# Patient Record
Sex: Female | Born: 1935 | Race: White | Hispanic: No | Smoking: Never smoker
Health system: Southern US, Community
[De-identification: ages and names within clinical notes are randomized; demographics above are authoritative.]

## PROBLEM LIST (undated history)

## (undated) DIAGNOSIS — E119 Type 2 diabetes mellitus without complications: Secondary | ICD-10-CM

## (undated) DIAGNOSIS — J439 Emphysema, unspecified: Secondary | ICD-10-CM

## (undated) DIAGNOSIS — E079 Disorder of thyroid, unspecified: Secondary | ICD-10-CM

## (undated) DIAGNOSIS — N390 Urinary tract infection, site not specified: Secondary | ICD-10-CM

## (undated) DIAGNOSIS — I10 Essential (primary) hypertension: Secondary | ICD-10-CM

## (undated) HISTORY — PX: REPLACEMENT TOTAL KNEE: SUR1224

## (undated) HISTORY — DX: Emphysema, unspecified: J43.9

## (undated) HISTORY — DX: Disorder of thyroid, unspecified: E07.9

---

## 1997-09-08 ENCOUNTER — Emergency Department (HOSPITAL_COMMUNITY): Admission: EM | Admit: 1997-09-08 | Discharge: 1997-09-08 | Payer: Self-pay | Admitting: Emergency Medicine

## 1998-04-15 ENCOUNTER — Encounter: Payer: Self-pay | Admitting: Family Medicine

## 1998-04-15 ENCOUNTER — Ambulatory Visit (HOSPITAL_COMMUNITY): Admission: RE | Admit: 1998-04-15 | Discharge: 1998-04-15 | Payer: Self-pay | Admitting: Family Medicine

## 1998-12-03 ENCOUNTER — Emergency Department (HOSPITAL_COMMUNITY): Admission: EM | Admit: 1998-12-03 | Discharge: 1998-12-03 | Payer: Self-pay | Admitting: *Deleted

## 1999-05-12 ENCOUNTER — Emergency Department (HOSPITAL_COMMUNITY): Admission: EM | Admit: 1999-05-12 | Discharge: 1999-05-12 | Payer: Self-pay

## 1999-05-14 ENCOUNTER — Emergency Department (HOSPITAL_COMMUNITY): Admission: EM | Admit: 1999-05-14 | Discharge: 1999-05-14 | Payer: Self-pay | Admitting: Emergency Medicine

## 1999-07-09 ENCOUNTER — Emergency Department (HOSPITAL_COMMUNITY): Admission: EM | Admit: 1999-07-09 | Discharge: 1999-07-09 | Payer: Self-pay | Admitting: Emergency Medicine

## 1999-07-09 ENCOUNTER — Encounter: Payer: Self-pay | Admitting: Emergency Medicine

## 2000-01-09 ENCOUNTER — Encounter: Payer: Self-pay | Admitting: Internal Medicine

## 2000-01-09 ENCOUNTER — Emergency Department (HOSPITAL_COMMUNITY): Admission: EM | Admit: 2000-01-09 | Discharge: 2000-01-09 | Payer: Self-pay | Admitting: Internal Medicine

## 2000-01-20 ENCOUNTER — Emergency Department (HOSPITAL_COMMUNITY): Admission: EM | Admit: 2000-01-20 | Discharge: 2000-01-20 | Payer: Self-pay | Admitting: *Deleted

## 2000-08-12 ENCOUNTER — Emergency Department (HOSPITAL_COMMUNITY): Admission: EM | Admit: 2000-08-12 | Discharge: 2000-08-12 | Payer: Self-pay | Admitting: Emergency Medicine

## 2000-12-03 ENCOUNTER — Emergency Department (HOSPITAL_COMMUNITY): Admission: EM | Admit: 2000-12-03 | Discharge: 2000-12-03 | Payer: Self-pay | Admitting: Emergency Medicine

## 2001-01-26 ENCOUNTER — Encounter: Payer: Self-pay | Admitting: Emergency Medicine

## 2001-01-26 ENCOUNTER — Emergency Department (HOSPITAL_COMMUNITY): Admission: EM | Admit: 2001-01-26 | Discharge: 2001-01-26 | Payer: Self-pay | Admitting: *Deleted

## 2001-09-27 ENCOUNTER — Emergency Department (HOSPITAL_COMMUNITY): Admission: EM | Admit: 2001-09-27 | Discharge: 2001-09-27 | Payer: Self-pay | Admitting: Emergency Medicine

## 2001-09-27 ENCOUNTER — Encounter: Payer: Self-pay | Admitting: Emergency Medicine

## 2001-10-26 ENCOUNTER — Ambulatory Visit (HOSPITAL_COMMUNITY): Admission: RE | Admit: 2001-10-26 | Discharge: 2001-10-26 | Payer: Self-pay | Admitting: Family Medicine

## 2002-04-07 ENCOUNTER — Encounter: Payer: Self-pay | Admitting: Emergency Medicine

## 2002-04-07 ENCOUNTER — Emergency Department (HOSPITAL_COMMUNITY): Admission: EM | Admit: 2002-04-07 | Discharge: 2002-04-07 | Payer: Self-pay | Admitting: Emergency Medicine

## 2003-03-30 ENCOUNTER — Emergency Department (HOSPITAL_COMMUNITY): Admission: EM | Admit: 2003-03-30 | Discharge: 2003-03-30 | Payer: Self-pay | Admitting: Emergency Medicine

## 2005-12-14 ENCOUNTER — Emergency Department (HOSPITAL_COMMUNITY): Admission: EM | Admit: 2005-12-14 | Discharge: 2005-12-14 | Payer: Self-pay | Admitting: Emergency Medicine

## 2006-02-01 ENCOUNTER — Emergency Department (HOSPITAL_COMMUNITY): Admission: EM | Admit: 2006-02-01 | Discharge: 2006-02-01 | Payer: Self-pay | Admitting: Emergency Medicine

## 2007-03-14 ENCOUNTER — Ambulatory Visit: Payer: Self-pay | Admitting: Cardiology

## 2007-03-14 ENCOUNTER — Inpatient Hospital Stay (HOSPITAL_COMMUNITY): Admission: EM | Admit: 2007-03-14 | Discharge: 2007-03-15 | Payer: Self-pay | Admitting: Emergency Medicine

## 2007-03-15 ENCOUNTER — Encounter (INDEPENDENT_AMBULATORY_CARE_PROVIDER_SITE_OTHER): Payer: Self-pay | Admitting: Internal Medicine

## 2008-03-20 ENCOUNTER — Emergency Department (HOSPITAL_COMMUNITY): Admission: EM | Admit: 2008-03-20 | Discharge: 2008-03-20 | Payer: Self-pay | Admitting: Emergency Medicine

## 2008-06-08 ENCOUNTER — Encounter: Admission: RE | Admit: 2008-06-08 | Discharge: 2008-06-08 | Payer: Self-pay | Admitting: Family Medicine

## 2008-10-12 ENCOUNTER — Encounter: Admission: RE | Admit: 2008-10-12 | Discharge: 2008-10-12 | Payer: Self-pay | Admitting: Orthopedic Surgery

## 2008-11-21 ENCOUNTER — Ambulatory Visit (HOSPITAL_COMMUNITY): Admission: RE | Admit: 2008-11-21 | Discharge: 2008-11-21 | Payer: Self-pay | Admitting: Orthopedic Surgery

## 2008-12-31 ENCOUNTER — Encounter: Admission: RE | Admit: 2008-12-31 | Discharge: 2009-03-11 | Payer: Self-pay | Admitting: Orthopedic Surgery

## 2009-10-24 ENCOUNTER — Inpatient Hospital Stay (HOSPITAL_COMMUNITY): Admission: RE | Admit: 2009-10-24 | Discharge: 2009-10-31 | Payer: Self-pay | Admitting: Orthopedic Surgery

## 2009-12-10 ENCOUNTER — Encounter: Admission: RE | Admit: 2009-12-10 | Discharge: 2009-12-25 | Payer: Self-pay | Admitting: Orthopedic Surgery

## 2010-04-21 ENCOUNTER — Encounter: Payer: Self-pay | Admitting: Orthopedic Surgery

## 2010-06-13 LAB — GLUCOSE, CAPILLARY
Glucose-Capillary: 117 mg/dL — ABNORMAL HIGH (ref 70–99)
Glucose-Capillary: 136 mg/dL — ABNORMAL HIGH (ref 70–99)
Glucose-Capillary: 141 mg/dL — ABNORMAL HIGH (ref 70–99)
Glucose-Capillary: 145 mg/dL — ABNORMAL HIGH (ref 70–99)
Glucose-Capillary: 147 mg/dL — ABNORMAL HIGH (ref 70–99)
Glucose-Capillary: 148 mg/dL — ABNORMAL HIGH (ref 70–99)
Glucose-Capillary: 150 mg/dL — ABNORMAL HIGH (ref 70–99)
Glucose-Capillary: 169 mg/dL — ABNORMAL HIGH (ref 70–99)
Glucose-Capillary: 186 mg/dL — ABNORMAL HIGH (ref 70–99)

## 2010-06-13 LAB — CBC
HCT: 28.2 % — ABNORMAL LOW (ref 36.0–46.0)
Hemoglobin: 10 g/dL — ABNORMAL LOW (ref 12.0–15.0)
MCH: 32.9 pg (ref 26.0–34.0)
MCHC: 35.5 g/dL (ref 30.0–36.0)
MCV: 92.8 fL (ref 78.0–100.0)
Platelets: 221 10*3/uL (ref 150–400)
RBC: 3.04 MIL/uL — ABNORMAL LOW (ref 3.87–5.11)
RDW: 12.8 % (ref 11.5–15.5)
WBC: 6.7 10*3/uL (ref 4.0–10.5)

## 2010-06-13 LAB — COMPREHENSIVE METABOLIC PANEL
ALT: 20 U/L (ref 0–35)
AST: 18 U/L (ref 0–37)
Albumin: 2.8 g/dL — ABNORMAL LOW (ref 3.5–5.2)
Alkaline Phosphatase: 48 U/L (ref 39–117)
BUN: 12 mg/dL (ref 6–23)
CO2: 32 mEq/L (ref 19–32)
Calcium: 8.7 mg/dL (ref 8.4–10.5)
Chloride: 100 mEq/L (ref 96–112)
Creatinine, Ser: 0.77 mg/dL (ref 0.4–1.2)
GFR calc Af Amer: >60 mL/min (ref >60)
GFR calc non Af Amer: >60 mL/min (ref >60)
Glucose, Bld: 154 mg/dL — ABNORMAL HIGH (ref 70–99)
Potassium: 4.1 mEq/L (ref 3.5–5.1)
Sodium: 138 mEq/L (ref 135–145)
Total Bilirubin: 0.7 mg/dL (ref 0.3–1.2)
Total Protein: 6.3 g/dL (ref 6.0–8.3)

## 2010-06-13 LAB — MAGNESIUM: Magnesium: 2.3 mg/dL (ref 1.5–2.5)

## 2010-06-13 LAB — PROTIME-INR
INR: 1.51 — ABNORMAL HIGH (ref 0.00–1.49)
INR: 1.75 — ABNORMAL HIGH (ref 0.00–1.49)
INR: 1.97 — ABNORMAL HIGH (ref 0.00–1.49)
Prothrombin Time: 18.1 seconds — ABNORMAL HIGH (ref 11.6–15.2)
Prothrombin Time: 20.3 seconds — ABNORMAL HIGH (ref 11.6–15.2)

## 2010-06-14 LAB — CBC
HCT: 31.1 % — ABNORMAL LOW (ref 36.0–46.0)
Hemoglobin: 10.4 g/dL — ABNORMAL LOW (ref 12.0–15.0)
Hemoglobin: 14.1 g/dL (ref 12.0–15.0)
MCH: 32 pg (ref 26.0–34.0)
MCH: 32.1 pg (ref 26.0–34.0)
MCH: 32.3 pg (ref 26.0–34.0)
MCHC: 34.2 g/dL (ref 30.0–36.0)
MCHC: 35 g/dL (ref 30.0–36.0)
MCV: 92.9 fL (ref 78.0–100.0)
Platelets: 160 10*3/uL (ref 150–400)
Platelets: 180 10*3/uL (ref 150–400)
RBC: 3.25 MIL/uL — ABNORMAL LOW (ref 3.87–5.11)
RBC: 4.37 MIL/uL (ref 3.87–5.11)
RDW: 12.6 % (ref 11.5–15.5)
RDW: 12.9 % (ref 11.5–15.5)
WBC: 7.6 10*3/uL (ref 4.0–10.5)

## 2010-06-14 LAB — URINE MICROSCOPIC-ADD ON

## 2010-06-14 LAB — URINALYSIS, ROUTINE W REFLEX MICROSCOPIC
Bilirubin Urine: NEGATIVE
Ketones, ur: NEGATIVE mg/dL
Nitrite: NEGATIVE
Protein, ur: NEGATIVE mg/dL
pH: 7 (ref 5.0–8.0)

## 2010-06-14 LAB — COMPREHENSIVE METABOLIC PANEL
ALT: 17 U/L (ref 0–35)
AST: 21 U/L (ref 0–37)
AST: 22 U/L (ref 0–37)
AST: 23 U/L (ref 0–37)
Albumin: 3.1 g/dL — ABNORMAL LOW (ref 3.5–5.2)
Albumin: 3.1 g/dL — ABNORMAL LOW (ref 3.5–5.2)
Albumin: 3.2 g/dL — ABNORMAL LOW (ref 3.5–5.2)
Alkaline Phosphatase: 45 U/L (ref 39–117)
Alkaline Phosphatase: 56 U/L (ref 39–117)
BUN: 6 mg/dL (ref 6–23)
BUN: 9 mg/dL (ref 6–23)
CO2: 30 mEq/L (ref 19–32)
CO2: 30 mEq/L (ref 19–32)
CO2: 34 mEq/L — ABNORMAL HIGH (ref 19–32)
Calcium: 8.7 mg/dL (ref 8.4–10.5)
Chloride: 100 mEq/L (ref 96–112)
Chloride: 104 mEq/L (ref 96–112)
Chloride: 97 mEq/L (ref 96–112)
Creatinine, Ser: 0.79 mg/dL (ref 0.4–1.2)
Creatinine, Ser: 0.8 mg/dL (ref 0.4–1.2)
GFR calc Af Amer: >60 mL/min (ref >60)
GFR calc Af Amer: >60 mL/min (ref >60)
GFR calc Af Amer: >60 mL/min (ref >60)
GFR calc non Af Amer: 58 mL/min — ABNORMAL LOW (ref >60)
GFR calc non Af Amer: >60 mL/min (ref >60)
GFR calc non Af Amer: >60 mL/min (ref >60)
Potassium: 3.3 mEq/L — ABNORMAL LOW (ref 3.5–5.1)
Sodium: 140 mEq/L (ref 135–145)
Total Bilirubin: 0.7 mg/dL (ref 0.3–1.2)
Total Bilirubin: 0.8 mg/dL (ref 0.3–1.2)
Total Bilirubin: 0.8 mg/dL (ref 0.3–1.2)
Total Protein: 6.7 g/dL (ref 6.0–8.3)

## 2010-06-14 LAB — GLUCOSE, CAPILLARY
Glucose-Capillary: 149 mg/dL — ABNORMAL HIGH (ref 70–99)
Glucose-Capillary: 165 mg/dL — ABNORMAL HIGH (ref 70–99)
Glucose-Capillary: 170 mg/dL — ABNORMAL HIGH (ref 70–99)
Glucose-Capillary: 170 mg/dL — ABNORMAL HIGH (ref 70–99)
Glucose-Capillary: 172 mg/dL — ABNORMAL HIGH (ref 70–99)
Glucose-Capillary: 173 mg/dL — ABNORMAL HIGH (ref 70–99)
Glucose-Capillary: 175 mg/dL — ABNORMAL HIGH (ref 70–99)
Glucose-Capillary: 223 mg/dL — ABNORMAL HIGH (ref 70–99)

## 2010-06-14 LAB — DIFFERENTIAL
Basophils Absolute: 0 10*3/uL (ref 0.0–0.1)
Basophils Relative: 0 % (ref 0–1)
Eosinophils Relative: 1 % (ref 0–5)
Eosinophils Relative: 1 % (ref 0–5)
Lymphocytes Relative: 25 % (ref 12–46)
Lymphs Abs: 1.6 10*3/uL (ref 0.7–4.0)
Monocytes Absolute: 0.9 10*3/uL (ref 0.1–1.0)
Monocytes Relative: 10 % (ref 3–12)

## 2010-06-14 LAB — PROTIME-INR
INR: 1.19 (ref 0.00–1.49)
Prothrombin Time: 15 seconds (ref 11.6–15.2)

## 2010-06-14 LAB — MAGNESIUM
Magnesium: 1.9 mg/dL (ref 1.5–2.5)
Magnesium: 2.1 mg/dL (ref 1.5–2.5)

## 2010-06-14 LAB — SURGICAL PCR SCREEN: MRSA, PCR: NEGATIVE

## 2010-07-05 LAB — BASIC METABOLIC PANEL
CO2: 31 mEq/L (ref 19–32)
Glucose, Bld: 105 mg/dL — ABNORMAL HIGH (ref 70–99)
Potassium: 3.9 mEq/L (ref 3.5–5.1)
Sodium: 142 mEq/L (ref 135–145)

## 2010-07-05 LAB — COMPREHENSIVE METABOLIC PANEL
AST: 22 U/L (ref 0–37)
BUN: 7 mg/dL (ref 6–23)
CO2: 31 mEq/L (ref 19–32)
Chloride: 103 mEq/L (ref 96–112)
Creatinine, Ser: 0.81 mg/dL (ref 0.4–1.2)
GFR calc Af Amer: >60 mL/min (ref >60)
GFR calc non Af Amer: >60 mL/min (ref >60)
Total Bilirubin: 1 mg/dL (ref 0.3–1.2)

## 2010-07-05 LAB — CBC
HCT: 38.5 % (ref 36.0–46.0)
Hemoglobin: 13.2 g/dL (ref 12.0–15.0)
MCHC: 34.3 g/dL (ref 30.0–36.0)
RDW: 12.9 % (ref 11.5–15.5)

## 2010-08-12 NOTE — Op Note (Signed)
NAMEDEVONA, HOLMES NO.:  [id number]   MEDICAL RECORD NO.:  [id number]          PATIENT TYPE:  AMB   LOCATION:  SDS                          FACILITY:  [facility]   PHYSICIAN:  Myrtie Neither, MD      DATE OF BIRTH:  Aug 16, 1934   DATE OF PROCEDURE:  11/21/2008  DATE OF DISCHARGE:  11/21/2008                               OPERATIVE REPORT   PREOPERATIVE DIAGNOSIS:  Lateral meniscal tear left knee.   POSTOPERATIVE DIAGNOSIS:  Lateral meniscal tear left knee, patellar  plica left knee, chondral defect lateral femoral condyle.   ANESTHESIA:  General.   PROCEDURE:  1. Arthroscopic partial lateral meniscectomy.  2. Excision of plica and synovectomy medial and lateral compartment.  3. Chondroplasty of lateral femoral condyle.   DESCRIPTION:  The patient was taken to the operating room after given  adequate preop medications, given general anesthesia and intubated.  The  left knee was prepped with DuraPrep and draped in a sterile manner.  Tourniquet used for hemostasis.  One half inch puncture wound was made  along the anterior medial and lateral joint line.  Inflow was through  the medial suprapatellar pouch area.  Inspection of the joint revealed  degenerative tear of the lateral meniscus along the posterior horn all  the way round to the lateral aspect of the lateral meniscus.  There was  a chondral defect approximately the size of a five-cent piece involving  the lateral femoral condyle.  There was a large patellar plica with  thickening of the synovial lining both in the medial and lateral  compartments.  With the use of synovial shaver, an excision of plica and  complete synovectomy was done followed by use of basket forceps for the  lateral meniscectomy.  Chondroplasty with a shaver was done of the  lateral femoral condyle.  ACL was intact and medial compartment was well  preserved.  Further copious irrigation was done.  Wound closure was done  with 4-0 nylon.   14 mL of plain Marcaine 0.25% was injected into the  knee.  Compressive dressing was applied.  The patient tolerated the  procedure quite well and went to recovery room in stable and  satisfactory condition.  The patient being discharged home on Percocet 1-  2 q.6 p.r.n. for pain, ice packs, crutches with partial weightbearing on  the left side and to return to the office in 1 week.  The patient being  discharged in stable and satisfactory condition.      Myrtie Neither, MD  Electronically Signed     AC/MEDQ  D:  11/21/2008  T:  11/22/2008  Job:  [id number]

## 2010-08-12 NOTE — Discharge Summary (Signed)
[name], [name] NO.:  [id number]   MEDICAL RECORD NO.:  [id number]          PATIENT TYPE:  INP   LOCATION:  4735                         FACILITY:  [facility]   PHYSICIAN:  Lonia Blood, M.D.       DATE OF BIRTH:  06/28/2004   DATE OF ADMISSION:  03/14/2007  DATE OF DISCHARGE:                               DISCHARGE SUMMARY   PRIMARY CARE PHYSICIAN:  Renaye Rakers, M.D.   DISCHARGE DIAGNOSIS:  1. Syncope most likely secondary to orthostasis.  2. Acute bronchitis with hyperreactive airways.  3. Status post hysterectomy.  4. Newly diagnosed diabetes mellitus type 2.  5. Hyperlipidemia.  6. Obesity.   DISCHARGE MEDICATIONS:  1. Albuterol 1 puff 3 times a day.  2. Doxycycline 100 mg daily.  3. Prilosec OTC 20 mg daily.   CONDITION ON DISCHARGE:  [name] was discharge in good condition.  She  was instructed to follow-up a diabetic diet.  She instructed to follow  up with her primary care physician as previously scheduled.  The patient  was provided with a prescription for Glucometer and she was set up with  diabetes education.   PROCEDURE THIS ADMISSION:  A transthoracic echocardiogram was done,  results pending.  CT of the head was done with negative results.   CONSULTATION:  No consultations obtained.   HISTORY AND PHYSICAL:  Refer to dictated H&P done by Dr. Sharon Seller.   HOSPITAL COURSE:  1. Syncope.  [name] was observed for 24 hours on telemetry and she      had no arrhythmias.  Her orthostasis has resolved completely.  The      dehydration has resolved completely.  The patient did not have any      further syncopal events and her cardiac enzymes were within normal      limits.  She did not display signs of a stroke.  2. Newly diagnosed diabetes mellitus.  This seems to be quite mild.      The patient's measured hemoglobin A1c 6.7.  She will follow up with      her primary care physician in 2 weeks to decide if she requires      treatment with  medications or if she should just follow a diet      alone.  The patient was set up with outpatient education as well as      with home Glucometer.  3. Hyperreactive airways. [name] is suffering from acute bronchitis      with hyperreactive airways.  She does not have pneumonia.  She      needs to be treated with albuterol MDIs as well as antibiotics.      She is to follow up with her primary care physician for pulmonary      function testing and treatment as needed.      Lonia Blood, M.D.  Electronically Signed     SL/MEDQ  D:  03/15/2007  T:  03/16/2007  Job:  [id number]   cc:   Renaye Rakers, M.D.

## 2010-08-12 NOTE — H&P (Signed)
[name], GAME NO.:  [id number]   MEDICAL RECORD NO.:  [id number]          PATIENT TYPE:  EMS   LOCATION:  MAJO                         FACILITY:  [facility]   PHYSICIAN:  Lonia Blood, M.D.DATE OF BIRTH:  October 28, 1934   DATE OF ADMISSION:  03/14/2007  DATE OF DISCHARGE:                              HISTORY & PHYSICAL   PRIMARY CARE PHYSICIAN:  Renaye Rakers, M.D.   CHIEF COMPLAINT:  Syncope.   HISTORY OF PRESENT ILLNESS:  [name] is a very pleasant 76 year old  female with a remarkably clean past medical history.  Approximately one  week ago, she was diagnosed with community-acquired pneumonia in her  primary care physician's office.  The patient was treated for this with  a p.o. antibiotic, whose full course she has completed.  Over the  weekend, she recuperated and felt that she was much improved.  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  Upon arriving at work, she began to feel somewhat  weak in general.  While working this week, this worsened.  The patient  began to feel hot and diaphoretic.  She went to the restroom to  recuperate and shortly thereafter, she was found passed out in the floor  by her coworkers.  The patient had no prodromal symptoms.  We are not  sure how long the patient was out, but there is no indication that it  was an extended period of time.  There are no findings nor is there  historical evidence to suggest the patient suffered a seizure-type  activity.  Upon awakening, the patient immediately knew where she was.  She has had no chest pain, nausea, vomiting, or abdominal pain since the  onset of her symptoms.  She does admit to feeling warm on weekends but  does say that she was pushing liquids and solids despite this.  She was  transported to the emergency room at [facility], where she was found to  be mildly febrile but otherwise has been medically stable.  She has no  complaints at the present time other than feeling tired.   REVIEW OF SYSTEMS:  Comprehensive review of systems is unremarkable.   PAST MEDICAL HISTORY:  1. Status post hysterectomy.  2. History of left arm fracture, status post fall, requiring inpatient      treatment multiple years ago.  3. No chronic medical diagnoses.   OUTPATIENT MEDICATIONS:  None.   ALLERGIES:  ASPIRIN causes GI upset.   FAMILY HISTORY:  Reviewed but is unremarkable.   SOCIAL HISTORY:  Patient does not smoke.  She does not drink.  She lives  in [place].  She lives with her children.  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   REVIEW OF SYSTEMS:  Patient did not get a flu shot this year.   DATA REVIEWED:  CBC is unremarkable.  BMET is unremarkable except for  elevated BUN at 22 and an elevated serum glucose at 146.   CT scan of the head reveals  no acute disease.  CT scan of the left  shoulder reveals no acute disease.  Chest x-ray reveals no focal  infiltrate.   PHYSICAL EXAMINATION:  Temperature 98.5, blood pressure 118/58, heart  rate 72, respiratory rate 20, O2 sat is 94% on room air.  GENERAL:  A well-developed and well-nourished female in no acute  respiratory distress.  HEENT:  Normocephalic and atraumatic.  Pupils are equal, round and  reactive to light and accommodation.  Extraocular muscles are intact  bilaterally.  OC/OP clear.  NECK:  No JVD.  No lymphadenopathy.  No thyromegaly.  LUNGS:  Clear to auscultation throughout all fields without focal  wheezes or crackles.  CARDIOVASCULAR:  Regular rate and rhythm without murmur, rub or gallop  with normal S1 and S2.  ABDOMEN:  Nontender, nondistended.  Soft.  Bowel sounds present.  No  hepatosplenomegaly.  No rebound.  No ascites.  EXTREMITIES:  No significant clubbing, cyanosis or edema in bilateral  lower extremities.  NEUROLOGIC:  Alert and oriented x4.  Cranial nerves II-XII are intact  bilaterally.  Strength is  5/5 in bilateral upper and lower extremities.  Intact sensation to touch throughout.  No Babinski's.   IMPRESSION/PLAN:  1. Syncope:  This likely represented an orthostatic or even vasovagal-      type syncope.  The patient appears to be suffering with a viral      syndrome at the present time.  The low grade fever that has been      associated with this has likely led to significant and sensitive      fluid loss and has resulted in her clinically apparent dehydration.      I feel this is to blame for the patient's syncope.  We will rule      the patient out as a precaution.  I will check an echocardiogram to      evaluate her LV systolic function.  Further evaluation will be      carried out as appropriate.  We will hydrate the patient      aggressively and follow her clinically.  Orthostatics will be      obtained in the morning.  2. Upper respiratory symptoms:  If the patient had a pneumonia last      week, it has been completely treated with her antibiotic.  Chest x-      ray is now completely clean.  My concern is that this could      represent a viral upper respiratory picture versus even an      influenza.  We will do a nasal swab for influenza.  We will hydrate      the patient aggressively.  We will use nebulizer to attempt to      assist with her cough.  3. Hyperglycemia:  Patient has no history of diabetes.  She is      somewhat hyperglycemic with a serum glucose of 146.  We will check      a hemoglobin A1C and will pursue this further as indicated.      Lonia Blood, M.D.  Electronically Signed     JTM/MEDQ  D:  03/14/2007  T:  03/14/2007  Job:  [id number]   cc:   Renaye Rakers, M.D.

## 2011-01-02 LAB — POCT I-STAT CREATININE
Creatinine, Ser: 0.9
Operator id: 151321

## 2011-01-02 LAB — LIPID PANEL
Cholesterol: 168
HDL: 28 — ABNORMAL LOW
Total CHOL/HDL Ratio: 6

## 2011-01-02 LAB — BASIC METABOLIC PANEL
CO2: 27
Calcium: 8.5
Chloride: 108
Creatinine, Ser: 0.81
Glucose, Bld: 125 — ABNORMAL HIGH
Sodium: 141

## 2011-01-02 LAB — URINE MICROSCOPIC-ADD ON

## 2011-01-02 LAB — HEMOGLOBIN A1C
Hgb A1c MFr Bld: 6.7 — ABNORMAL HIGH
Mean Plasma Glucose: 161

## 2011-01-02 LAB — CBC
MCHC: 34.4
Platelets: 254
RBC: 4.21
RDW: 12.9

## 2011-01-02 LAB — URINALYSIS, ROUTINE W REFLEX MICROSCOPIC
Glucose, UA: NEGATIVE
Hgb urine dipstick: NEGATIVE
Ketones, ur: NEGATIVE
Protein, ur: NEGATIVE
pH: 7

## 2011-01-02 LAB — DIFFERENTIAL
Basophils Absolute: 0.1
Basophils Relative: 1
Monocytes Relative: 6
Neutro Abs: 6.5
Neutrophils Relative %: 75

## 2011-01-02 LAB — MAGNESIUM: Magnesium: 2.3

## 2011-01-02 LAB — CARDIAC PANEL(CRET KIN+CKTOT+MB+TROPI)
Relative Index: INVALID
Total CK: 82
Total CK: 88
Troponin I: 0.01

## 2011-01-02 LAB — I-STAT 8, (EC8 V) (CONVERTED LAB)
Acid-Base Excess: 6 — ABNORMAL HIGH
Bicarbonate: 28.4 — ABNORMAL HIGH
HCT: 40
Operator id: 151321
TCO2: 29
pCO2, Ven: 31.7 — ABNORMAL LOW

## 2011-01-02 LAB — URINE CULTURE: Colony Count: 25000

## 2012-06-09 ENCOUNTER — Encounter (HOSPITAL_COMMUNITY): Payer: Self-pay | Admitting: Emergency Medicine

## 2012-06-09 ENCOUNTER — Inpatient Hospital Stay (HOSPITAL_COMMUNITY)
Admission: EM | Admit: 2012-06-09 | Discharge: 2012-06-12 | DRG: 690 | Disposition: A | Discharge status: Home Health | Payer: Medicare HMO | Source: Ambulatory Visit | Attending: Internal Medicine | Admitting: Internal Medicine

## 2012-06-09 DIAGNOSIS — Z886 Allergy status to analgesic agent status: Secondary | ICD-10-CM

## 2012-06-09 DIAGNOSIS — E860 Dehydration: Secondary | ICD-10-CM | POA: Diagnosis present

## 2012-06-09 DIAGNOSIS — N390 Urinary tract infection, site not specified: Secondary | ICD-10-CM | POA: Diagnosis present

## 2012-06-09 DIAGNOSIS — Z96659 Presence of unspecified artificial knee joint: Secondary | ICD-10-CM

## 2012-06-09 DIAGNOSIS — I10 Essential (primary) hypertension: Secondary | ICD-10-CM | POA: Diagnosis present

## 2012-06-09 DIAGNOSIS — Z792 Long term (current) use of antibiotics: Secondary | ICD-10-CM

## 2012-06-09 DIAGNOSIS — Z79899 Other long term (current) drug therapy: Secondary | ICD-10-CM

## 2012-06-09 DIAGNOSIS — R5381 Other malaise: Secondary | ICD-10-CM | POA: Diagnosis present

## 2012-06-09 DIAGNOSIS — N10 Acute tubulo-interstitial nephritis: Principal | ICD-10-CM | POA: Diagnosis present

## 2012-06-09 DIAGNOSIS — E119 Type 2 diabetes mellitus without complications: Secondary | ICD-10-CM | POA: Diagnosis present

## 2012-06-09 HISTORY — DX: Essential (primary) hypertension: I10

## 2012-06-09 HISTORY — DX: Urinary tract infection, site not specified: N39.0

## 2012-06-09 HISTORY — DX: Type 2 diabetes mellitus without complications: E11.9

## 2012-06-09 LAB — CBC WITH DIFFERENTIAL/PLATELET
Eosinophils Absolute: 0 10*3/uL (ref 0.0–0.7)
Hemoglobin: 12.5 g/dL (ref 12.0–15.0)
Lymphs Abs: 0.7 10*3/uL (ref 0.7–4.0)
MCH: 31.2 pg (ref 26.0–34.0)
Monocytes Relative: 12 % (ref 3–12)
Neutro Abs: 8.3 10*3/uL — ABNORMAL HIGH (ref 1.7–7.7)
Neutrophils Relative %: 81 % — ABNORMAL HIGH (ref 43–77)
Platelets: 152 10*3/uL (ref 150–400)
RBC: 4.01 MIL/uL (ref 3.87–5.11)
WBC: 10.3 10*3/uL (ref 4.0–10.5)

## 2012-06-09 LAB — GLUCOSE, CAPILLARY
Glucose-Capillary: 125 mg/dL — ABNORMAL HIGH (ref 70–99)
Glucose-Capillary: 141 mg/dL — ABNORMAL HIGH (ref 70–99)

## 2012-06-09 LAB — CBC
HCT: 33.7 % — ABNORMAL LOW (ref 36.0–46.0)
Hemoglobin: 11.7 g/dL — ABNORMAL LOW (ref 12.0–15.0)
MCH: 31.7 pg (ref 26.0–34.0)
MCHC: 34.7 g/dL (ref 30.0–36.0)
MCV: 91.3 fL (ref 78.0–100.0)
RBC: 3.69 MIL/uL — ABNORMAL LOW (ref 3.87–5.11)

## 2012-06-09 LAB — URINE MICROSCOPIC-ADD ON

## 2012-06-09 LAB — COMPREHENSIVE METABOLIC PANEL
ALT: 29 U/L (ref 0–35)
Albumin: 3.5 g/dL (ref 3.5–5.2)
Alkaline Phosphatase: 79 U/L (ref 39–117)
BUN: 19 mg/dL (ref 6–23)
Chloride: 96 mEq/L (ref 96–112)
GFR calc Af Amer: 75 mL/min — ABNORMAL LOW (ref >90)
Glucose, Bld: 158 mg/dL — ABNORMAL HIGH (ref 70–99)
Potassium: 3.7 mEq/L (ref 3.5–5.1)
Sodium: 134 mEq/L — ABNORMAL LOW (ref 135–145)
Total Bilirubin: 1.4 mg/dL — ABNORMAL HIGH (ref 0.3–1.2)
Total Protein: 7.6 g/dL (ref 6.0–8.3)

## 2012-06-09 LAB — URINALYSIS, ROUTINE W REFLEX MICROSCOPIC
Glucose, UA: 100 mg/dL — AB
Ketones, ur: >80 mg/dL — AB
Protein, ur: 100 mg/dL — AB
Urobilinogen, UA: 1 mg/dL (ref 0.0–1.0)

## 2012-06-09 LAB — HEMOGLOBIN A1C
Hgb A1c MFr Bld: 7 % — ABNORMAL HIGH (ref <5.7)
Mean Plasma Glucose: 154 mg/dL — ABNORMAL HIGH (ref <117)

## 2012-06-09 MED ORDER — ONDANSETRON HCL 4 MG/2ML IJ SOLN: 4.0000 mg | Freq: Four times a day (QID) | INTRAMUSCULAR | Status: DC | PRN

## 2012-06-09 MED ORDER — HYDROCODONE-ACETAMINOPHEN 5-325 MG PO TABS
1.0000 | ORAL_TABLET | ORAL | Status: DC | PRN
  Administered 2012-06-10: 1 via ORAL
  Filled 2012-06-09: qty 1

## 2012-06-09 MED ORDER — HYDROCHLOROTHIAZIDE 12.5 MG PO CAPS
12.5000 mg | ORAL_CAPSULE | Freq: Every day | ORAL | Status: DC
  Administered 2012-06-10: 12.5 mg via ORAL
  Filled 2012-06-09: qty 1

## 2012-06-09 MED ORDER — DEXTROSE 5 % IV SOLN
1.0000 g | INTRAVENOUS | Status: DC
  Administered 2012-06-09 – 2012-06-10 (×2): 1 g via INTRAVENOUS
  Filled 2012-06-09 (×4): qty 10

## 2012-06-09 MED ORDER — LISINOPRIL 20 MG PO TABS
20.0000 mg | ORAL_TABLET | Freq: Every day | ORAL | Status: DC
  Administered 2012-06-10 – 2012-06-12 (×3): 20 mg via ORAL
  Filled 2012-06-09 (×5): qty 1

## 2012-06-09 MED ORDER — SODIUM CHLORIDE 0.9 % IV SOLN
INTRAVENOUS | Status: DC
  Administered 2012-06-09: 14:00:00 via INTRAVENOUS

## 2012-06-09 MED ORDER — SODIUM CHLORIDE 0.9 % IV SOLN
INTRAVENOUS | Status: DC
  Administered 2012-06-09: 75 mL/h via INTRAVENOUS
  Administered 2012-06-09 – 2012-06-11 (×4): via INTRAVENOUS

## 2012-06-09 MED ORDER — ACETAMINOPHEN 325 MG PO TABS
650.0000 mg | ORAL_TABLET | Freq: Four times a day (QID) | ORAL | Status: DC | PRN
  Administered 2012-06-10 (×2): 650 mg via ORAL
  Filled 2012-06-09 (×2): qty 2

## 2012-06-09 MED ORDER — SODIUM CHLORIDE 0.9 % IV SOLN: INTRAVENOUS | Status: DC

## 2012-06-09 MED ORDER — INSULIN ASPART 100 UNIT/ML ~~LOC~~ SOLN
0.0000 [IU] | Freq: Three times a day (TID) | SUBCUTANEOUS | Status: DC
  Administered 2012-06-09: 1 [IU] via SUBCUTANEOUS
  Administered 2012-06-10 – 2012-06-11 (×2): 2 [IU] via SUBCUTANEOUS
  Administered 2012-06-11 – 2012-06-12 (×3): 1 [IU] via SUBCUTANEOUS

## 2012-06-09 MED ORDER — GUAIFENESIN-DM 100-10 MG/5ML PO SYRP
5.0000 mL | ORAL_SOLUTION | ORAL | Status: DC | PRN
  Filled 2012-06-09: qty 5

## 2012-06-09 MED ORDER — ONDANSETRON HCL 4 MG PO TABS: 4.0000 mg | ORAL_TABLET | Freq: Four times a day (QID) | ORAL | Status: DC | PRN

## 2012-06-09 MED ORDER — ENOXAPARIN SODIUM 40 MG/0.4ML ~~LOC~~ SOLN
40.0000 mg | SUBCUTANEOUS | Status: DC
  Administered 2012-06-09 – 2012-06-11 (×3): 40 mg via SUBCUTANEOUS
  Filled 2012-06-09 (×6): qty 0.4

## 2012-06-09 MED ORDER — INSULIN ASPART 100 UNIT/ML ~~LOC~~ SOLN: 0.0000 [IU] | Freq: Every day | SUBCUTANEOUS | Status: DC

## 2012-06-09 MED ORDER — HYDROMORPHONE HCL PF 1 MG/ML IJ SOLN: 1.0000 mg | INTRAMUSCULAR | Status: DC | PRN

## 2012-06-09 MED ORDER — LISINOPRIL-HYDROCHLOROTHIAZIDE 20-12.5 MG PO TABS: 1.0000 | ORAL_TABLET | Freq: Every day | ORAL | Status: DC

## 2012-06-09 MED ORDER — DEXTROSE 5 % IV SOLN
1.0000 g | Freq: Once | INTRAVENOUS | Status: AC
  Administered 2012-06-09: 1 g via INTRAVENOUS
  Filled 2012-06-09: qty 10

## 2012-06-09 MED ORDER — ACETAMINOPHEN 325 MG PO TABS
650.0000 mg | ORAL_TABLET | Freq: Once | ORAL | Status: AC
  Administered 2012-06-09: 650 mg via ORAL
  Filled 2012-06-09: qty 2

## 2012-06-09 MED ORDER — ALUM & MAG HYDROXIDE-SIMETH 200-200-20 MG/5ML PO SUSP: 30.0000 mL | Freq: Four times a day (QID) | ORAL | Status: DC | PRN

## 2012-06-09 MED ORDER — ACETAMINOPHEN 650 MG RE SUPP: 650.0000 mg | Freq: Four times a day (QID) | RECTAL | Status: DC | PRN

## 2012-06-09 MED ORDER — IOHEXOL 300 MG/ML  SOLN: 25.0000 mL | INTRAMUSCULAR | Status: AC

## 2012-06-09 NOTE — ED Notes (Signed)
Pt sleeping on stretcher. Comfortable. No complaints at this time. Awaiting MD orders.

## 2012-06-09 NOTE — ED Notes (Signed)
Pt arrives via EMS in wheelchair. States diagnosed with UTI by PCP 2 weeks ago. States has been taking nitrofurantoin with no improvement in sx. States pain is increasing and is moving up her back. Also continues with low grade fever at home to 101.

## 2012-06-09 NOTE — ED Provider Notes (Signed)
Medical screening examination/treatment/procedure(s) were conducted as a shared visit with non-physician practitioner(s) and myself.  I personally evaluated the patient during the encounter   Nelia Shi, MD 06/09/12 1442

## 2012-06-09 NOTE — ED Provider Notes (Signed)
History     CSN: [id number]  Arrival date & time 06/09/12  1020   First MD Initiated Contact with Patient 06/09/12 1146      Chief Complaint  Patient presents with  . Dysuria    (Consider location/radiation/quality/duration/timing/severity/associated sxs/prior treatment) HPI Comments: [name] is a 78 y.o. Female  with a history of diabetes and hypertension presents that emergency department with chief complaint of dysuria.  Onset of symptoms began approximately one month ago when patient was diagnosed with a urinary tract infection.  At that time she was treated with an antibiotic that she is unaware of abx name, however symptoms did not resolve.  Patient followed up last week with her primary care physician and was given a different abx prescription for Macrobid of which patient has started 2 days ago. She presents today to the emergency department for continued low-grade fever (highest at 101), weakness,new onset  back/flank pain and suprapubic abdominal pain.  Patient denies any nausea or emesis, hematuria, change  mental state, weakness, syncope, chest pain, SOB, change in vision, or difficulty ambulating. Pt currently lives with her daughter who is in room and confirms story. She adds that when febrile last evening patient the patient was speaking "bizarre" for example "I fell off a horse & my hands fell off."   Patient is a 78 y.o. female presenting with dysuria. The history is provided by the patient and medical records.  Dysuria  Associated symptoms include frequency and flank pain. Pertinent negatives include no chills, no nausea, no vomiting, no hematuria and no urgency.    Past Medical History  Diagnosis Date  . Diabetes mellitus without complication   . Hypertension   . UTI (lower urinary tract infection)     No past surgical history on file.  No family history on file.  History  Substance Use Topics  . Smoking status: Not on file  . Smokeless tobacco: Not on file   . Alcohol Use: Not on file    OB History   Grav Para Term Preterm Abortions TAB SAB Ect Mult Living                  Review of Systems  Constitutional: Negative for fever, chills and appetite change.  HENT: Negative for congestion.   Eyes: Negative for visual disturbance.  Respiratory: Negative for shortness of breath.   Cardiovascular: Negative for chest pain and leg swelling.  Gastrointestinal: Positive for abdominal pain. Negative for nausea and vomiting.  Genitourinary: Positive for dysuria, frequency, flank pain and difficulty urinating. Negative for urgency, hematuria, vaginal bleeding and enuresis.  Neurological: Negative for dizziness, syncope, weakness, light-headedness, numbness and headaches.  Psychiatric/Behavioral: Negative for confusion.  All other systems reviewed and are negative.    Allergies  Aspirin  Home Medications   Current Outpatient Rx  Name  Route  Sig  Dispense  Refill  . lisinopril-hydrochlorothiazide (PRINZIDE,ZESTORETIC) 20-12.5 MG per tablet   Oral   Take 1 tablet by mouth daily.         . nitrofurantoin, macrocrystal-monohydrate, (MACROBID) 100 MG capsule   Oral   Take 100 mg by mouth 2 (two) times daily. For bladder infection. Unknown d/c date.           BP 128/48  Pulse 92  Temp(Src) 100.7 F (38.2 C)  Resp 34  SpO2 92%  Physical Exam  Nursing note and vitals reviewed. Constitutional: She is oriented to person, place, and time. She appears well-developed and well-nourished. No distress.  HENT:  Head: Normocephalic and atraumatic.  Eyes: Conjunctivae and EOM are normal.  Neck: Normal range of motion.  Cardiovascular:  Regular rate rhythm, intact distal pulses  Pulmonary/Chest: Effort normal.  Lungs clear to auscultation bilaterally, patient coughing throughout exam  Abdominal:  Soft abdomen with normal bowel sounds and suprapubic tenderness to palpation  Genitourinary:  Bilateral flank tenderness  Musculoskeletal:  Normal range of motion.  Neurological: She is alert and oriented to person, place, and time.  Skin: Skin is warm and dry. No rash noted. She is not diaphoretic.  Psychiatric: She has a normal mood and affect. Her behavior is normal.    ED Course  Procedures (including critical care time)  Labs Reviewed  URINALYSIS, ROUTINE W REFLEX MICROSCOPIC - Abnormal; Notable for the following:    Color, Urine ORANGE (*)    APPearance TURBID (*)    Glucose, UA 100 (*)    Hgb urine dipstick MODERATE (*)    Bilirubin Urine SMALL (*)    Ketones, ur >80 (*)    Protein, ur 100 (*)    Nitrite POSITIVE (*)    Leukocytes, UA MODERATE (*)    All other components within normal limits  CBC WITH DIFFERENTIAL - Abnormal; Notable for the following:    Neutrophils Relative 81 (*)    Neutro Abs 8.3 (*)    Lymphocytes Relative 7 (*)    Monocytes Absolute 1.3 (*)    All other components within normal limits  COMPREHENSIVE METABOLIC PANEL - Abnormal; Notable for the following:    Sodium 134 (*)    Glucose, Bld 158 (*)    Total Bilirubin 1.4 (*)    GFR calc non Af Amer 65 (*)    GFR calc Af Amer 75 (*)    All other components within normal limits  URINE MICROSCOPIC-ADD ON - Abnormal; Notable for the following:    Squamous Epithelial / LPF MANY (*)    Bacteria, UA MANY (*)    All other components within normal limits  URINE CULTURE   No results found.   No diagnosis found.    MDM  Pyonephritis   This is a 63 old female who presents emergency department status post failed outpatient treatment for urinary tract infection on 2 different antibiotics.  Symptom onset began approximately one month ago and has been gradually worsening ever since.  Recent onset of flank pain began 2 days ago.  Patient denies any nausea or vomiting but did report generalized weakness.  Labs reviewed showing significant urinary tract infection, dehydration and normal kidney function.  Patient treated in the emergency department  with IV fluids and IV Rocephin.  She is to be admitted for continued IV antibiotics and observation overnight. The patient appears reasonably stabilized for admission considering the current resources, flow, and capabilities available in the ED at this time, and I doubt any other [facility] requiring further screening and/or treatment in the ED prior to admission.     PCP- [facility] 6, Rai, med surg         [place], [place] 06/09/12 [phone]

## 2012-06-09 NOTE — H&P (Signed)
History and Physical       Hospital Admission Note Date: 06/09/2012  Patient name: [name] Medical record number: [id number] Date of birth: 1934-07-26 Age: 78 y.o. Gender: female PCP: Renaye Rakers, M.D.    Chief Complaint:  Fevers with back pain  HPI: Patient is a 78 year old female with history of diabetes, hypertension, presented to the ED with fevers and back pain, suprapubic pain and dysuria. Patient states that her symptoms started a month ago and she was diagnosed with UTI. Patient was treated with an antibiotic prescribed by her PCP however she does not remember the name of an antibiotic and her symptoms did not resolve. Patient followed up again last week with her PCP and was started on Macrobid, patient started taking Macrobid 2 days ago. Last night she started having fevers 101F, bilateral back and flank pain, suprapubic abdominal pain. Patient lives with her daughter were also reported that when she was having a fever, she noticed her mother to be confused.     Review of Systems:  Constitutional: + fever, chills, appetite change and fatigue.  HEENT: Denies photophobia, eye pain, redness, hearing loss, ear pain, congestion, sore throat, rhinorrhea, sneezing, mouth sores, trouble swallowing, neck pain, neck stiffness and tinnitus.   Respiratory: Denies SOB, DOE, cough, chest tightness,  and wheezing.   Cardiovascular: Denies chest pain, palpitations and leg swelling.  Gastrointestinal: Denies nausea, vomiting,diarrhea, constipation, blood in stool and abdominal distention.  Genitourinary: Please see history of present illness  Musculoskeletal: Denies myalgias, back pain, joint swelling, arthralgias and gait problem.  Skin: Denies pallor, rash and wound.  Neurological: Denies dizziness, seizures, syncope, light-headedness, numbness and headaches, +generalized weakness.  Hematological: Denies adenopathy. Easy bruising,  personal or family bleeding history  Psychiatric/Behavioral: Denies suicidal ideation, mood changes, confusion, nervousness, sleep disturbance and agitation  Past Medical History: Past Medical History  Diagnosis Date  . Diabetes mellitus without complication   . Hypertension   . UTI (lower urinary tract infection)    Past surgical history Knee replacement 3 years ago  Medications: Prior to Admission medications   Medication Sig Start Date End Date Taking? Authorizing Provider  lisinopril-hydrochlorothiazide (PRINZIDE,ZESTORETIC) 20-12.5 MG per tablet Take 1 tablet by mouth daily.   Yes Historical Provider, MD  nitrofurantoin, macrocrystal-monohydrate, (MACROBID) 100 MG capsule Take 100 mg by mouth 2 (two) times daily. For bladder infection. Unknown d/c date.   Yes Historical Provider, MD    Allergies:   Allergies  Allergen Reactions  . Aspirin     Social History:  Patient reports not using any tobacco, alcohol, or drugs. She currently lives at home with her daughter and is functional with her ADLs    Family History: No family history on file.  Physical Exam: Blood pressure 133/73, pulse 90, temperature 100.7 F (38.2 C), resp. rate 20, SpO2 90.00%. General: Alert, awake, oriented x3, in no acute distress. HEENT: normocephalic, atraumatic, anicteric sclera, pink conjunctiva, pupils equal and reactive to light and accomodation, oropharynx clear Neck: supple, no masses or lymphadenopathy, no goiter, no bruits  Heart: Regular rate and rhythm, without murmurs, rubs or gallops. Lungs: Clear to auscultation bilaterally, no wheezing, rales or rhonchi. Abdomen: Soft, nondistended, positive bowel sounds, suprapubic tenderness, bilateral flank tenderness Extremities: No clubbing, cyanosis or edema with positive pedal pulses. Neuro: Grossly intact, no focal neurological deficits, strength 5/5 upper and lower extremities bilaterally Psych: alert and oriented x 3, normal mood and  affect Skin: no rashes or lesions, warm and dry   LABS on  Admission:  Basic Metabolic Panel:  Recent Labs Lab 06/09/12 1229  NA 134*  K 3.7  CL 96  CO2 29  GLUCOSE 158*  BUN 19  CREATININE 0.85  CALCIUM 9.0   Liver Function Tests:  Recent Labs Lab 06/09/12 1229  AST 26  ALT 29  ALKPHOS 79  BILITOT 1.4*  PROT 7.6  ALBUMIN 3.5   CBC:  Recent Labs Lab 06/09/12 1229  WBC 10.3  NEUTROABS 8.3*  HGB 12.5  HCT 36.1  MCV 90.0  PLT 152    Radiological Exams on Admission: No results found.  Assessment/Plan Principal Problem:   Pyelonephritis, acute likely secondary to recurrent UTI: Failure to outpatient oral antibiotic therapy - Admit to MedSurg, given fevers, I will obtain blood cultures with urine culture - Start on IV Rocephin, adjust antibiotic according to culture results -Obtain CT abdomen and pelvis to rule out any necrotizing pyelonephritis or renal abscess  Active Problems:    Diabetes mellitus - Obtain HbA1c, place on sliding scale insulin    HTN (hypertension): - Continue lisinopril and HCTZ    Dehydration with ketones on UA - Continue IV hydration  DVT prophylaxis: Lovenox   CODE STATUS: Full code   Further plan will depend as patient's clinical course evolves and further radiologic and laboratory data become available.   Time Spent on Admission: 45 mins  Amari Burnsworth M.D. [facility] 06/09/2012, 3:02 PM Pager: [phone]  If 7PM-7AM, please contact night-coverage [email or web address] Password [email or web address]

## 2012-06-09 NOTE — ED Notes (Signed)
Dx w/uti  a month ago  and went back to dr and they changed her meds but its not helping she is having back pain and lower abd pain  X 2 weeks

## 2012-06-09 NOTE — ED Notes (Signed)
Care transferred, report received Kristin Bruins, RN.

## 2012-06-10 ENCOUNTER — Encounter (HOSPITAL_COMMUNITY): Payer: Self-pay | Admitting: Surgery

## 2012-06-10 ENCOUNTER — Observation Stay (HOSPITAL_COMMUNITY): Payer: Medicare HMO

## 2012-06-10 LAB — GLUCOSE, CAPILLARY
Glucose-Capillary: 163 mg/dL — ABNORMAL HIGH (ref 70–99)
Glucose-Capillary: 148 mg/dL — ABNORMAL HIGH (ref 70–99)

## 2012-06-10 LAB — BASIC METABOLIC PANEL
CO2: 26 mEq/L (ref 19–32)
Chloride: 100 mEq/L (ref 96–112)
GFR calc Af Amer: 70 mL/min — ABNORMAL LOW (ref >90)
Potassium: 3.8 mEq/L (ref 3.5–5.1)
Sodium: 137 mEq/L (ref 135–145)

## 2012-06-10 LAB — CBC
HCT: 32.1 % — ABNORMAL LOW (ref 36.0–46.0)
MCV: 89.4 fL (ref 78.0–100.0)
Platelets: 144 10*3/uL — ABNORMAL LOW (ref 150–400)
RBC: 3.59 MIL/uL — ABNORMAL LOW (ref 3.87–5.11)
RDW: 12.3 % (ref 11.5–15.5)
WBC: 8.1 10*3/uL (ref 4.0–10.5)

## 2012-06-10 MED ORDER — VANCOMYCIN HCL 10 G IV SOLR
1500.0000 mg | INTRAVENOUS | Status: DC
  Administered 2012-06-10: 1500 mg via INTRAVENOUS
  Filled 2012-06-10 (×2): qty 1500

## 2012-06-10 MED ORDER — IOHEXOL 300 MG/ML  SOLN
100.0000 mL | Freq: Once | INTRAMUSCULAR | Status: AC | PRN
  Administered 2012-06-10: 100 mL via INTRAVENOUS

## 2012-06-10 NOTE — Progress Notes (Signed)
ANTIBIOTIC CONSULT NOTE - INITIAL  Pharmacy Consult for Vancomycin Indication: Pyelonephritis  Allergies  Allergen Reactions  . Aspirin     Patient Measurements: Height: 5\' 3"  (160 cm) Weight: 177 lb 3.2 oz (80.377 kg) IBW/kg (Calculated) : 52.4 Adjusted Body Weight:    Vital Signs: Temp: 101.3 F (38.5 C) (03/14 0954) Temp src: Oral (03/14 0954) BP: 120/48 mmHg (03/14 0958) Pulse Rate: 74 (03/14 0954) Intake/Output from previous day: 03/13 0701 - 03/14 0700 In: 1105 [I.V.:1055; IV Piggyback:50] Out: -  Intake/Output from this shift:    Labs:  Recent Labs  06/09/12 1229 06/09/12 1652 06/10/12 0550  WBC 10.3 10.4 8.1  HGB 12.5 11.7* 11.3*  PLT 152 155 144*  CREATININE 0.85 0.82 0.90   Estimated Creatinine Clearance: 53.4 ml/min (by C-G formula based on Cr of 0.9). No results found for this basename: Rolm Gala, VANCORANDOM, GENTTROUGH, GENTPEAK, GENTRANDOM, TOBRATROUGH, TOBRAPEAK, TOBRARND, AMIKACINPEAK, AMIKACINTROU, AMIKACIN,  in the last 72 hours   Microbiology: Recent Results (from the past 720 hour(s))  URINE CULTURE     Status: None   Collection Time    06/09/12 12:21 PM      Result Value Range Status   Specimen Description URINE, CLEAN CATCH   Final   Special Requests NONE   Final   Culture  Setup Time 06/09/2012 13:20   Final   Colony Count PENDING   Incomplete   Culture Culture reincubated for better growth   Final   Report Status PENDING   Incomplete    Medical History: Past Medical History  Diagnosis Date  . Diabetes mellitus without complication   . Hypertension   . UTI (lower urinary tract infection)     Medications:  Prescriptions prior to admission  Medication Sig Dispense Refill  . lisinopril-hydrochlorothiazide (PRINZIDE,ZESTORETIC) 20-12.5 MG per tablet Take 1 tablet by mouth daily.      . nitrofurantoin, macrocrystal-monohydrate, (MACROBID) 100 MG capsule Take 100 mg by mouth 2 (two) times daily. For bladder  infection. Unknown d/c date.       Assessment: Fevers, back pain, suprapubic pain, dysuria. Last UTI dx 1 month ago without resolution of symptoms (failed 2 courses of outpatient abx).  Tmax 101.3. WBC 8.1. Scr 0.9 with estimated CrCl 53  Goal of Therapy:  Vancomycin trough level 10-15 mcg/ml  Plan:  Vancomycin 1500mg  IV q24h. Trough after 3-5 doses Rocephin 1g IV q24h.   Crystal S. Merilynn Finland, PharmD, BCPS Clinical Staff Pharmacist Pager [phone]  Misty Stanley Stillinger 06/10/2012,12:24 PM

## 2012-06-10 NOTE — Evaluation (Signed)
Physical Therapy Evaluation Patient Details Name: [name] MRN: [id number] DOB: 06/15/1934 Today's Date: 06/10/2012 Time: 2440-1027 PT Time Calculation (min): 22 min  PT Assessment / Plan / Recommendation Clinical Impression  Pt is a 78 y.o. female who presents with UTi and kidney infection.  Patient reports pain in flank and back.  Patient limited in functional mobility secondary to incoordination, poor muscular control.     PT Assessment  Patient needs continued PT services    Follow Up Recommendations  Other (comment) (may need SNF placement pending progress)          Equipment Recommendations  None recommended by PT    Recommendations for Other Services OT consult   Frequency Min 3X/week    Precautions / Restrictions Precautions Precautions: Fall Restrictions Weight Bearing Restrictions: No   Pertinent Vitals/Pain 10/10      Mobility  Bed Mobility Bed Mobility: Rolling Right;Rolling Left;Right Sidelying to Sit;Sitting - Scoot to [place] of Bed;Sit to Supine Rolling Right: 4: Min assist Rolling Left: 4: Min assist Right Sidelying to Sit: 3: Mod assist Sitting - Scoot to Edge of Bed: 2: Max assist Sit to Supine: 3: Mod assist Details for Bed Mobility Assistance: Patient with severe difficulty with bed mobility, assist to pull to sit; assist for body positioninig despite multimodal cues patient unable to carry out task on her own without assist Transfers Transfers: Sit to Stand;Stand to Sit Sit to Stand: 3: Mod assist Stand to Sit: 3: Mod assist Details for Transfer Assistance: Patient could not maintain standing position as she kept leaning backwards Ambulation/Gait Ambulation/Gait Assistance: Not tested (comment)    Exercises     PT Diagnosis: Acute pain  PT Problem List: Decreased strength;Decreased range of motion;Decreased activity tolerance;Decreased balance;Decreased mobility;Decreased coordination;Decreased cognition;Pain PT Treatment Interventions:  DME instruction;Gait training;Functional mobility training;Therapeutic activities;Therapeutic exercise;Patient/family education   PT Goals Acute Rehab PT Goals PT Goal Formulation: With patient/family Time For Goal Achievement: 06/17/12 Potential to Achieve Goals: Good Pt will go Sit to Stand: Independently PT Goal: Sit to Stand - Progress: Goal set today Pt will go Stand to Sit: Independently PT Goal: Stand to Sit - Progress: Goal set today Pt will Stand: Independently PT Goal: Stand - Progress: Goal set today Pt will Ambulate: >150 feet;Independently PT Goal: Ambulate - Progress: Goal set today  Visit Information  Last PT Received On: 06/10/12 Assistance Needed: +2    Subjective Data  Subjective: "I am in a lot of pain 10 10 10" Patient Stated Goal: feel better   Prior Functioning  Home Living Lives With: Family;Son;Daughter Available Help at Discharge: Family;Available 24 hours/day Type of Home: House Home Access: Ramped entrance Home Layout: One level Bathroom Shower/Tub: Engineer, manufacturing systems: Standard Home Adaptive Equipment: Straight cane;Walker - rolling Prior Function Level of Independence: Independent Driving: No Communication Communication: No difficulties Dominant Hand: Right    Cognition  Cognition Overall Cognitive Status: Impaired Area of Impairment: Awareness of deficits;Problem solving Arousal/Alertness: Awake/alert Orientation Level: Oriented X4 / Intact Behavior During Session: Anxious Awareness of Deficits: Patient unable to correct positioning or realize that she was not placing feet on floor  Problem Solving: Pt aware she had been incontinent but did not ring for help Cognition - Other Comments: Daughter states this is severely off from baseline    Extremity/Trunk Assessment Right Upper Extremity Assessment RUE Coordination: Deficits RUE Coordination Deficits: tremors noted worse with intention (not baseline Left Upper Extremity  Assessment LUE ROM/Strength/Tone: Deficits LUE ROM/Strength/Tone Deficits: patient unable to extend  3-5th digits secondary to prior injury Right Lower Extremity Assessment RLE ROM/Strength/Tone: [facility] for tasks assessed Left Lower Extremity Assessment LLE ROM/Strength/Tone: [facility] for tasks assessed   Balance Balance Balance Assessed: Yes Static Sitting Balance Static Sitting - Balance Support: Feet supported Static Sitting - Level of Assistance: 3: Mod assist Static Sitting - Comment/# of Minutes: 5 minutes; patient unable to maintain balance without assist; continuously leaning backwards and to the side opposite of support.  Static Standing Balance Static Standing - Balance Support: Bilateral upper extremity supported Static Standing - Level of Assistance: 3: Mod assist Static Standing - Comment/# of Minutes: Patient unable to maintain standing; constantly falling posteriorly; unsafe for ambulation at this time.  End of Session PT - End of Session Equipment Utilized During Treatment: Gait belt Activity Tolerance: Patient limited by fatigue Patient left: in bed;with call bell/phone within reach;with bed alarm set;with family/visitor present Nurse Communication: Mobility status  GP Functional Assessment Tool Used: Clinical Judgement Functional Limitation: Mobility: Walking and moving around Mobility: Walking and Moving Around Current Status (W0981): At least 40 percent but less than 60 percent impaired, limited or restricted Mobility: Walking and Moving Around Goal Status [id number]): At least 1 percent but less than 20 percent impaired, limited or restricted   Fabio Asa 06/10/2012, 1:26 PM Charlotte Crumb, PT DPT  [phone]

## 2012-06-10 NOTE — Progress Notes (Signed)
UR completed 

## 2012-06-10 NOTE — Progress Notes (Signed)
Occupational Therapy Evaluation Patient Details Name: [name] MRN: [id number] DOB: [birth year] Today's Date: 06/10/2012 Time: 4098-1191 OT Time Calculation (min): 27 min  OT Assessment / Plan / Recommendation Clinical Impression  78 yo admitted with UTI. PTA, pt lived independently with daughter. Pt has had functional decline but refuses to go to SNF, which would be safest option. Given pt's refusal of SNF, recommend [facility] services to follow up with pt. Daughter states she can provide level of care needed at d/C.    OT Assessment  Patient needs continued OT Services    Follow Up Recommendations  Home health OT    Barriers to Discharge None    Equipment Recommendations  None recommended by OT    Recommendations for Other Services    Frequency  Min 2X/week    Precautions / Restrictions Precautions Precautions: Fall   Pertinent Vitals/Pain no apparent distress     ADL  Upper Body Bathing: Set up;Supervision/safety Lower Body Bathing: Moderate assistance Where Assessed - Lower Body Bathing: Supported sit to stand Upper Body Dressing: Set up;Supervision/safety Where Assessed - Upper Body Dressing: Supported sitting Lower Body Dressing: Moderate assistance Where Assessed - Lower Body Dressing: Supported sit to Pharmacist, hospital: Moderate assistance Toilet Transfer Method: Stand pivot Toileting - Clothing Manipulation and Hygiene: Moderate assistance Where Assessed - Toileting Clothing Manipulation and Hygiene: Sit to stand from 3-in-1 or toilet Transfers/Ambulation Related to ADLs: Mod A. limited mobility ADL Comments: limited by pain and fatigue    OT Diagnosis: Generalized weakness;Acute pain  OT Problem List: Decreased strength;Decreased activity tolerance;Impaired balance (sitting and/or standing);Decreased safety awareness;Decreased knowledge of use of DME or AE;Decreased knowledge of precautions;Obesity;Impaired UE functional use;Pain OT Treatment Interventions:  Self-care/ADL training;Energy conservation;DME and/or AE instruction;Therapeutic activities;Patient/family education;Balance training   OT Goals Acute Rehab OT Goals OT Goal Formulation: With patient Time For Goal Achievement: 06/24/12 Potential to Achieve Goals: Good ADL Goals Pt Will Transfer to Toilet: with min assist;with caregiver independent in assisting;with DME;Ambulation;3-in-1 ADL Goal: Toilet Transfer - Progress: Goal set today Pt Will Perform Toileting - Clothing Manipulation: with modified independence;Standing ADL Goal: Toileting - Clothing Manipulation - Progress: Goal set today Pt Will Perform Toileting - Hygiene: Sit to stand from 3-in-1/toilet;with supervision;with caregiver independent in assisting ADL Goal: Toileting - Hygiene - Progress: Goal set today Additional ADL Goal #1: Caregiver will demonstrate ability to complete ADL with pt saety at sit - stand level.` ADL Goal: Additional Goal #1 - Progress: Goal set today  Visit Information  Last OT Received On: 06/10/12 Assistance Needed: +1    Subjective Data      Prior Functioning     Home Living Lives With: Family;Son;Daughter Available Help at Discharge: Family;Available 24 hours/day Type of Home: House Home Access: Ramped entrance Home Layout: One level Bathroom Shower/Tub: Engineer, manufacturing systems: Standard Bathroom Accessibility: Yes How Accessible: Accessible via walker Home Adaptive Equipment: Straight cane;Walker - rolling;Bedside commode/3-in-1 Prior Function Level of Independence: Independent Driving: No Communication Communication: No difficulties Dominant Hand: Right         Vision/Perception     Cognition  Cognition Overall Cognitive Status: Impaired Area of Impairment: Awareness of deficits;Problem solving Arousal/Alertness: Awake/alert Orientation Level: Oriented X4 / Intact Behavior During Session: WFL for tasks performed Awareness of Deficits: realizes  weakness Problem Solving: min A functional basic Cognition - Other Comments: daughter states that mother is beginning to clear    Extremity/Trunk Assessment Right Upper Extremity Assessment RUE ROM/Strength/Tone: [facility] for tasks assessed RUE Coordination Deficits: intntetion  tremors Left Upper Extremity Assessment LUE ROM/Strength/Tone: Deficits LUE ROM/Strength/Tone Deficits: patient unable to extend 3-5th digits secondary to prior injury Trunk Assessment Trunk Assessment: Kyphotic     Mobility Bed Mobility Bed Mobility: Left Sidelying to Sit;Sitting - Scoot to Edge of Bed Left Sidelying to Sit: 3: Mod assist Sitting - Scoot to Edge of Bed: 4: Min assist Details for Bed Mobility Assistance: using rails. does not sleep i bed at home Transfers Transfers: Sit to Stand;Stand to Sit Sit to Stand: 3: Mod assist;With upper extremity assist;From bed Stand to Sit: 3: Mod assist;With upper extremity assist;To chair/3-in-1     Exercise     Balance     End of Session OT - End of Session Equipment Utilized During Treatment: Gait belt Activity Tolerance: Patient limited by fatigue Patient left: in chair;with call bell/phone within reach;with family/visitor present Nurse Communication: Mobility status  GO Functional Assessment Tool Used: clinical judgement Functional Limitation: Self care Self Care Current Status (W0981): At least 20 percent but less than 40 percent impaired, limited or restricted Self Care Goal Status (X9147): At least 20 percent but less than 40 percent impaired, limited or restricted   WARD,HILLARY 06/10/2012, 4:14 PM [facility], OTR/L  [phone] 06/10/2012

## 2012-06-10 NOTE — Progress Notes (Signed)
Patient ID: [name]  female  ZOX:[id number]    DOB: 1935/03/23    DOA: 06/09/2012  PCP: Geraldo Pitter, MD  Assessment/Plan:  Principal Problem:  Right-sided Pyelonephritis, acute likely secondary to recurrent UTI: Failure to outpatient oral antibiotic therapy: Still spiking fevers  - Blood cultures negative so far, urine culture still pending - on IV Rocephin, I have also added vancomycin to cover for any gram-positive bugs or staph  -CT abdomen and pelvis confirmed the right-sided pyelonephritis   Active Problems:  Generalized weakness: - PT eval was done prior to my encounter, per physical therapist, patient was uncoordinated, very weak, she was somewhat tremulous at the time of my examination, hence MRI of the brain was obtained to rule out any cerebellar stroke and is negative - Per patient's daughter, ever since patient started having fevers and recurrent UTIs, she developed this weakness and tremors in the last 1 month, prior to this she was functional with her ADLs - I discontinued HCTZ  Diabetes mellitus  - Continue sliding scale   HTN (hypertension):  - Continue lisinopril   Dehydration with ketones on UA  - Continue IV hydration today  DVT prophylaxis: Lovenox   CODE STATUS: Full code   Disposition: Not medically ready  Subjective: Still spiking fevers, feels very weak, still complaining of suprapubic abdominal pain with back pain  Objective: Weight change:   Intake/Output Summary (Last 24 hours) at 06/10/12 1417 Last data filed at 06/10/12 1256  Gross per 24 hour  Intake   1409 ml  Output      0 ml  Net   1409 ml   Blood pressure 120/48, pulse 74, temperature 101.3 F (38.5 C), temperature source Oral, resp. rate 16, height 5\' 3"  (1.6 m), weight 80.377 kg (177 lb 3.2 oz), SpO2 95.00%.  Physical Exam: General: Alert and awake, oriented x3, not in any acute distress. CVS: S1-S2 clear, no murmur rubs or gallops Chest: clear to auscultation bilaterally, no  wheezing, rales or rhonchi Abdomen: soft mild suprapubic pain, CVAT rt Extremities: no c/c/e, tremors Neuro: Cranial nerves II-XII intact, no focal neurological deficits  Lab Results: Basic Metabolic Panel:  Recent Labs Lab 06/09/12 1229 06/09/12 1652 06/10/12 0550  NA 134*  --  137  K 3.7  --  3.8  CL 96  --  100  CO2 29  --  26  GLUCOSE 158*  --  127*  BUN 19  --  16  CREATININE 0.85 0.82 0.90  CALCIUM 9.0  --  8.4   Liver Function Tests:  Recent Labs Lab 06/09/12 1229  AST 26  ALT 29  ALKPHOS 79  BILITOT 1.4*  PROT 7.6  ALBUMIN 3.5   CBC:  Recent Labs Lab 06/09/12 1229 06/09/12 1652 06/10/12 0550  WBC 10.3 10.4 8.1  NEUTROABS 8.3*  --   --   HGB 12.5 11.7* 11.3*  HCT 36.1 33.7* 32.1*  MCV 90.0 91.3 89.4  PLT 152 155 144*   Cardiac Enzymes: No results found for this basename: CKTOTAL, CKMB, CKMBINDEX, TROPONINI,  in the last 168 hours BNP: No components found with this basename: POCBNP,  CBG:  Recent Labs Lab 06/09/12 1756 06/09/12 2148 06/09/12 2221 06/10/12 0823 06/10/12 1223  GLUCAP 135* 141* 125* 111* 163*     Micro Results: Recent Results (from the past 240 hour(s))  URINE CULTURE     Status: None   Collection Time    06/09/12 12:21 PM      Result Value  Range Status   Specimen Description URINE, CLEAN CATCH   Final   Special Requests NONE   Final   Culture  Setup Time 06/09/2012 13:20   Final   Colony Count PENDING   Incomplete   Culture Culture reincubated for better growth   Final   Report Status PENDING   Incomplete    Studies/Results: Mr Brain Wo Contrast  06/10/2012  *RADIOLOGY REPORT*  Clinical Data: Decreased strength in the range of motion. Decreased balance.  Diabetes.  MRI HEAD WITHOUT CONTRAST  Technique:  Multiplanar, multiecho pulse sequences of the brain and surrounding structures were obtained according to standard protocol without intravenous contrast.  Comparison: CT head without contrast 06/08/2008.  Findings:  No acute infarct, hemorrhage, or mass lesion is present. Moderate periventricular and scattered subcortical T2 and FLAIR hyperintensities are greater than expected for age.  The ventricles are of normal size.  No significant extra-axial fluid collection is present.  Flow is present in the major intracranial arteries.  The patient is status post bilateral lens extractions.  Mild mucosal thickening is present in the maxillary sinuses bilaterally.  The paranasal sinuses and mastoid air cells are clear.  IMPRESSION:  1.  Moderate periventricular subcortical white matter disease is greater than expected for age. The finding is nonspecific but can be seen in the setting of chronic microvascular ischemia, a demyelinating process such as multiple sclerosis, vasculitis, complicated migraine headaches, or as the sequelae of a prior infectious or inflammatory process. 2.  No acute intracranial abnormality.   Original Report Authenticated By: Marin Roberts, M.D.    Ct Abdomen Pelvis W Contrast  06/10/2012  *RADIOLOGY REPORT*  Clinical Data: Lower abdominal pain  CT ABDOMEN AND PELVIS WITH CONTRAST  Technique:  Multidetector CT imaging of the abdomen and pelvis was performed following the standard protocol during bolus administration of intravenous contrast.  Contrast: OMNIPAQUE IOHEXOL 300 MG/ML  SOLN  Comparison:  None  Findings: Bibasilar opacities.  Normal heart size.  Aortic valve and coronary artery calcifications.  Hepatic steatosis.  Unremarkable spleen, pancreas, adrenal glands. Absent gallbladder.  No biliary ductal dilatation.  Unremarkable left kidney.  Right renal edema, heterogeneous enhancement, and perinephric fat stranding.  Right periureteral fat stranding.  No hydronephrosis or hydroureter.  Colonic diverticulosis.  No CT evidence for colitis or diverticulitis.  Appendix within normal limits.  Small bowel loops are normal course and caliber.  Para esophageal calcifications presumably reflect  sequelae of prior granulomatous infection.  Duodenal diverticulum.  No free intraperitoneal air or fluid.  No lymphadenopathy.  There is scattered atherosclerotic calcification of the aorta and its branches. No aneurysmal dilatation.  Mild perivesicular fat stranding and wall thickening.  Absent uterus.  No adnexal mass.  Multilevel degenerative changes of the imaged spine. No acute or aggressive appearing osseous lesion. T10 hemangioma.  IMPRESSION: CT findings are most in keeping with the clinical diagnosis of acute pyelonephritis.  No abscess.  Hepatic steatosis.  Bibasilar opacities; atelectasis versus infiltrate.  Colonic diverticulosis.   Original Report Authenticated By: Jearld Lesch, M.D.     Medications: Scheduled Meds: . cefTRIAXone (ROCEPHIN)  IV  1 g Intravenous Q24H  . enoxaparin (LOVENOX) injection  40 mg Subcutaneous Q24H  . insulin aspart  0-5 Units Subcutaneous QHS  . insulin aspart  0-9 Units Subcutaneous TID WC  . lisinopril  20 mg Oral Daily  . vancomycin  1,500 mg Intravenous Q24H      LOS: 1 day   RAI,RIPUDEEP M.D. [facility]  06/10/2012, 2:17 PM Pager: [phone]  If 7PM-7AM, please contact night-coverage [email or web address] Password [email or web address]

## 2012-06-11 LAB — CBC
HCT: 32 % — ABNORMAL LOW (ref 36.0–46.0)
Hemoglobin: 11 g/dL — ABNORMAL LOW (ref 12.0–15.0)
MCH: 30.9 pg (ref 26.0–34.0)
MCHC: 34.4 g/dL (ref 30.0–36.0)
RDW: 12.2 % (ref 11.5–15.5)

## 2012-06-11 LAB — URINE CULTURE: Colony Count: >=100000

## 2012-06-11 LAB — GLUCOSE, CAPILLARY: Glucose-Capillary: 134 mg/dL — ABNORMAL HIGH (ref 70–99)

## 2012-06-11 LAB — BASIC METABOLIC PANEL
BUN: 9 mg/dL (ref 6–23)
Chloride: 101 mEq/L (ref 96–112)
Creatinine, Ser: 0.74 mg/dL (ref 0.50–1.10)
GFR calc Af Amer: >90 mL/min (ref >90)
Glucose, Bld: 137 mg/dL — ABNORMAL HIGH (ref 70–99)
Potassium: 3.4 mEq/L — ABNORMAL LOW (ref 3.5–5.1)

## 2012-06-11 MED ORDER — LEVOFLOXACIN 750 MG PO TABS
750.0000 mg | ORAL_TABLET | Freq: Every day | ORAL | Status: DC
  Administered 2012-06-11 – 2012-06-12 (×2): 750 mg via ORAL
  Filled 2012-06-11 (×2): qty 1

## 2012-06-11 NOTE — Progress Notes (Signed)
UR completed 

## 2012-06-11 NOTE — Progress Notes (Signed)
Patient ID: [name]  female  ZOX:[id number]    DOB: [birth year]    DOA: 06/09/2012  PCP: Geraldo Pitter, MD  Assessment/Plan:  Principal Problem:  Right-sided Pyelonephritis, acute likely secondary to recurrent UTI: Failure to outpatient oral antibiotic therapy: Afebrile since starting vancomycin - Blood cultures negative so far, repeat urine culture did not show any growth however patient was already on antibiotics - on IV Rocephin and IV vancomycin. Placed on oral levofloxacin and monitor overnight. If medically stable and does not spike any fevers or no worsening leukocytosis, DC in a.m. with 2 weeks of oral levofloxacin to treat pyelonephritis. -CT abdomen and pelvis confirmed the right-sided pyelonephritis   Active Problems:  Generalized weakness: - MRI of the brain was negative for any stroke. HCTZ discontinued - Weakness has significantly improved today, will repeat physical therapy evaluation as patient does not want any skilled nursing facility   Diabetes mellitus  - Continue sliding scale   HTN (hypertension):  - Continue lisinopril   Dehydration with ketones on UA  - Continue IV hydration for now  DVT prophylaxis: Lovenox   CODE STATUS: Full code   Disposition: Not medically ready  Subjective: Feeling a whole lo better today, per daughter she was able to ambulate to the bathroom with assistance. No tremors today, no CV angle tenderness.  Objective: Weight change:   Intake/Output Summary (Last 24 hours) at 06/11/12 1317 Last data filed at 06/10/12 2145  Gross per 24 hour  Intake    120 ml  Output      0 ml  Net    120 ml   Blood pressure 130/54, pulse 77, temperature 98.4 F (36.9 C), temperature source Oral, resp. rate 16, height 5\' 3"  (1.6 m), weight 80.377 kg (177 lb 3.2 oz), SpO2 92.00%.  Physical Exam: General: Alert and awake, oriented x3, not in any acute distress. CVS: S1-S2 clear, no murmur rubs or gallops Chest: CTA B. Abdomen: soft  nontender  nondistended normal bowel sounds  Extremities: no c/c/e, tremors   Lab Results: Basic Metabolic Panel:  Recent Labs Lab 06/10/12 0550 06/11/12 0630  NA 137 135  K 3.8 3.4*  CL 100 101  CO2 26 26  GLUCOSE 127* 137*  BUN 16 9  CREATININE 0.90 0.74  CALCIUM 8.4 8.1*   Liver Function Tests:  Recent Labs Lab 06/09/12 1229  AST 26  ALT 29  ALKPHOS 79  BILITOT 1.4*  PROT 7.6  ALBUMIN 3.5   CBC:  Recent Labs Lab 06/09/12 1229  06/10/12 0550 06/11/12 0630  WBC 10.3  < > 8.1 5.0  NEUTROABS 8.3*  --   --   --   HGB 12.5  < > 11.3* 11.0*  HCT 36.1  < > 32.1* 32.0*  MCV 90.0  < > 89.4 89.9  PLT 152  < > 144* 155  < > = values in this interval not displayed. Cardiac Enzymes: No results found for this basename: CKTOTAL, CKMB, CKMBINDEX, TROPONINI,  in the last 168 hours BNP: No components found with this basename: POCBNP,  CBG:  Recent Labs Lab 06/10/12 1223 06/10/12 1656 06/10/12 2203 06/11/12 0828 06/11/12 1210  GLUCAP 163* 179* 148* 134* 161*     Micro Results: Recent Results (from the past 240 hour(s))  URINE CULTURE     Status: None   Collection Time    06/09/12 12:21 PM      Result Value Range Status   Specimen Description URINE, CLEAN CATCH   Final  Special Requests NONE   Final   Culture  Setup Time 06/09/2012 13:20   Final   Colony Count >=100,000 COLONIES/ML   Final   Culture     Final   Value: Multiple bacterial morphotypes present, none predominant. Suggest appropriate recollection if clinically indicated.   Report Status 06/11/2012 FINAL   Final  URINE CULTURE     Status: None   Collection Time    06/09/12  9:58 PM      Result Value Range Status   Specimen Description URINE, RANDOM   Final   Special Requests NONE   Final   Culture  Setup Time 06/10/2012 03:29   Final   Colony Count 30,000 COLONIES/ML   Final   Culture     Final   Value: Multiple bacterial morphotypes present, none predominant. Suggest appropriate recollection if  clinically indicated.   Report Status 06/11/2012 FINAL   Final    Studies/Results: Mr Brain Wo Contrast  06/10/2012  *RADIOLOGY REPORT*  Clinical Data: Decreased strength in the range of motion. Decreased balance.  Diabetes.  MRI HEAD WITHOUT CONTRAST  Technique:  Multiplanar, multiecho pulse sequences of the brain and surrounding structures were obtained according to standard protocol without intravenous contrast.  Comparison: CT head without contrast 06/08/2008.  Findings: No acute infarct, hemorrhage, or mass lesion is present. Moderate periventricular and scattered subcortical T2 and FLAIR hyperintensities are greater than expected for age.  The ventricles are of normal size.  No significant extra-axial fluid collection is present.  Flow is present in the major intracranial arteries.  The patient is status post bilateral lens extractions.  Mild mucosal thickening is present in the maxillary sinuses bilaterally.  The paranasal sinuses and mastoid air cells are clear.  IMPRESSION:  1.  Moderate periventricular subcortical white matter disease is greater than expected for age. The finding is nonspecific but can be seen in the setting of chronic microvascular ischemia, a demyelinating process such as multiple sclerosis, vasculitis, complicated migraine headaches, or as the sequelae of a prior infectious or inflammatory process. 2.  No acute intracranial abnormality.   Original Report Authenticated By: Marin Roberts, M.D.    Ct Abdomen Pelvis W Contrast  06/10/2012  *RADIOLOGY REPORT*  Clinical Data: Lower abdominal pain  CT ABDOMEN AND PELVIS WITH CONTRAST  Technique:  Multidetector CT imaging of the abdomen and pelvis was performed following the standard protocol during bolus administration of intravenous contrast.  Contrast: OMNIPAQUE IOHEXOL 300 MG/ML  SOLN  Comparison:  None  Findings: Bibasilar opacities.  Normal heart size.  Aortic valve and coronary artery calcifications.  Hepatic  steatosis.  Unremarkable spleen, pancreas, adrenal glands. Absent gallbladder.  No biliary ductal dilatation.  Unremarkable left kidney.  Right renal edema, heterogeneous enhancement, and perinephric fat stranding.  Right periureteral fat stranding.  No hydronephrosis or hydroureter.  Colonic diverticulosis.  No CT evidence for colitis or diverticulitis.  Appendix within normal limits.  Small bowel loops are normal course and caliber.  Para esophageal calcifications presumably reflect sequelae of prior granulomatous infection.  Duodenal diverticulum.  No free intraperitoneal air or fluid.  No lymphadenopathy.  There is scattered atherosclerotic calcification of the aorta and its branches. No aneurysmal dilatation.  Mild perivesicular fat stranding and wall thickening.  Absent uterus.  No adnexal mass.  Multilevel degenerative changes of the imaged spine. No acute or aggressive appearing osseous lesion. T10 hemangioma.  IMPRESSION: CT findings are most in keeping with the clinical diagnosis of acute pyelonephritis.  No abscess.  Hepatic steatosis.  Bibasilar opacities; atelectasis versus infiltrate.  Colonic diverticulosis.   Original Report Authenticated By: Jearld Lesch, M.D.     Medications: Scheduled Meds: . cefTRIAXone (ROCEPHIN)  IV  1 g Intravenous Q24H  . enoxaparin (LOVENOX) injection  40 mg Subcutaneous Q24H  . insulin aspart  0-5 Units Subcutaneous QHS  . insulin aspart  0-9 Units Subcutaneous TID WC  . lisinopril  20 mg Oral Daily  . vancomycin  1,500 mg Intravenous Q24H      LOS: 2 days   RAI,RIPUDEEP M.D. [facility] 06/11/2012, 1:17 PM Pager: [phone]  If 7PM-7AM, please contact night-coverage [email or web address] Password [email or web address]

## 2012-06-12 LAB — BASIC METABOLIC PANEL
CO2: 24 mEq/L (ref 19–32)
Calcium: 8.3 mg/dL — ABNORMAL LOW (ref 8.4–10.5)
Creatinine, Ser: 0.6 mg/dL (ref 0.50–1.10)
Glucose, Bld: 120 mg/dL — ABNORMAL HIGH (ref 70–99)

## 2012-06-12 LAB — CBC
MCH: 31.4 pg (ref 26.0–34.0)
MCV: 89.5 fL (ref 78.0–100.0)
Platelets: 169 10*3/uL (ref 150–400)
RDW: 12.4 % (ref 11.5–15.5)

## 2012-06-12 MED ORDER — LEVOFLOXACIN 750 MG PO TABS: 750.0000 mg | ORAL_TABLET | Freq: Every day | ORAL | Status: DC

## 2012-06-12 NOTE — Discharge Summary (Signed)
Physician Discharge Summary  Patient ID: [name] MRN: [id number] DOB/AGE: 07/23/1934 78 y.o.  Admit date: 06/09/2012 Discharge date: 06/12/2012  Primary Care Physician:  Geraldo Pitter, MD  Discharge Diagnoses:    . Pyelonephritis, acute . UTI (urinary tract infection) . Diabetes mellitus . HTN (hypertension) . Dehydration  Consults: None   Discharge Medications:   Medication List    STOP taking these medications       nitrofurantoin (macrocrystal-monohydrate) 100 MG capsule  Commonly known as:  MACROBID      TAKE these medications       levofloxacin 750 MG tablet  Commonly known as:  LEVAQUIN  Take 1 tablet (750 mg total) by mouth daily. X 2 weeks     lisinopril-hydrochlorothiazide 20-12.5 MG per tablet  Commonly known as:  PRINZIDE,ZESTORETIC  Take 1 tablet by mouth daily.         Brief H and P: For complete details please refer to admission H and P, but in brief Patient is a 78 year old female with history of diabetes, hypertension, presented to the ED with fevers and back pain, suprapubic pain and dysuria. Patient stated that her symptoms started a month ago and she was diagnosed with UTI. Patient was treated with an antibiotic prescribed by her PCP however she does not remember the name of an antibiotic and her symptoms did not resolve. Patient followed up again last week with her PCP and was started on Macrobid, patient started taking Macrobid 2 days ago. A night before the admission, she started having fevers 101F, bilateral back and flank pain, suprapubic abdominal pain.  Patient lives with her daughter, who also reported that when she was having a fever, she noticed her mother to be confused.    Hospital Course:  Right-sided Pyelonephritis, acute likely secondary to recurrent UTI: Failure to outpatient oral antibiotic therapy: Patient was initially started on IV Rocephin only but she continued to spike fevers hence vancomycin was added. CT abdomen and  pelvis confirmed the right-sided pyelonephritis. Urine culture however did not show any growth as patient was already on antibiotics prior to admission but did show more than 100,000 colonies. Blood cultures remained negative so far. Patient was transitioned to oral levofloxacin and monitored closely. She did not spike any fevers or worsening leukocytosis and she tolerated oral levofloxacin well. She will continue  2 weeks of oral levofloxacin to treat pyelonephritis.   Generalized weakness: Due to dehydration and recurrent UTI patient had developed a significant generalized weakness and debility. MRI of the brain was obtained due to her gait instability and was negative for stroke HCTZ was discontinued inpatient and she was placed on IV fluids. Physical therapy evaluation was done, initially recommended skilled nursing facility which the patient refused. At the time of discharge, she is in the leading to the bathroom without any assistance, and does not want any home health PT or OT. Her daughter provides 24 hour supervision.   Diabetes mellitus: Diet controlled outpatient, patient was continued on sliding scale insulin while inpatient.    Day of Discharge BP 147/66  Pulse 69  Temp(Src) 98.1 F (36.7 C) (Oral)  Resp 17  Ht 5\' 3"  (1.6 m)  Wt 80.377 kg (177 lb 3.2 oz)  BMI 31.4 kg/m2  SpO2 95%  Physical Exam: General: Alert and awake oriented x3 not in any acute distress. CVS: S1-S2 clear no murmur rubs or gallops Chest: clear to auscultation bilaterally, no wheezing rales or rhonchi Abdomen: soft nontender, nondistended, normal bowel sounds, Extremities:  no cyanosis, clubbing or edema noted bilaterally    The results of significant diagnostics from this hospitalization (including imaging, microbiology, ancillary and laboratory) are listed below for reference.    LAB RESULTS: Basic Metabolic Panel:  Recent Labs Lab 06/11/12 0630 06/12/12 0410  NA 135 140  K 3.4* 3.5  CL 101 102   CO2 26 24  GLUCOSE 137* 120*  BUN 9 7  CREATININE 0.74 0.60  CALCIUM 8.1* 8.3*   Liver Function Tests:  Recent Labs Lab 06/09/12 1229  AST 26  ALT 29  ALKPHOS 79  BILITOT 1.4*  PROT 7.6  ALBUMIN 3.5   CBC:  Recent Labs Lab 06/09/12 1229  06/11/12 0630 06/12/12 0410  WBC 10.3  < > 5.0 4.0  NEUTROABS 8.3*  --   --   --   HGB 12.5  < > 11.0* 11.6*  HCT 36.1  < > 32.0* 33.1*  MCV 90.0  < > 89.9 89.5  PLT 152  < > 155 169  < > = values in this interval not displayed. Cardiac Enzymes: No results found for this basename: CKTOTAL, CKMB, CKMBINDEX, TROPONINI,  in the last 168 hours BNP: No components found with this basename: POCBNP,  CBG:  Recent Labs Lab 06/11/12 2203 06/12/12 0806  GLUCAP 151* 142*    Significant Diagnostic Studies:  Mr Brain Wo Contrast  06/10/2012  *RADIOLOGY REPORT*  Clinical Data: Decreased strength in the range of motion. Decreased balance.  Diabetes.  MRI HEAD WITHOUT CONTRAST  Technique:  Multiplanar, multiecho pulse sequences of the brain and surrounding structures were obtained according to standard protocol without intravenous contrast.  Comparison: CT head without contrast 06/08/2008.  Findings: No acute infarct, hemorrhage, or mass lesion is present. Moderate periventricular and scattered subcortical T2 and FLAIR hyperintensities are greater than expected for age.  The ventricles are of normal size.  No significant extra-axial fluid collection is present.  Flow is present in the major intracranial arteries.  The patient is status post bilateral lens extractions.  Mild mucosal thickening is present in the maxillary sinuses bilaterally.  The paranasal sinuses and mastoid air cells are clear.  IMPRESSION:  1.  Moderate periventricular subcortical white matter disease is greater than expected for age. The finding is nonspecific but can be seen in the setting of chronic microvascular ischemia, a demyelinating process such as multiple sclerosis,  vasculitis, complicated migraine headaches, or as the sequelae of a prior infectious or inflammatory process. 2.  No acute intracranial abnormality.   Original Report Authenticated By: Marin Roberts, M.D.    Ct Abdomen Pelvis W Contrast  06/10/2012  *RADIOLOGY REPORT*  Clinical Data: Lower abdominal pain  CT ABDOMEN AND PELVIS WITH CONTRAST  Technique:  Multidetector CT imaging of the abdomen and pelvis was performed following the standard protocol during bolus administration of intravenous contrast.  Contrast: OMNIPAQUE IOHEXOL 300 MG/ML  SOLN  Comparison:  None  Findings: Bibasilar opacities.  Normal heart size.  Aortic valve and coronary artery calcifications.  Hepatic steatosis.  Unremarkable spleen, pancreas, adrenal glands. Absent gallbladder.  No biliary ductal dilatation.  Unremarkable left kidney.  Right renal edema, heterogeneous enhancement, and perinephric fat stranding.  Right periureteral fat stranding.  No hydronephrosis or hydroureter.  Colonic diverticulosis.  No CT evidence for colitis or diverticulitis.  Appendix within normal limits.  Small bowel loops are normal course and caliber.  Para esophageal calcifications presumably reflect sequelae of prior granulomatous infection.  Duodenal diverticulum.  No free intraperitoneal air or fluid.  No lymphadenopathy.  There is scattered atherosclerotic calcification of the aorta and its branches. No aneurysmal dilatation.  Mild perivesicular fat stranding and wall thickening.  Absent uterus.  No adnexal mass.  Multilevel degenerative changes of the imaged spine. No acute or aggressive appearing osseous lesion. T10 hemangioma.  IMPRESSION: CT findings are most in keeping with the clinical diagnosis of acute pyelonephritis.  No abscess.  Hepatic steatosis.  Bibasilar opacities; atelectasis versus infiltrate.  Colonic diverticulosis.   Original Report Authenticated By: Jearld Lesch, M.D.        Disposition and Follow-up:      Discharge Orders   Future Orders Complete By Expires     Diet Carb Modified  As directed     Increase activity slowly  As directed         DISPOSITION: Home  DIET:Modified diet  ACTIVITY: As tolerated   DISCHARGE FOLLOW-UP Follow-up Information   Follow up with Geraldo Pitter, MD. Schedule an appointment as soon as possible for a visit in 2 weeks. (for hospital follow-up)    Contact information:   [address] [place] [address] [phone]       Time spent on Discharge: 33 mins  Signed:   Arlanda Shiplett M.D. [facility] 06/12/2012, 9:34 AM Pager: [phone]

## 2012-06-12 NOTE — Progress Notes (Signed)
Pt for discharge today, Levaquin given as per order.  All discharge instructions reviewed with pt and copy given.  Rx for levaquin given and explained.

## 2012-06-16 LAB — CULTURE, BLOOD (ROUTINE X 2): Culture: NO GROWTH

## 2013-11-24 ENCOUNTER — Other Ambulatory Visit: Payer: Self-pay | Admitting: Orthopedic Surgery

## 2013-11-24 ENCOUNTER — Ambulatory Visit
Admission: RE | Admit: 2013-11-24 | Discharge: 2013-11-24 | Disposition: A | Discharge status: Home / Self Care | Payer: Commercial Managed Care - HMO | Source: Ambulatory Visit | Attending: Orthopedic Surgery | Admitting: Orthopedic Surgery

## 2013-11-24 DIAGNOSIS — G629 Polyneuropathy, unspecified: Secondary | ICD-10-CM

## 2015-11-20 ENCOUNTER — Encounter (HOSPITAL_COMMUNITY): Payer: Self-pay | Admitting: Emergency Medicine

## 2015-11-20 ENCOUNTER — Emergency Department (HOSPITAL_COMMUNITY)
Admission: EM | Admit: 2015-11-20 | Discharge: 2015-11-21 | Disposition: A | Discharge status: Home / Self Care | Payer: Medicare (Managed Care) | Attending: Emergency Medicine | Admitting: Emergency Medicine

## 2015-11-20 DIAGNOSIS — W1809XA Striking against other object with subsequent fall, initial encounter: Secondary | ICD-10-CM | POA: Diagnosis not present

## 2015-11-20 DIAGNOSIS — S0093XA Contusion of unspecified part of head, initial encounter: Secondary | ICD-10-CM | POA: Diagnosis not present

## 2015-11-20 DIAGNOSIS — R600 Localized edema: Secondary | ICD-10-CM | POA: Insufficient documentation

## 2015-11-20 DIAGNOSIS — I10 Essential (primary) hypertension: Secondary | ICD-10-CM | POA: Insufficient documentation

## 2015-11-20 DIAGNOSIS — Y9301 Activity, walking, marching and hiking: Secondary | ICD-10-CM | POA: Insufficient documentation

## 2015-11-20 DIAGNOSIS — Y92007 Garden or yard of unspecified non-institutional (private) residence as the place of occurrence of the external cause: Secondary | ICD-10-CM | POA: Diagnosis not present

## 2015-11-20 DIAGNOSIS — Y999 Unspecified external cause status: Secondary | ICD-10-CM | POA: Insufficient documentation

## 2015-11-20 DIAGNOSIS — E119 Type 2 diabetes mellitus without complications: Secondary | ICD-10-CM | POA: Insufficient documentation

## 2015-11-20 DIAGNOSIS — Z79899 Other long term (current) drug therapy: Secondary | ICD-10-CM | POA: Insufficient documentation

## 2015-11-20 DIAGNOSIS — S0990XA Unspecified injury of head, initial encounter: Secondary | ICD-10-CM | POA: Diagnosis present

## 2015-11-20 DIAGNOSIS — T148XXA Other injury of unspecified body region, initial encounter: Secondary | ICD-10-CM

## 2015-11-20 DIAGNOSIS — W19XXXA Unspecified fall, initial encounter: Secondary | ICD-10-CM

## 2015-11-20 MED ORDER — ACETAMINOPHEN 325 MG PO TABS
650.0000 mg | ORAL_TABLET | Freq: Once | ORAL | Status: AC
  Administered 2015-11-21: 650 mg via ORAL
  Filled 2015-11-20: qty 2

## 2015-11-20 NOTE — ED Triage Notes (Signed)
Per EMS c/o fall. Pt states she lost her balance, hit her head on a rock. No LOC or blood thinners. Pt ambulatory and A&Ox4.

## 2015-11-20 NOTE — ED Notes (Signed)
Bed: [id number] Expected date:  Expected time:  Means of arrival:  Comments: EMS 81 yo female fall

## 2015-11-21 ENCOUNTER — Emergency Department (HOSPITAL_COMMUNITY): Payer: Medicare (Managed Care)

## 2015-11-21 MED ORDER — ACETAMINOPHEN 325 MG PO TABS: 650.0000 mg | ORAL_TABLET | Freq: Four times a day (QID) | ORAL | 0 refills | Status: DC | PRN

## 2015-11-21 NOTE — ED Provider Notes (Signed)
[facility]-EMERGENCY DEPT Provider Note   CSN: [id number] Arrival date & time: 11/20/15  2252     History   Chief Complaint Chief Complaint  Patient presents with  . Fall    HPI [name] is a 81 y.o. female.  The history is provided by the patient. No language interpreter was used.  Fall    [name] is a 81 y.o. female who presents to the Emergency Department complaining of fall.  She was walking outside today when she went to step over a rock in the yard and fell backwards, striking her head. She denies any loss of consciousness but does have headache and throbbing pain to the posterior head and upper neck. She denies any recent illnesses. She has a history of diabetes and chronic lower extremity edema. Her sister died after sustaining a head injury due to a fall. Symptoms are moderate and constant in nature. She denies any fevers, nausea, vomiting, numbness, weakness.  Past Medical History:  Diagnosis Date  . Diabetes mellitus without complication (HCC)   . Hypertension   . UTI (lower urinary tract infection)     Patient Active Problem List   Diagnosis Date Noted  . Pyelonephritis, acute 06/09/2012  . UTI (urinary tract infection) 06/09/2012  . Diabetes mellitus (HCC) 06/09/2012  . HTN (hypertension) 06/09/2012  . Dehydration 06/09/2012    History reviewed. No pertinent surgical history.  OB History    No data available       Home Medications    Prior to Admission medications   Medication Sig Start Date End Date Taking? Authorizing Provider  levofloxacin (LEVAQUIN) 750 MG tablet Take 1 tablet (750 mg total) by mouth daily. X 2 weeks 06/12/12   Ripudeep Jenna Luo, MD  lisinopril-hydrochlorothiazide (PRINZIDE,ZESTORETIC) 20-12.5 MG per tablet Take 1 tablet by mouth daily.    Historical Provider, MD    Family History No family history on file.  Social History Social History  Substance Use Topics  . Smoking status: Never Smoker  . Smokeless tobacco: Not on  file  . Alcohol use Not on file     Allergies   Aspirin   Review of Systems Review of Systems  All other systems reviewed and are negative.    Physical Exam Updated Vital Signs BP (!) 146/47 (BP Location: Left Arm)   Pulse 73   Temp 98.2 F (36.8 C) (Oral)   Resp 18   Ht 5\' 3"  (1.6 m)   Wt 173 lb (78.5 kg)   SpO2 96%   BMI 30.65 kg/m   Physical Exam  Constitutional: She is oriented to person, place, and time. She appears well-developed and well-nourished.  HENT:  Head: Normocephalic.  Tenderness to posterior scalp without any open wounds.  Neck:  Mild diffuse C-spine tenderness without any overt bony tenderness.  Cardiovascular: Normal rate and regular rhythm.   No murmur heard. Pulmonary/Chest: Effort normal and breath sounds normal. No respiratory distress.  Abdominal: Soft. There is no tenderness. There is no rebound and no guarding.  Musculoskeletal: She exhibits no tenderness.  2+ edema to bilateral lower extremities  Neurological: She is alert and oriented to person, place, and time.  5/5 strength in all four extremities  Skin: Skin is warm and dry.  Psychiatric: She has a normal mood and affect. Her behavior is normal.  Nursing note and vitals reviewed.    ED Treatments / Results  Labs (all labs ordered are listed, but only abnormal results are displayed) Labs Reviewed -  No data to display  EKG  EKG Interpretation None       Radiology Ct Head Wo Contrast  Result Date: 11/21/2015 CLINICAL DATA:  81 y/o F; status post fall and hit head on rock. No loss of consciousness. EXAM: CT HEAD WITHOUT CONTRAST CT CERVICAL SPINE WITHOUT CONTRAST TECHNIQUE: Multidetector CT imaging of the head and cervical spine was performed following the standard protocol without intravenous contrast. Multiplanar CT image reconstructions of the cervical spine were also generated. COMPARISON:  CT head 06/08/2008 and MR head 06/10/2012. FINDINGS: CT HEAD FINDINGS No displaced  skull fracture. Visualized orbits are unremarkable. Bilateral intra-ocular lens replacement. Aerosolized secretions in right sphenoid sinus, otherwise visualized paranasal sinuses and mastoids are clear. No evidence of large acute infarct, focal mass effect, intracranial hemorrhage, or hydrocephalus. Nonspecific white matter hypodensities, probably mild chronic microvascular ischemic changes. Mild parenchymal volume loss. No hyperdense vessel. Moderate calcific atherosclerosis of cavernous internal carotid arteries. CT CERVICAL SPINE FINDINGS Straightening of cervical lordosis. No listhesis. No acute fracture or dislocation is identified. No prevertebral soft tissue swelling. Vertebral body heights are preserved. There is disc space narrowing mild at the C5-6 and moderate at the C6-7 level. Uncovertebral and facet hypertrophy results in multilevel bony neural foraminal narrowing that is moderate at the bilateral C5-6, right C4-5, and moderate to severe at the left C3-4 levels. There are multiple disc osteophyte complexes the largest at C5-6. No high-grade canal stenosis is identified. No lymphadenopathy or discrete cervical mass is identified on this noncontrast examination. Nodule in the right posterior thyroid lobule measuring 15 millimeters, series 6, image 68. Suboptimal evaluation without intravenous contrast. Lung apices are clear. Minor septal thickening probably represents minimal interstitial edema. IMPRESSION: 1. No acute intracranial abnormality is identified. 2. No acute fracture, dislocation, or prevertebral soft tissue swelling of cervical spine. 3. Mild chronic microvascular ischemic changes of the brain and parenchymal volume loss. 4. Moderate cervical spondylosis greatest at the C3 through C6 levels. 5. **An incidental finding of potential clinical significance has been found. Nodularity in right posterior thyroid lobule measuring 15 millimeters, probably a thyroid nodule, possibly parathyroid  adenoma given posterior location. Thyroid ultrasound is recommended on a nonemergent basis. ** Electronically Signed   By: Mitzi HansenLance  Furusawa-Stratton M.D.   On: 11/21/2015 00:52   Ct Cervical Spine Wo Contrast  Result Date: 11/21/2015 CLINICAL DATA:  81 y/o F; status post fall and hit head on rock. No loss of consciousness. EXAM: CT HEAD WITHOUT CONTRAST CT CERVICAL SPINE WITHOUT CONTRAST TECHNIQUE: Multidetector CT imaging of the head and cervical spine was performed following the standard protocol without intravenous contrast. Multiplanar CT image reconstructions of the cervical spine were also generated. COMPARISON:  CT head 06/08/2008 and MR head 06/10/2012. FINDINGS: CT HEAD FINDINGS No displaced skull fracture. Visualized orbits are unremarkable. Bilateral intra-ocular lens replacement. Aerosolized secretions in right sphenoid sinus, otherwise visualized paranasal sinuses and mastoids are clear. No evidence of large acute infarct, focal mass effect, intracranial hemorrhage, or hydrocephalus. Nonspecific white matter hypodensities, probably mild chronic microvascular ischemic changes. Mild parenchymal volume loss. No hyperdense vessel. Moderate calcific atherosclerosis of cavernous internal carotid arteries. CT CERVICAL SPINE FINDINGS Straightening of cervical lordosis. No listhesis. No acute fracture or dislocation is identified. No prevertebral soft tissue swelling. Vertebral body heights are preserved. There is disc space narrowing mild at the C5-6 and moderate at the C6-7 level. Uncovertebral and facet hypertrophy results in multilevel bony neural foraminal narrowing that is moderate at the bilateral C5-6, right C4-5, and moderate  to severe at the left C3-4 levels. There are multiple disc osteophyte complexes the largest at C5-6. No high-grade canal stenosis is identified. No lymphadenopathy or discrete cervical mass is identified on this noncontrast examination. Nodule in the right posterior thyroid  lobule measuring 15 millimeters, series 6, image 68. Suboptimal evaluation without intravenous contrast. Lung apices are clear. Minor septal thickening probably represents minimal interstitial edema. IMPRESSION: 1. No acute intracranial abnormality is identified. 2. No acute fracture, dislocation, or prevertebral soft tissue swelling of cervical spine. 3. Mild chronic microvascular ischemic changes of the brain and parenchymal volume loss. 4. Moderate cervical spondylosis greatest at the C3 through C6 levels. 5. **An incidental finding of potential clinical significance has been found. Nodularity in right posterior thyroid lobule measuring 15 millimeters, probably a thyroid nodule, possibly parathyroid adenoma given posterior location. Thyroid ultrasound is recommended on a nonemergent basis. ** Electronically Signed   By: Mitzi HansenLance  Furusawa-Stratton M.D.   On: 11/21/2015 00:52    Procedures Procedures (including critical care time)  Medications Ordered in ED Medications  acetaminophen (TYLENOL) tablet 650 mg (650 mg Oral Given 11/21/15 0004)     Initial Impression / Assessment and Plan / ED Course  I have reviewed the triage vital signs and the nursing notes.  Pertinent labs & imaging results that were available during my care of the patient were reviewed by me and considered in my medical decision making (see chart for details).  Clinical Course    Patient here for evaluation of injuries following a mechanical fall. She has local tenderness to her posterior scalp. CT imaging with no evidence of acute intracranial abnormality. She does have chronic degenerative changes of her cervical spine. She is neurovascularly intact on examination. Discussed with patient home care following fall and head contusion. Discussed PCP follow-up as well as return precautions. CT scan does demonstrate incidental finding of thyroid nodule. Discussed with patient importance of following up with her family doctor for  further evaluation and testing.  Final Clinical Impressions(s) / ED Diagnoses   Final diagnoses:  Fall, initial encounter  Contusion    New Prescriptions New Prescriptions   No medications on file     Tilden FossaElizabeth De Jaworski, MD 11/21/15 0121

## 2015-11-21 NOTE — Discharge Instructions (Signed)
You have a nodule on your thyroid gland that will have to be followed up by your family doctor.

## 2015-12-10 ENCOUNTER — Other Ambulatory Visit (HOSPITAL_COMMUNITY): Payer: Self-pay | Admitting: Family Medicine

## 2015-12-10 DIAGNOSIS — E041 Nontoxic single thyroid nodule: Secondary | ICD-10-CM

## 2015-12-16 ENCOUNTER — Ambulatory Visit (HOSPITAL_COMMUNITY)
Admission: RE | Admit: 2015-12-16 | Discharge: 2015-12-16 | Disposition: A | Discharge status: Home / Self Care | Payer: 59 | Source: Ambulatory Visit | Attending: Family Medicine | Admitting: Family Medicine

## 2015-12-16 DIAGNOSIS — E041 Nontoxic single thyroid nodule: Secondary | ICD-10-CM

## 2016-07-16 DIAGNOSIS — Z0000 Encounter for general adult medical examination without abnormal findings: Secondary | ICD-10-CM | POA: Diagnosis not present

## 2016-07-22 DIAGNOSIS — E118 Type 2 diabetes mellitus with unspecified complications: Secondary | ICD-10-CM | POA: Diagnosis not present

## 2016-07-22 DIAGNOSIS — I10 Essential (primary) hypertension: Secondary | ICD-10-CM | POA: Diagnosis not present

## 2016-07-22 DIAGNOSIS — M130 Polyarthritis, unspecified: Secondary | ICD-10-CM | POA: Diagnosis not present

## 2016-09-21 IMAGING — US US THYROID
1 series · 13 of 25 positions shown · non-contrast
Comparison: Cervical spine CT 11/21/2015

CLINICAL DATA: Incidental on CT. Follow-up nodule seen on cervical
spine CT.

EXAM:
THYROID ULTRASOUND
TECHNIQUE: Ultrasound examination of the thyroid gland and adjacent soft
tissues was performed.

[Series 1: us thyroid · 0.08mm/px · 13 of 46 slices shown]
[im 1/46]
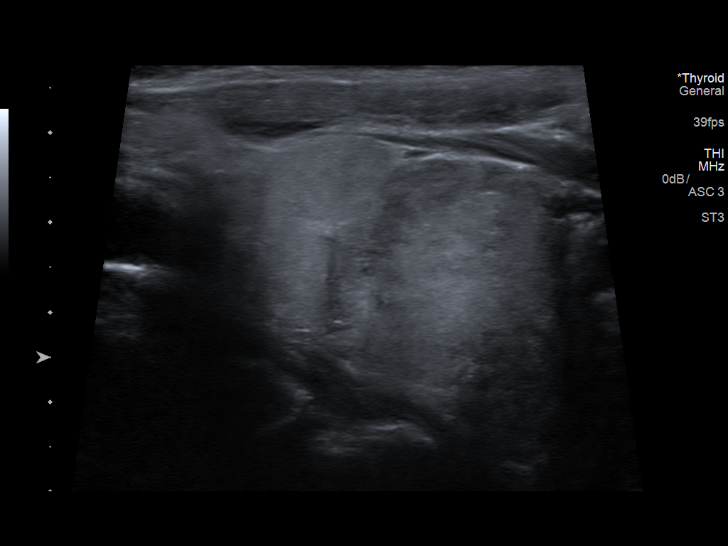
[im 4/46]
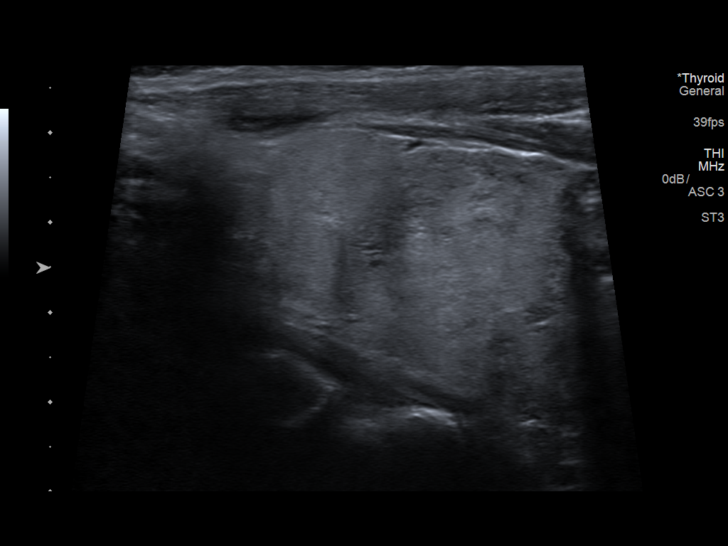
[im 8/46]
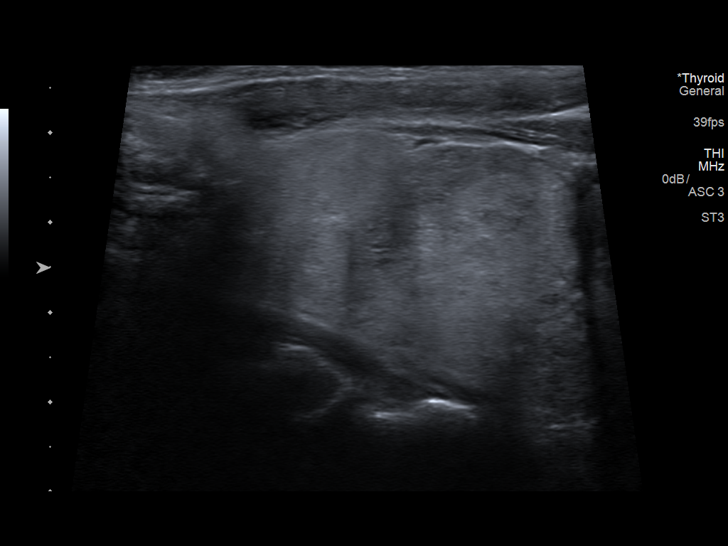
[im 12/46]
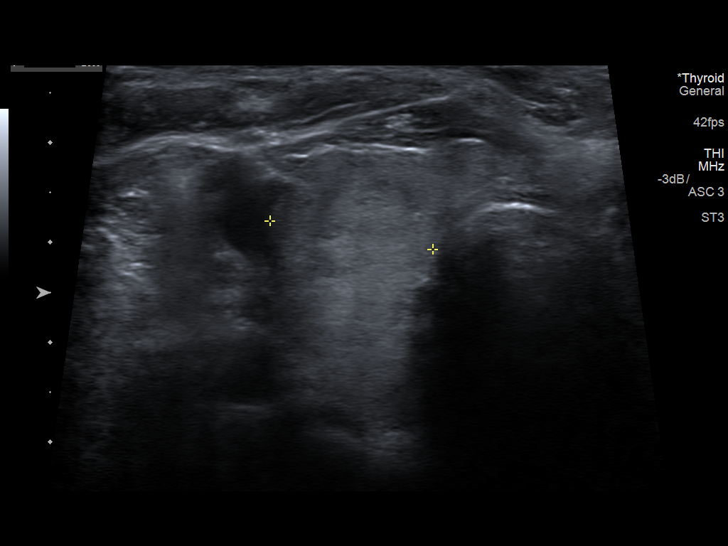
[im 16/46]
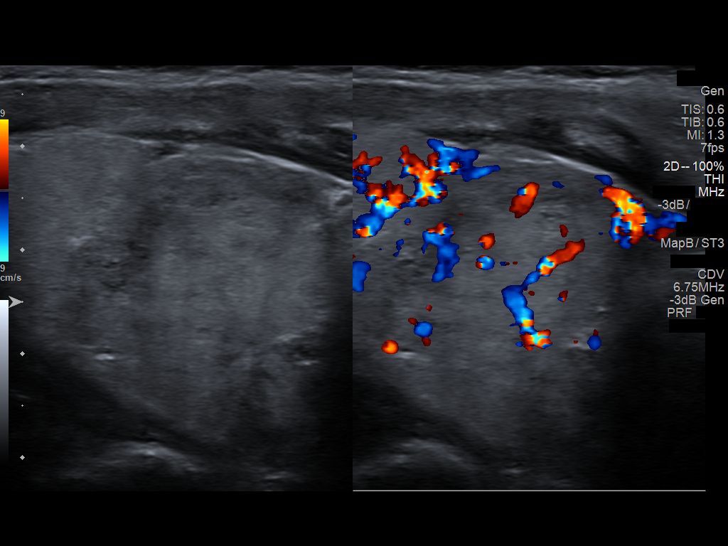
[im 19/46]
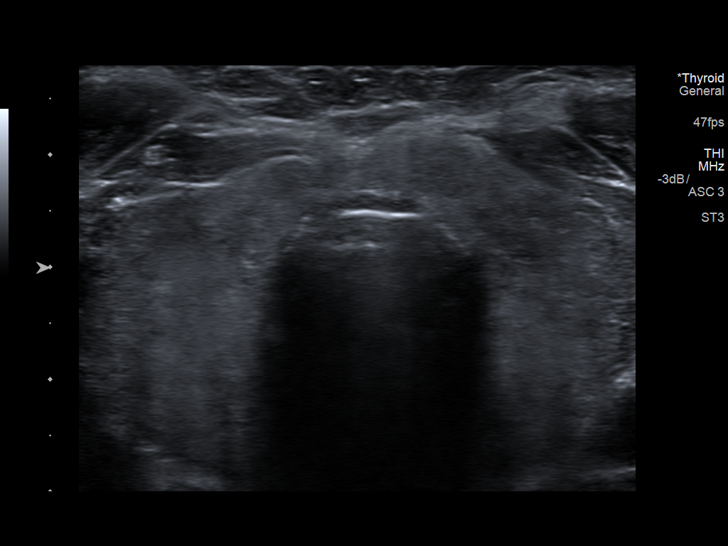
[im 23/46]
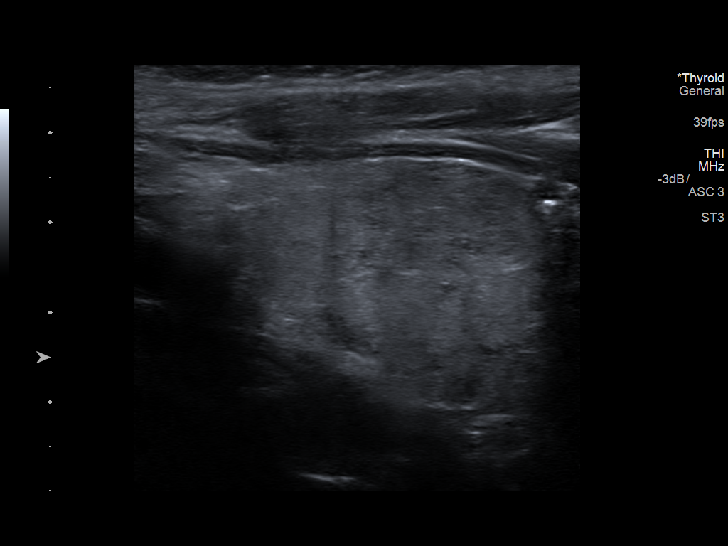
[im 27/46]
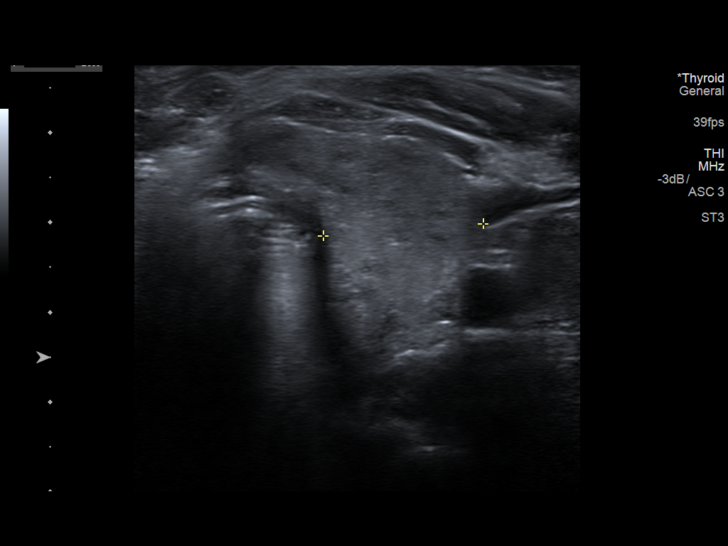
[im 31/46]
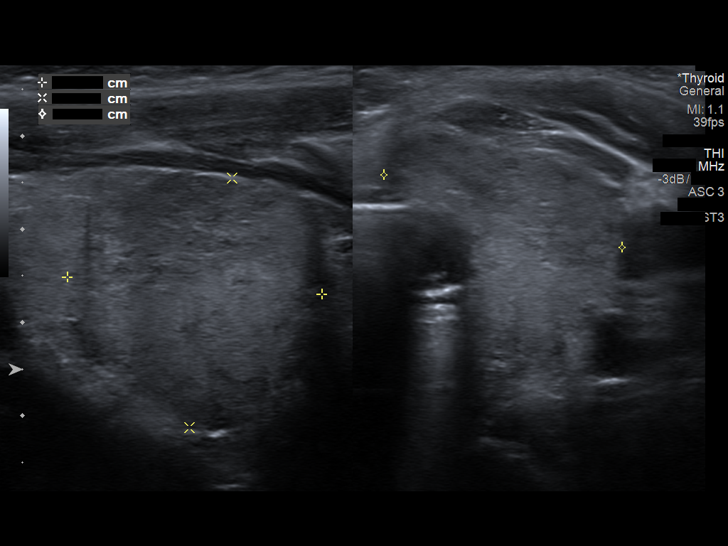
[im 34/46]
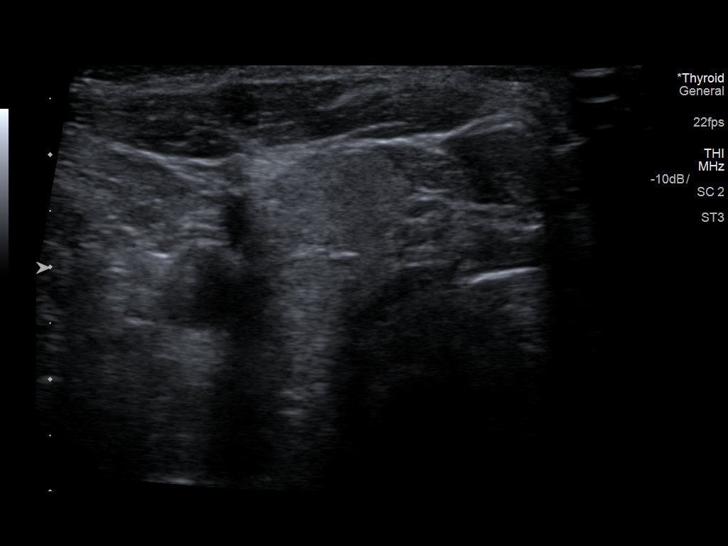
[im 38/46]
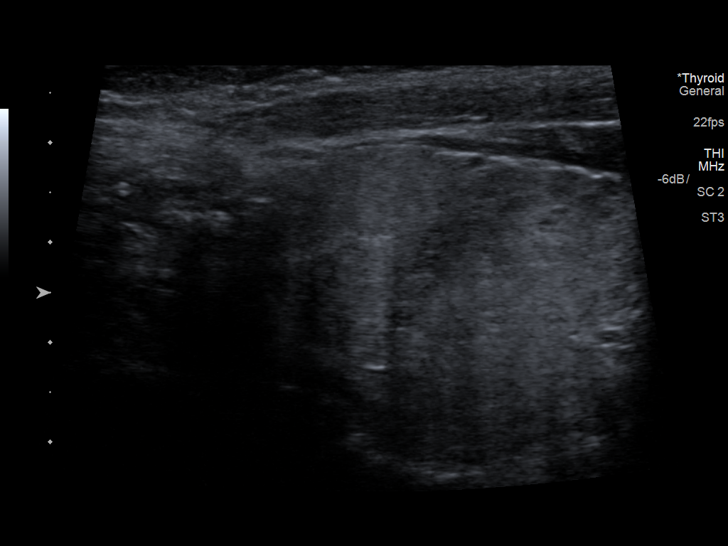
[im 42/46]
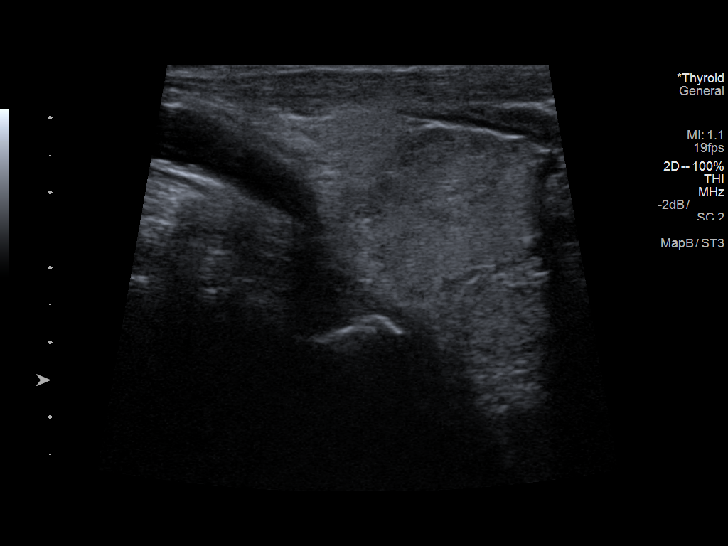
[im 46/46]
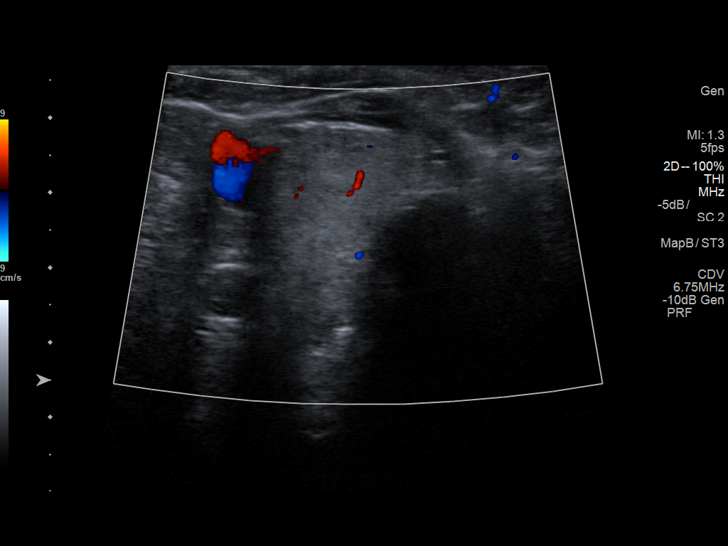

[13 of 25 positions shown; findings below may reference images not displayed]

FINDINGS: Parenchymal Echotexture: Mildly heterogenous

Estimated total number of nodules >/= 1 cm: 3

Number of spongiform nodules > 2 cm not described below (TR1): 0

Number of mixed cystic and solid nodules > 1.5 cm not described
below (TR2): 0

_________________________________________________________

Isthmus: 0.4 cm.

No discrete nodules are identified within the thyroid isthmus.

_________________________________________________________

Right lobe: Measures 5.4 x 3.4 x 1.7 cm

Nodule # 1:

Location: Right; Inferior

Size: Measures 2.9 x 2.7 x 2.1 cm.

Composition: solid/almost completely solid (2)

Echogenicity: isoechoic (1)

Shape: not taller-than-wide (0)

Margins: ill-defined (0)

Echogenic foci: none (0)

ACR TI-RADS total points: 3.

ACR TI-RADS risk category: TR3 (3 points).

ACR TI-RADS recommendations:

**Given size (>2.5 cm) and appearance, fine needle aspiration of
this mildly suspicious nodule should be considered based on TI-RADS
criteria.

Nodule # 2:

Location: Right; Inferior

Size: Measures 1.4 x 1.2 x 1.3 cm.

Composition: solid/almost completely solid (2)

Echogenicity: isoechoic (1)

Shape: not taller-than-wide (0)

Margins: ill-defined (0)

Echogenic foci: peripheral calcifications (2)

ACR TI-RADS total points: 5.

ACR TI-RADS risk category: TR4 (4-6 points).

ACR TI-RADS recommendations:

*Given size (1 - 1.5 cm) and appearance, a follow-up ultrasound in 1
year should be considered based on TI-RADS criteria.

_________________________________________________________

Left lobe: Measures 4.7 x 2.7 x 1.8 cm.

Nodule # 3:

Location: Left; Mid

Size: Measures 2.7 x 2.7 x 2.7 cm.

Composition: solid/almost completely solid (2)

Echogenicity: isoechoic (1)

Shape: not taller-than-wide (0)

Margins: ill-defined (0)

Echogenic foci: none (0)

ACR TI-RADS total points: 3.

ACR TI-RADS risk category: TR3 (3 points).

ACR TI-RADS recommendations:

**Given size (>2.5 cm) and appearance, fine needle aspiration of
this mildly suspicious nodule should be considered based on TI-RADS
criteria.
IMPRESSION: Bilateral thyroid nodules. Bilateral dominant nodules both meet
criteria for ultrasound-guided biopsy. The 1.4 cm nodule in the
inferior right thyroid lobe meets criteria for 1 year follow-up.

The above is in keeping with the ACR TI-RADS recommendations - [HOSPITAL] 0201;[DATE].

## 2016-11-19 DIAGNOSIS — E118 Type 2 diabetes mellitus with unspecified complications: Secondary | ICD-10-CM | POA: Diagnosis not present

## 2016-11-19 DIAGNOSIS — M130 Polyarthritis, unspecified: Secondary | ICD-10-CM | POA: Diagnosis not present

## 2016-11-19 DIAGNOSIS — R609 Edema, unspecified: Secondary | ICD-10-CM | POA: Diagnosis not present

## 2016-11-19 DIAGNOSIS — I10 Essential (primary) hypertension: Secondary | ICD-10-CM | POA: Diagnosis not present

## 2017-01-27 ENCOUNTER — Other Ambulatory Visit: Payer: Self-pay | Admitting: Family Medicine

## 2017-01-27 ENCOUNTER — Other Ambulatory Visit (HOSPITAL_COMMUNITY): Payer: Self-pay | Admitting: Family Medicine

## 2017-01-27 DIAGNOSIS — R1310 Dysphagia, unspecified: Secondary | ICD-10-CM

## 2017-02-02 ENCOUNTER — Ambulatory Visit (HOSPITAL_COMMUNITY)
Admission: RE | Admit: 2017-02-02 | Discharge: 2017-02-02 | Disposition: A | Discharge status: Home / Self Care | Payer: Medicare (Managed Care) | Source: Ambulatory Visit | Attending: Family Medicine | Admitting: Family Medicine

## 2017-02-02 ENCOUNTER — Encounter (HOSPITAL_COMMUNITY): Payer: Self-pay

## 2017-07-15 DIAGNOSIS — I10 Essential (primary) hypertension: Secondary | ICD-10-CM | POA: Diagnosis not present

## 2017-07-15 DIAGNOSIS — R0602 Shortness of breath: Secondary | ICD-10-CM | POA: Diagnosis not present

## 2017-07-15 DIAGNOSIS — M5381 Other specified dorsopathies, occipito-atlanto-axial region: Secondary | ICD-10-CM | POA: Diagnosis not present

## 2017-07-15 DIAGNOSIS — R609 Edema, unspecified: Secondary | ICD-10-CM | POA: Diagnosis not present

## 2017-07-15 DIAGNOSIS — E1165 Type 2 diabetes mellitus with hyperglycemia: Secondary | ICD-10-CM | POA: Diagnosis not present

## 2017-07-15 DIAGNOSIS — R601 Generalized edema: Secondary | ICD-10-CM | POA: Diagnosis not present

## 2017-10-14 ENCOUNTER — Emergency Department (HOSPITAL_COMMUNITY): Payer: Medicare Other

## 2017-10-14 ENCOUNTER — Other Ambulatory Visit: Payer: Self-pay

## 2017-10-14 ENCOUNTER — Encounter (HOSPITAL_COMMUNITY): Payer: Self-pay

## 2017-10-14 ENCOUNTER — Emergency Department (HOSPITAL_COMMUNITY)
Admission: EM | Admit: 2017-10-14 | Discharge: 2017-10-14 | Disposition: A | Discharge status: Home / Self Care | Payer: Medicare Other | Attending: Emergency Medicine | Admitting: Emergency Medicine

## 2017-10-14 DIAGNOSIS — E119 Type 2 diabetes mellitus without complications: Secondary | ICD-10-CM | POA: Insufficient documentation

## 2017-10-14 DIAGNOSIS — R0789 Other chest pain: Secondary | ICD-10-CM | POA: Diagnosis not present

## 2017-10-14 DIAGNOSIS — I10 Essential (primary) hypertension: Secondary | ICD-10-CM | POA: Diagnosis not present

## 2017-10-14 DIAGNOSIS — M7989 Other specified soft tissue disorders: Secondary | ICD-10-CM | POA: Diagnosis not present

## 2017-10-14 DIAGNOSIS — R079 Chest pain, unspecified: Secondary | ICD-10-CM | POA: Diagnosis not present

## 2017-10-14 DIAGNOSIS — J40 Bronchitis, not specified as acute or chronic: Secondary | ICD-10-CM

## 2017-10-14 DIAGNOSIS — R05 Cough: Secondary | ICD-10-CM | POA: Diagnosis not present

## 2017-10-14 LAB — CBC WITH DIFFERENTIAL/PLATELET
Abs Immature Granulocytes: 0 10*3/uL (ref 0.0–0.1)
BASOS PCT: 1 %
Basophils Absolute: 0 10*3/uL (ref 0.0–0.1)
EOS ABS: 0.1 10*3/uL (ref 0.0–0.7)
Eosinophils Relative: 1 %
HEMATOCRIT: 42.4 % (ref 36.0–46.0)
Hemoglobin: 13.9 g/dL (ref 12.0–15.0)
IMMATURE GRANULOCYTES: 1 %
LYMPHS PCT: 29 %
Lymphs Abs: 1.4 10*3/uL (ref 0.7–4.0)
MCH: 29.8 pg (ref 26.0–34.0)
MCHC: 32.8 g/dL (ref 30.0–36.0)
MCV: 91 fL (ref 78.0–100.0)
MONO ABS: 0.5 10*3/uL (ref 0.1–1.0)
MONOS PCT: 9 %
Neutro Abs: 3 10*3/uL (ref 1.7–7.7)
Neutrophils Relative %: 59 %
PLATELETS: 206 10*3/uL (ref 150–400)
RBC: 4.66 MIL/uL (ref 3.87–5.11)
RDW: 12.1 % (ref 11.5–15.5)
WBC: 5 10*3/uL (ref 4.0–10.5)

## 2017-10-14 LAB — COMPREHENSIVE METABOLIC PANEL
ALT: 13 U/L (ref 0–44)
AST: 13 U/L — AB (ref 15–41)
Albumin: 4 g/dL (ref 3.5–5.0)
Alkaline Phosphatase: 57 U/L (ref 38–126)
Anion gap: 9 (ref 5–15)
BILIRUBIN TOTAL: 0.7 mg/dL (ref 0.3–1.2)
BUN: 7 mg/dL — AB (ref 8–23)
CO2: 28 mmol/L (ref 22–32)
CREATININE: 0.71 mg/dL (ref 0.44–1.00)
Calcium: 9.4 mg/dL (ref 8.9–10.3)
Chloride: 102 mmol/L (ref 98–111)
GFR calc non Af Amer: >60 mL/min (ref >60)
Glucose, Bld: 250 mg/dL — ABNORMAL HIGH (ref 70–99)
POTASSIUM: 4.1 mmol/L (ref 3.5–5.1)
Sodium: 139 mmol/L (ref 135–145)
TOTAL PROTEIN: 7.9 g/dL (ref 6.5–8.1)

## 2017-10-14 LAB — D-DIMER, QUANTITATIVE (NOT AT ARMC): D DIMER QUANT: 0.99 ug{FEU}/mL — AB (ref 0.00–0.50)

## 2017-10-14 LAB — I-STAT TROPONIN, ED: Troponin i, poc: 0 ng/mL (ref 0.00–0.08)

## 2017-10-14 LAB — MAGNESIUM: MAGNESIUM: 2.1 mg/dL (ref 1.7–2.4)

## 2017-10-14 MED ORDER — METHOCARBAMOL 500 MG PO TABS
500.0000 mg | ORAL_TABLET | Freq: Once | ORAL | Status: AC
  Administered 2017-10-14: 500 mg via ORAL
  Filled 2017-10-14: qty 1

## 2017-10-14 MED ORDER — IOPAMIDOL (ISOVUE-370) INJECTION 76%
100.0000 mL | Freq: Once | INTRAVENOUS | Status: AC | PRN
  Administered 2017-10-14: 100 mL via INTRAVENOUS

## 2017-10-14 MED ORDER — TRAMADOL HCL 50 MG PO TABS: 50.0000 mg | ORAL_TABLET | Freq: Four times a day (QID) | ORAL | 0 refills | Status: DC | PRN

## 2017-10-14 MED ORDER — IOPAMIDOL (ISOVUE-370) INJECTION 76%
INTRAVENOUS | Status: AC
  Filled 2017-10-14: qty 100

## 2017-10-14 MED ORDER — TRAMADOL HCL 50 MG PO TABS
50.0000 mg | ORAL_TABLET | Freq: Once | ORAL | Status: AC
  Administered 2017-10-14: 50 mg via ORAL
  Filled 2017-10-14: qty 1

## 2017-10-14 MED ORDER — ACETAMINOPHEN 325 MG PO TABS: 650.0000 mg | ORAL_TABLET | Freq: Four times a day (QID) | ORAL | 0 refills | Status: DC | PRN

## 2017-10-14 MED ORDER — METHOCARBAMOL 1000 MG/10ML IJ SOLN
500.0000 mg | Freq: Once | INTRAMUSCULAR | Status: DC
  Filled 2017-10-14: qty 5

## 2017-10-14 MED ORDER — AZITHROMYCIN 250 MG PO TABS: 250.0000 mg | ORAL_TABLET | Freq: Every day | ORAL | 0 refills | Status: DC

## 2017-10-14 MED ORDER — METHOCARBAMOL 500 MG PO TABS: 500.0000 mg | ORAL_TABLET | Freq: Three times a day (TID) | ORAL | 0 refills | Status: DC | PRN

## 2017-10-14 NOTE — ED Triage Notes (Signed)
Pt states that she started have back pain about 3 days ago, reports that the pain went into herr chest today, she notified her PCP who advised she come here. Pt denies SOB, reports she has had a cough.

## 2017-10-14 NOTE — ED Provider Notes (Signed)
[facility] Provider Note   CSN: [id number] Arrival date & time: 10/14/17  1118     History   Chief Complaint Chief Complaint  Patient presents with  . Back Pain  . Chest Pain    HPI [name] is a 83 y.o. female.  HPI Patient presents with 3 days of right sided thoracic back pain which is now radiated to her right lower chest.  This corresponds with a "severe" cough which is productive of white sputum.  Pain is worse with movement and deep breathing.  She denies any known trauma.  No fever or chills.  She has had some lower extremity swelling which is not changed.  States she called to get an appointment with her primary physician who referred her to the emergency department. Past Medical History:  Diagnosis Date  . Diabetes mellitus without complication (HCC)   . Hypertension   . UTI (lower urinary tract infection)     Patient Active Problem List   Diagnosis Date Noted  . Pyelonephritis, acute 06/09/2012  . UTI (urinary tract infection) 06/09/2012  . Diabetes mellitus (HCC) 06/09/2012  . HTN (hypertension) 06/09/2012  . Dehydration 06/09/2012    History reviewed. No pertinent surgical history.   OB History   None      Home Medications    Prior to Admission medications   Medication Sig Start Date End Date Taking? Authorizing Provider  acetaminophen (TYLENOL) 325 MG tablet Take 2 tablets (650 mg total) by mouth every 6 (six) hours as needed for mild pain, moderate pain or headache. 10/14/17   Loren RacerYelverton, Kahliyah Dick, MD  azithromycin (ZITHROMAX) 250 MG tablet Take 1 tablet (250 mg total) by mouth daily. Take first 2 tablets together, then 1 every day until finished. 10/14/17   Loren RacerYelverton, Kalab Camps, MD  levofloxacin (LEVAQUIN) 750 MG tablet Take 1 tablet (750 mg total) by mouth daily. X 2 weeks Patient not taking: Reported on 10/14/2017 06/12/12   Rai, Delene Ruffiniipudeep K, MD  methocarbamol (ROBAXIN) 500 MG tablet Take 1 tablet (500 mg total) by  mouth every 8 (eight) hours as needed for muscle spasms. 10/14/17   Loren RacerYelverton, Roderica Cathell, MD  traMADol (ULTRAM) 50 MG tablet Take 1 tablet (50 mg total) by mouth every 6 (six) hours as needed for severe pain. 10/14/17   Loren RacerYelverton, Rhia Blatchford, MD    Family History No family history on file.  Social History Social History   Tobacco Use  . Smoking status: Never Smoker  . Smokeless tobacco: Never Used  Substance Use Topics  . Alcohol use: Never    Frequency: Never  . Drug use: Never     Allergies   Aspirin   Review of Systems Review of Systems  Constitutional: Negative for chills and fever.  HENT: Negative for sore throat and trouble swallowing.   Respiratory: Positive for cough. Negative for shortness of breath.   Cardiovascular: Positive for chest pain and leg swelling. Negative for palpitations.  Gastrointestinal: Negative for abdominal pain, constipation, diarrhea, nausea and vomiting.  Genitourinary: Positive for frequency. Negative for dysuria, flank pain and hematuria.  Musculoskeletal: Positive for back pain and myalgias. Negative for arthralgias and neck pain.  Skin: Negative for rash and wound.  Neurological: Negative for dizziness, weakness, light-headedness, numbness and headaches.  All other systems reviewed and are negative.    Physical Exam Updated Vital Signs BP (!) 117/45   Pulse 64   Temp 99.2 F (37.3 C) (Oral)   Resp 18   Ht 5'  2" (1.575 m)   Wt 81.2 kg (179 lb)   SpO2 94%   BMI 32.74 kg/m   Physical Exam  Constitutional: She is oriented to person, place, and time. She appears well-developed and well-nourished. No distress.  HENT:  Head: Normocephalic and atraumatic.  Mouth/Throat: Oropharynx is clear and moist. No oropharyngeal exudate.  Eyes: Pupils are equal, round, and reactive to light. EOM are normal.  Neck: Normal range of motion. Neck supple. No JVD present.  Cardiovascular: Normal rate and regular rhythm. Exam reveals no gallop and no friction  rub.  No murmur heard. Pulmonary/Chest: Effort normal and breath sounds normal. No stridor. No respiratory distress. She has no wheezes. She has no rales. She exhibits tenderness.  Patient with tenderness to palpation in the right inferior thoracic musculature and right inferior ribs.  No definite crepitance or deformity.  Abdominal: Soft. Bowel sounds are normal. There is no tenderness. There is no rebound and no guarding.  Musculoskeletal: Normal range of motion. She exhibits edema. She exhibits no tenderness.  3+ bilateral lower extremity pitting edema.  Distal pulses are 2+.  No asymmetry or tenderness present.  No midline thoracic or lumbar tenderness.  No CVA tenderness.  Lymphadenopathy:    She has no cervical adenopathy.  Neurological: She is alert and oriented to person, place, and time.  Moves all extremities without focal deficit.  Sensation intact.  Skin: Skin is warm and dry. Capillary refill takes less than 2 seconds. No rash noted. She is not diaphoretic. No erythema.  Psychiatric: She has a normal mood and affect. Her behavior is normal.  Nursing note and vitals reviewed.    ED Treatments / Results  Labs (all labs ordered are listed, but only abnormal results are displayed) Labs Reviewed  COMPREHENSIVE METABOLIC PANEL - Abnormal; Notable for the following components:      Result Value   Glucose, Bld 250 (*)    BUN 7 (*)    AST 13 (*)    All other components within normal limits  D-DIMER, QUANTITATIVE (NOT AT [facility]) - Abnormal; Notable for the following components:   D-Dimer, Quant 0.99 (*)    All other components within normal limits  CBC WITH DIFFERENTIAL/PLATELET  MAGNESIUM  I-STAT TROPONIN, ED    EKG EKG Interpretation  Date/Time:  Thursday October 14 2017 11:23:33 EDT Ventricular Rate:  78 PR Interval:  162 QRS Duration: 78 QT Interval:  382 QTC Calculation: 435 R Axis:   0 Text Interpretation:  Normal sinus rhythm Inferior infarct , age undetermined  Possible Anterior infarct , age undetermined Abnormal ECG Confirmed by Loren Racer ([id number]) on 10/14/2017 12:48:59 PM   Radiology Dg Chest 2 View  Result Date: 10/14/2017 CLINICAL DATA:  Mid right back pain along with lower right chest pain as well as heavy coughing for 3 days, also having a low grade fever - nonsmoker, no known heart or lung issues, hx of htn, diabetes EXAM: CHEST - 2 VIEW COMPARISON:  CT 10/27/2009, radiograph 10/25/2009 FINDINGS: Coarse linear scarring or subsegmental atelectasis in the lower lobes left greater than right as before. No new infiltrate or overt edema. Heart size and mediastinal contours are within normal limits. Aortic Atherosclerosis (ICD10-170.0). No effusion.  No pneumothorax. Visualized bones unremarkable.  Cholecystectomy clips. IMPRESSION: 1. Chronic appearing scarring or atelectasis in the lower lobes without definite acute or superimposed abnormality. Electronically Signed   By: Corlis Leak M.D.   On: 10/14/2017 11:58   Ct Angio Chest Pe W And/or Wo Contrast  Result Date: 10/14/2017 CLINICAL DATA:  Back pain starting 3 days ago, extending through to chest EXAM: CT ANGIOGRAPHY CHEST WITH CONTRAST TECHNIQUE: Multidetector CT imaging of the chest was performed using the standard protocol during bolus administration of intravenous contrast. Multiplanar CT image reconstructions and MIPs were obtained to evaluate the vascular anatomy. CONTRAST:  ISOVUE-370 IOPAMIDOL (ISOVUE-370) INJECTION 76% COMPARISON:  10/14/2017 chest radiograph.  Chest CT from 10/27/2009 FINDINGS: Cardiovascular: No filling defect is identified in the pulmonary arterial tree to suggest pulmonary embolus. Contrast medium in the systemic arterial vasculature is dilute which lowers sensitivity, but no obvious dissection or acute thoracic aortic abnormality is identified. Coronary, aortic arch, and branch vessel atherosclerotic vascular disease. Mild cardiomegaly. Mediastinum/Nodes: Borderline  prominent hilar and infrahilar lymph nodes bilaterally. Calcified mediastinal and right infrahilar lymph nodes compatible with old granulomatous disease. Lungs/Pleura: Mild biapical pleuroparenchymal scarring. 0.5 by 0.3 cm right upper lobe nodule on image 49/6, no change from 10/27/2009, considered benign. 5 mm right lower lobe nodule image 62/6, no appreciable change from 10/27/2009, considered benign. 3 mm right upper lobe nodule on image 39/6 likewise unchanged from 2011. 5 mm subpleural nodule in the left upper lobe on image 42/6, unchanged from 2011. Several other tiny nodules are likewise unchanged. Airway thickening is present, suggesting bronchitis or reactive airways disease. Small filling defect posteriorly in the left mainstem bronchus on image 49/6, probably mucus. There is some indistinct ground-glass opacity in the lung bases likely due to a combination of atelectasis and potentially faint alveolitis. Upper Abdomen: Unremarkable where included. Musculoskeletal: Thoracic spondylosis. Review of the MIP images confirms the above findings. IMPRESSION: 1. No filling defect is identified in the pulmonary arterial tree to suggest pulmonary embolus. 2. Aortic Atherosclerosis (ICD10-I70.0). Coronary atherosclerosis with mild cardiomegaly. 3. Airway thickening is present, suggesting bronchitis or reactive airways disease. Borderline prominent hilar and infrahilar lymph nodes are likely reactive. 4. Old granulomatous disease. 5. Several small noncalcified pulmonary nodules are unchanged from 2011 and considered benign. Electronically Signed   By: Gaylyn Rong M.D.   On: 10/14/2017 16:28    Procedures Procedures (including critical care time)  Medications Ordered in ED Medications  iopamidol (ISOVUE-370) 76 % injection (has no administration in time range)  iopamidol (ISOVUE-370) 76 % injection (has no administration in time range)  methocarbamol (ROBAXIN) injection 500 mg (has no administration  in time range)  traMADol (ULTRAM) tablet 50 mg (50 mg Oral Given 10/14/17 1246)  iopamidol (ISOVUE-370) 76 % injection 100 mL (100 mLs Intravenous Contrast Given 10/14/17 1604)     Initial Impression / Assessment and Plan / ED Course  I have reviewed the triage vital signs and the nursing notes.  Pertinent labs & imaging results that were available during my care of the patient were reviewed by me and considered in my medical decision making (see chart for details).     Elevated d-dimer.  CT anterior chest with evidence of bronchitis but no PE.  Chest pain is likely chest wall in origin.  Will treat symptomatically and treat with antibiotic.  She is advised to follow-up closely with her primary physician and return precautions have been given.  Final Clinical Impressions(s) / ED Diagnoses   Final diagnoses:  Chest wall pain  Bronchitis    ED Discharge Orders        Ordered    methocarbamol (ROBAXIN) 500 MG tablet  Every 8 hours PRN     10/14/17 1707    traMADol (ULTRAM) 50 MG tablet  Every  6 hours PRN     10/14/17 1707    acetaminophen (TYLENOL) 325 MG tablet  Every 6 hours PRN     10/14/17 1707    azithromycin (ZITHROMAX) 250 MG tablet  Daily     10/14/17 1707       Loren Racer, MD 10/14/17 1709

## 2017-10-27 ENCOUNTER — Other Ambulatory Visit: Payer: Self-pay

## 2017-10-27 ENCOUNTER — Ambulatory Visit: Payer: Self-pay | Admitting: Physician Assistant

## 2017-10-27 ENCOUNTER — Emergency Department (HOSPITAL_COMMUNITY)
Admission: EM | Admit: 2017-10-27 | Discharge: 2017-10-27 | Disposition: A | Discharge status: Home / Self Care | Payer: Medicare Other | Attending: Emergency Medicine | Admitting: Emergency Medicine

## 2017-10-27 ENCOUNTER — Encounter (HOSPITAL_COMMUNITY): Payer: Self-pay | Admitting: Emergency Medicine

## 2017-10-27 DIAGNOSIS — Z79899 Other long term (current) drug therapy: Secondary | ICD-10-CM | POA: Diagnosis not present

## 2017-10-27 DIAGNOSIS — R21 Rash and other nonspecific skin eruption: Secondary | ICD-10-CM | POA: Diagnosis present

## 2017-10-27 DIAGNOSIS — E119 Type 2 diabetes mellitus without complications: Secondary | ICD-10-CM | POA: Diagnosis not present

## 2017-10-27 DIAGNOSIS — B029 Zoster without complications: Secondary | ICD-10-CM | POA: Insufficient documentation

## 2017-10-27 DIAGNOSIS — I10 Essential (primary) hypertension: Secondary | ICD-10-CM | POA: Diagnosis not present

## 2017-10-27 LAB — I-STAT CHEM 8, ED
BUN: 16 mg/dL (ref 8–23)
Calcium, Ion: 0.99 mmol/L — ABNORMAL LOW (ref 1.15–1.40)
Chloride: 106 mmol/L (ref 98–111)
Creatinine, Ser: 0.6 mg/dL (ref 0.44–1.00)
Glucose, Bld: 243 mg/dL — ABNORMAL HIGH (ref 70–99)
HCT: 42 % (ref 36.0–46.0)
Hemoglobin: 14.3 g/dL (ref 12.0–15.0)
Potassium: 4.3 mmol/L (ref 3.5–5.1)
Sodium: 139 mmol/L (ref 135–145)
TCO2: 26 mmol/L (ref 22–32)

## 2017-10-27 LAB — CBG MONITORING, ED: Glucose-Capillary: 231 mg/dL — ABNORMAL HIGH (ref 70–99)

## 2017-10-27 MED ORDER — BLOOD GLUCOSE METER KIT: PACK | 0 refills | Status: DC

## 2017-10-27 MED ORDER — METFORMIN HCL 500 MG PO TABS: 500.0000 mg | ORAL_TABLET | Freq: Every day | ORAL | 0 refills | Status: DC

## 2017-10-27 MED ORDER — VALACYCLOVIR HCL 1 G PO TABS: 1000.0000 mg | ORAL_TABLET | Freq: Three times a day (TID) | ORAL | 0 refills | Status: AC

## 2017-10-27 MED ORDER — TRAMADOL HCL 50 MG PO TABS: 50.0000 mg | ORAL_TABLET | Freq: Four times a day (QID) | ORAL | 0 refills | Status: DC | PRN

## 2017-10-27 NOTE — ED Triage Notes (Signed)
Patient here for evaluation of painful rash and fluid filled blisters on right abdomen that wraps around to right back. States she has had the rash for two weeks, is concerned that it may be shingles. Fluid fill blisters are soft and not dried or draining. Patient alert, oriented, and in no apparent distress at this time.

## 2017-10-27 NOTE — Progress Notes (Deleted)
     MRN: [id number] DOB: 21-Nov-1934  Subjective:   [name] is a 83 y.o. female presenting for follow up on Hypertension.   Currently managed with ***. Patient {is/are not:32546} checking blood pressure at home, range is *** systolic. Reports ***. Denies lightheadedness, dizziness, chronic headache, double vision, chest pain, shortness of breath, heart racing, palpitations, nausea, vomiting, abdominal pain, hematuria, lower leg swelling. Lifestyle: Avoiding excessive salt intake. Trying to exercise on a regular basis. *** smoking, *** alcohol. Denies any other aggravating or relieving factors, no other questions or concerns.  Kassadi has a current medication list which includes the following prescription(s): acetaminophen, azithromycin, levofloxacin, methocarbamol, and tramadol. Also is allergic to aspirin.  [name]  has a past medical history of Diabetes mellitus without complication (HCC), Hypertension, and UTI (lower urinary tract infection). Also  has no past surgical history on file.   Objective:   Vitals: There were no vitals taken for this visit.  Physical Exam  No results found for this or any previous visit (from the past 24 hour(s)).  Assessment and Plan :  There are no diagnoses linked to this encounter.  Benjiman CoreBrittany Cammy Sanjurjo, PA-C  [facility] at [facility] 10/27/2017 7:34 AM

## 2017-10-27 NOTE — ED Provider Notes (Signed)
[facility] EMERGENCY DEPARTMENT Provider Note   CSN: [id number] Arrival date & time: 10/27/17  1428  History   Chief Complaint Chief Complaint  Patient presents with  . Wound Check    HPI [name] is a 83 y.o. female.  HPI     78 YOF presents today with complaints of rash to left flank. Pt reports the symptoms started 2 weeks ago. She notes the rash starts in the posterior back and wraps around the right ribs.  She notes itching and burning.  She notes using Ultram at home with some improvement in her symptoms.  She does state she has a primary care provider but they report they were no longer see her.  She was being seen in [place] but currently lives in [place].  She has a history of diabetes but has run out of her diabetic medication and could not get a refill for her primary care.  Patient notes she has not checked her blood sugar recently.  She denies any fever, denies any other rashes.  She notes she did have shingles as a kid.     Past Medical History:  Diagnosis Date  . Diabetes mellitus without complication ([place])   . Hypertension   . UTI (lower urinary tract infection)     Patient Active Problem List   Diagnosis Date Noted  . Pyelonephritis, acute 06/09/2012  . UTI (urinary tract infection) 06/09/2012  . Diabetes mellitus ([place]) 06/09/2012  . HTN (hypertension) 06/09/2012  . Dehydration 06/09/2012    History reviewed. No pertinent surgical history.   OB History   None      Home Medications    Prior to Admission medications   Medication Sig Start Date End Date Taking? Authorizing Provider  acetaminophen (TYLENOL) 325 MG tablet Take 2 tablets (650 mg total) by mouth every 6 (six) hours as needed for mild pain, moderate pain or headache. 10/14/17  Yes Julianne Rice, MD  blood glucose meter kit and supplies Dispense based on patient and insurance preference. Use up to four times daily as directed. (FOR ICD-10 E10.9, E11.9). 10/27/17    Zachry Hopfensperger, Dellis Filbert, PA-C  metFORMIN (GLUCOPHAGE) 500 MG tablet Take 1 tablet (500 mg total) by mouth daily with breakfast. 10/27/17   Rufus Beske, Dellis Filbert, PA-C  methocarbamol (ROBAXIN) 500 MG tablet Take 1 tablet (500 mg total) by mouth every 8 (eight) hours as needed for muscle spasms. Patient not taking: Reported on 10/27/2017 10/14/17   Julianne Rice, MD  traMADol (ULTRAM) 50 MG tablet Take 1 tablet (50 mg total) by mouth every 6 (six) hours as needed for severe pain. Patient not taking: Reported on 10/27/2017 10/14/17   Julianne Rice, MD  traMADol (ULTRAM) 50 MG tablet Take 1 tablet (50 mg total) by mouth every 6 (six) hours as needed. 10/27/17   Travor Royce, Dellis Filbert, PA-C  valACYclovir (VALTREX) 1000 MG tablet Take 1 tablet (1,000 mg total) by mouth 3 (three) times daily for 7 days. 10/27/17 11/03/17  Okey Regal, PA-C    Family History No family history on file.  Social History Social History   Tobacco Use  . Smoking status: Never Smoker  . Smokeless tobacco: Never Used  Substance Use Topics  . Alcohol use: Never    Frequency: Never  . Drug use: Never     Allergies   Aspirin   Review of Systems Review of Systems  All other systems reviewed and are negative.    Physical Exam Updated Vital Signs BP (!) 154/56 (BP  Location: Right Arm)   Pulse 82   Temp 98.1 F (36.7 C) (Oral)   Resp 16   SpO2 95%   Physical Exam  Constitutional: She is oriented to person, place, and time. She appears well-developed and well-nourished.  HENT:  Head: Normocephalic and atraumatic.  Eyes: Pupils are equal, round, and reactive to light. Conjunctivae are normal. Right eye exhibits no discharge. Left eye exhibits no discharge. No scleral icterus.  Neck: Normal range of motion. No JVD present. No tracheal deviation present.  Cardiovascular: Normal rate and regular rhythm.  Pulmonary/Chest: Effort normal and breath sounds normal. No stridor.  Neurological: She is alert and oriented to person,  place, and time. Coordination normal.  Skin:  Vesicular rash noted to the right thoracic back and lateral ribs-no left-sided rash remainder of dermatome without rash  Psychiatric: She has a normal mood and affect. Her behavior is normal. Judgment and thought content normal.  Nursing note and vitals reviewed.    ED Treatments / Results  Labs (all labs ordered are listed, but only abnormal results are displayed) Labs Reviewed  CBG MONITORING, ED - Abnormal; Notable for the following components:      Result Value   Glucose-Capillary 231 (*)    All other components within normal limits  I-STAT CHEM 8, ED - Abnormal; Notable for the following components:   Glucose, Bld 243 (*)    Calcium, Ion 0.99 (*)    All other components within normal limits    EKG None  Radiology No results found.  Procedures Procedures (including critical care time)  Medications Ordered in ED Medications - No data to display   Initial Impression / Assessment and Plan / ED Course  I have reviewed the triage vital signs and the nursing notes.  Pertinent labs & imaging results that were available during my care of the patient were reviewed by me and considered in my medical decision making (see chart for details).     Labs: I stat chem 8, POC CBG   Imaging:  Consults:  Therapeutics:  Discharge Meds: Valacyclovir, Ultram, metformin  Assessment/Plan: 83 year old female presents today with shingles.  This appears to be uncomplicated, no signs of disseminated illness.  Afebrile with no tachycardia.  Patient will be treated with antivirals.  Patient also notes that she is a diabetic but has not been able to get her medication from her primary care provider.  Patient will be restarted on metformin with encouragement to follow-up with her primary care, she is given strict return precautions, she verbalized understanding and agreement to today's plan had no further questions or concerns.    Final  Clinical Impressions(s) / ED Diagnoses   Final diagnoses:  Herpes zoster without complication  Type 2 diabetes mellitus without complication, without long-term current use of insulin [facility])    ED Discharge Orders        Ordered    valACYclovir (VALTREX) 1000 MG tablet  3 times daily     10/27/17 1628    traMADol (ULTRAM) 50 MG tablet  Every 6 hours PRN     10/27/17 1628    metFORMIN (GLUCOPHAGE) 500 MG tablet  Daily with breakfast     10/27/17 1628    blood glucose meter kit and supplies     10/27/17 1632       Okey Regal, PA-C 10/27/17 1846    Milton Ferguson, MD 10/30/17 [phone]

## 2017-10-27 NOTE — Discharge Instructions (Signed)
Please read attached information. If you experience any new or worsening signs or symptoms please return to the emergency room for evaluation. Please follow-up with your primary care provider or specialist as discussed. Please use medication prescribed only as directed and discontinue taking if you have any concerning signs or symptoms.   °

## 2017-11-10 ENCOUNTER — Inpatient Hospital Stay: Payer: Self-pay | Admitting: Physician Assistant

## 2017-11-10 NOTE — Progress Notes (Deleted)
    MRN: [id number]  Subjective:   [name] is a 83 y.o. female who presents to establish care and medication refill for Type 2 diabetes mellitus. Last seen twice in ED in 09/2017 for bronchitis and rash. Glucose elevated at 243. Had been out of metformin since ***. They gave her a refill and told her to f/u with PCP. Today, she reports ***. Last saw her prior PCP ***. Last A1C was ***. Has tried *** in the past.   Diagnosis was made ***. Patient is currently managed with {dm interventions:14074}. Admits {good/fair/poor:33178} compliance. Denies adverse effects including metallic taste, hypoglycemia, nausea, vomiting, ***.   Patient {is/are not:32546} checking home blood sugars. Home blood sugar records: {dm home sugars:14018}. Current symptoms include {dm sx:14075}. Patient denies {dm sx:19199}. Patient {is/are not:32546} checking their feet daily. *** foot concerns. Last diabetic eye exam eye exam ***.   Diet *** {diet habits:16563}. Exercise includes {exercise types:16438}. Denies smoking or alcohol use.   Known diabetic complications: {diabetes complications:1215}  Immunizations: Flu vaccine ***, shingles ***, pneumococal vaccine ***  Other concerns: ***   Objective:   PHYSICAL EXAM There were no vitals taken for this visit.  Physical Exam  Diabetic Foot Exam - Simple   No data filed      No results found for this or any previous visit (from the past 24 hour(s)).  Assessment and Plan :  There are no diagnoses linked to this encounter.  Benjiman CoreBrittany Krizia Flight, PA-C  Primary Care at [facility] 11/10/2017 7:17 AM

## 2017-12-15 ENCOUNTER — Ambulatory Visit: Payer: Self-pay | Admitting: Family Medicine

## 2017-12-29 ENCOUNTER — Ambulatory Visit (INDEPENDENT_AMBULATORY_CARE_PROVIDER_SITE_OTHER): Payer: Medicare Other | Admitting: Family Medicine

## 2017-12-29 ENCOUNTER — Encounter: Payer: Self-pay | Admitting: Family Medicine

## 2017-12-29 VITALS — BP 128/80 | HR 75 | Temp 98.8°F | Ht 62.0 in | Wt 169.0 lb

## 2017-12-29 DIAGNOSIS — Z7689 Persons encountering health services in other specified circumstances: Secondary | ICD-10-CM

## 2017-12-29 DIAGNOSIS — B0229 Other postherpetic nervous system involvement: Secondary | ICD-10-CM

## 2017-12-29 DIAGNOSIS — E119 Type 2 diabetes mellitus without complications: Secondary | ICD-10-CM | POA: Diagnosis not present

## 2017-12-29 DIAGNOSIS — L659 Nonscarring hair loss, unspecified: Secondary | ICD-10-CM | POA: Diagnosis not present

## 2017-12-29 MED ORDER — GABAPENTIN 100 MG PO CAPS: 100.0000 mg | ORAL_CAPSULE | Freq: Three times a day (TID) | ORAL | 3 refills | Status: DC

## 2017-12-29 MED ORDER — METFORMIN HCL 500 MG PO TABS: 500.0000 mg | ORAL_TABLET | Freq: Every day | ORAL | 3 refills | Status: DC

## 2017-12-29 NOTE — Patient Instructions (Signed)
Postherpetic Neuralgia Postherpetic neuralgia (PHN) is nerve pain that occurs after a shingles infection. Shingles is a painful rash that appears on one side of the body, usually on your trunk or face. Shingles is caused by the varicella-zoster virus. This is the same virus that causes chickenpox. In people who have had chickenpox, the virus can resurface years later and cause shingles. You may have PHN if you continue to have pain for 3 months after your shingles rash has gone away. PHN appears in the same area where you had the shingles rash. For most people, PHN goes away within 1 year. Getting a vaccination for shingles can prevent PHN. This vaccine is recommended for people older than 50. It may prevent shingles and may also lower your risk of PHN if you do get shingles. What are the causes? PHN is caused by damage to your nerves from the varicella-zoster virus. This damage makes your nerves overly sensitive. What increases the risk? Aging is the biggest risk factor for developing PHN. Most people who get PHN are older than 60. Other risk factors include:  Having very bad pain before your shingles rash starts.  Having a very bad rash.  Having shingles in the nerve that supplies your face and eye (trigeminal nerve).  What are the signs or symptoms? Pain is the main symptom of PHN. The pain is often very bad and may be described as stabbing, burning, or feeling like an electric shock. The pain may come and go or may be there all the time. Pain may be triggered by light touches on the skin or changes in temperature. You may have itching along with the pain. How is this diagnosed? Your health care provider may diagnose PHN based on your symptoms and your history of shingles. Lab studies and other diagnostic tests are usually not needed. How is this treated? There is no cure for PHN. Treatment for PHN will focus on pain relief. Over-the-counter pain relievers do not usually relieve PHN pain. You  may need to work with a pain specialist. Treatment may include:  Antidepressant medicines to help with pain and improve sleep.  Antiseizure medicines to relieve nerve pain.  Strong pain relievers (opioids).  A numbing patch worn on the skin (lidocaine patch).  Follow these instructions at home: It may take a long time to recover from PHN. Work closely with your health care provider, and have a good support system at home.  Take all medicines as directed by your health care provider.  Wear loose, comfortable clothing.  Cover sensitive areas with a dressing to reduce friction from clothing rubbing on the area.  If cold does not make your pain worse, try applying a cool compress or cooling gel pack to the area.  Talk to your health care provider if you feel depressed or desperate. Living with long-term pain can be depressing.  Contact a health care provider if:  Your medicine is not helping.  You are struggling to manage your pain at home. This information is not intended to replace advice given to you by your health care provider. Make sure you discuss any questions you have with your health care provider. Document Released: 06/06/2002 Document Revised: 08/22/2015 Document Reviewed: 03/07/2013 Elsevier Interactive Patient Education  2018 [employer].  How to Avoid Diabetes Mellitus Problems You can take action to prevent or slow down problems that are caused by diabetes (diabetes mellitus). Following your diabetes plan and taking care of yourself can reduce your risk of serious or  life-threatening complications. Manage your diabetes  Follow instructions from your health care providers about managing your diabetes. Your diabetes may be managed by a team of health care providers who can teach you how to care for yourself and can answer questions that you have.  Educate yourself about your condition so you can make healthy choices about eating and physical activity.  Check your  blood sugar (glucose) levels as often as directed. Your health care provider will help you decide how often to check your blood glucose level depending on your treatment goals and how well you are meeting them.  Ask your health care provider if you should take low-dose aspirin daily and what dose is recommended for you. Taking low-dose aspirin daily is recommended to help prevent cardiovascular disease. Do not use nicotine or tobacco Do not use any products that contain nicotine or tobacco, such as cigarettes and e-cigarettes. If you need help quitting, ask your health care provider. Nicotine raises your risk for diabetes problems. If you quit using nicotine:  You will lower your risk for heart attack, stroke, nerve disease, and kidney disease.  Your cholesterol and blood pressure may improve.  Your blood circulation will improve.  Keep your blood pressure under control To control your blood pressure:  Follow instructions from your health care provider about meal planning, exercise, and medicines.  Make sure your health care provider checks your blood pressure at every medical visit.  A blood pressure reading consists of two numbers. Generally, the goal is to keep your top number (systolic pressure) at or below 130, and your bottom number (diastolic pressure) at or below 80. Your health care provider may recommend a lower target blood pressure. Your individualized target blood pressure is determined based on:  Your age.  Your medicines.  How long you have had diabetes.  Any other medical conditions you have.  Keep your cholesterol under control To control your cholesterol:  Follow instructions from your health care provider about meal planning, exercise, and medicines.  Have your cholesterol checked at least once a year.  You may be prescribed medicine to lower cholesterol (statin). If you are not taking a statin, ask your health care provider if you should be.  Controlling your  cholesterol may:  Help prevent heart disease and stroke. These are the most common health problems for people with diabetes.  Improve your blood flow.  Schedule and keep yearly physical exams and eye exams Your health care provider will tell you how often you need medical visits depending on your diabetes management plan. Keep all follow-up visits as directed. This is important so possible problems can be identified early and complications can be avoided or treated.  Every visit with your health care provider should include measuring your: ? Weight. ? Blood pressure. ? Blood glucose control.  Your A1c (hemoglobin A1c) level should be checked: ? At least 2 times a year, if you are meeting your treatment goals. ? 4 times a year, if you are not meeting treatment goals or if your treatment goals have changed.  Your blood lipids (lipid profile) should be checked yearly. You should also be checked yearly for protein in your urine (urine microalbumin).  If you have type 1 diabetes, get an eye exam 3-5 years after you are diagnosed, and then once a year after your first exam.  If you have type 2 diabetes, get an eye exam as soon as you are diagnosed, and then once a year after your first exam.  Keep your vaccines current It is recommended that you receive:  A flu (influenza) vaccine every year.  A pneumonia (pneumococcal) vaccine and a hepatitis B vaccine. If you are age 20 or older, you may get the pneumonia vaccine as a series of two separate shots.  Ask your health care provider which other vaccines may be recommended. Take care of your feet Diabetes may cause you to have poor blood circulation to your legs and feet. Because of this, taking care of your feet is very important. Diabetes can cause:  The skin on the feet to get thinner, break more easily, and heal more slowly.  Nerve damage in your legs and feet, which results in decreased feeling. You may not notice minor injuries that  could lead to serious problems.  To avoid foot problems:  Check your skin and feet every day for cuts, bruises, redness, blisters, or sores.  Schedule a foot exam with your health care provider once every year. This exam includes: ? Inspecting of the structure and skin of your feet. ? Checking the pulses and sensation in your feet.  Make sure that your health care provider performs a visual foot exam at every medical visit.  Take care of your teeth People with poorly controlled diabetes are more likely to have gum (periodontal) disease. Diabetes can make periodontal diseases harder to control. If not treated, periodontal diseases can lead to tooth loss. To prevent this:  Brush your teeth twice a day.  Floss at least once a day.  Visit your dentist 2 times a year.  Drink responsibly Limit alcohol intake to no more than 1 drink a day for nonpregnant women and 2 drinks a day for men. One drink equals 12 oz of beer, 5 oz of wine, or 1 oz of hard liquor. It is important to eat food when you drink alcohol to avoid low blood glucose (hypoglycemia). Avoid alcohol if you:  Have a history of alcohol abuse or dependence.  Are pregnant.  Have liver disease, pancreatitis, advanced neuropathy, or severe hypertriglyceridemia.  Lessen stress Living with diabetes can be stressful. When you are experiencing stress, your blood glucose may be affected in two ways:  Stress hormones may cause your blood glucose to rise.  You may be distracted from taking good care of yourself.  Be aware of your stress level and make changes to help you manage challenging situations. To lower your stress levels:  Consider joining a support group.  Do planned relaxation or meditation.  Do a hobby that you enjoy.  Maintain healthy relationships.  Exercise regularly.  Work with your health care provider or a mental health professional.  Summary  You can take action to prevent or slow down problems that  are caused by diabetes (diabetes mellitus). Following your diabetes plan and taking care of yourself can reduce your risk of serious or life-threatening complications.  Follow instructions from your health care providers about managing your diabetes. Your diabetes may be managed by a team of health care providers who can teach you how to care for yourself and can answer questions that you have.  Your health care provider will tell you how often you need medical visits depending on your diabetes management plan. Keep all follow-up visits as directed. This is important so possible problems can be identified early and complications can be avoided or treated. This information is not intended to replace advice given to you by your health care provider. Make sure you discuss any questions you have  with your health care provider. Document Released: 12/02/2010 Document Revised: 12/14/2015 Document Reviewed: 12/14/2015 Elsevier Interactive Patient Education  [employer].

## 2017-12-29 NOTE — [facility] Notes (Signed)
Patient presents to clinic today to establish care.  SUBJECTIVE: PMH: pt is an 83 yo female with pmh sig for DM II, h/o bronchitis, thyroid issues?.  Pt was previously seen by Lucianne Lei.  Side pain: -recent dx of shingles at the end of July -was given pain med in ED. -pt notes R back/side pain in the area of the rash. -Blisters have improved, hyperpigmented areas remain.  DM type II: -Pt taking metformin 500 mg daily -Occasionally checks FSBS after eating breakfast. -FS BS was 129 last night -Pt denies increased thirst, increased urination, increased hunger  Thyroid issues?: -Pt states her hair has been falling out more than normal. -Pt states her previous provider did not answer her questions in regards to her thyroid. -Pt is unsure if she has had recent blood work to check her thyroid.  H/o cough: -Patient endorses coughing at night -Endorses history of bronchitis.  Allergies: Aspirin-upsets stomach.  Pt states she can take a 1/2 of ASA without issue.   Pt denies EtOH, tobacco, or drug use. Pt has 3 cats and 2 dogs at home.  Health Maintenance: Dental -- needs to find a dentist  Past Medical History:  Diagnosis Date  . Diabetes mellitus without complication ([place])   . Emphysema of lung ([place])   . Hypertension   . Thyroid disease   . UTI (lower urinary tract infection)     Past Surgical History:  Procedure Laterality Date  . REPLACEMENT TOTAL KNEE Left     Current Outpatient Medications on File Prior to Visit  Medication Sig Dispense Refill  . acetaminophen (TYLENOL) 325 MG tablet Take 2 tablets (650 mg total) by mouth every 6 (six) hours as needed for mild pain, moderate pain or headache. 30 tablet 0  . metFORMIN (GLUCOPHAGE) 500 MG tablet Take 1 tablet (500 mg total) by mouth daily with breakfast. 30 tablet 0  . methocarbamol (ROBAXIN) 500 MG tablet Take 1 tablet (500 mg total) by mouth every 8 (eight) hours as needed for muscle spasms. 30 tablet 0  .  traMADol (ULTRAM) 50 MG tablet Take 1 tablet (50 mg total) by mouth every 6 (six) hours as needed for severe pain. 15 tablet 0  . blood glucose meter kit and supplies Dispense based on patient and insurance preference. Use up to four times daily as directed. (FOR ICD-10 E10.9, E11.9). (Patient not taking: Reported on 12/29/2017) 1 each 0  . traMADol (ULTRAM) 50 MG tablet Take 1 tablet (50 mg total) by mouth every 6 (six) hours as needed. (Patient not taking: Reported on 12/29/2017) 15 tablet 0   No current facility-administered medications on file prior to visit.     Allergies  Allergen Reactions  . Aspirin Other (See Comments)    Stomach cramp    Family History  Problem Relation Age of Onset  . Asthma Mother   . Cancer Father   . COPD Sister   . COPD Brother   . Asthma Brother     Social History   Socioeconomic History  . Marital status: Widowed    Spouse name: Not on file  . Number of children: Not on file  . Years of education: Not on file  . Highest education level: Not on file  Occupational History  . Not on file  Social Needs  . Financial resource strain: Not on file  . Food insecurity:    Worry: Not on file    Inability: Not on file  . Transportation needs:  Medical: Not on file    Non-medical: Not on file  Tobacco Use  . Smoking status: Never Smoker  . Smokeless tobacco: Never Used  Substance and Sexual Activity  . Alcohol use: Never    Frequency: Never  . Drug use: Never  . Sexual activity: Not on file  Lifestyle  . Physical activity:    Days per week: Not on file    Minutes per session: Not on file  . Stress: Not on file  Relationships  . Social connections:    Talks on phone: Not on file    Gets together: Not on file    Attends religious service: Not on file    Active member of club or organization: Not on file    Attends meetings of clubs or organizations: Not on file    Relationship status: Not on file  . Intimate partner violence:    Fear of  current or ex partner: Not on file    Emotionally abused: Not on file    Physically abused: Not on file    Forced sexual activity: Not on file  Other Topics Concern  . Not on file  Social History Narrative  . Not on file    ROS General: Denies fever, chills, night sweats, changes in weight, changes in appetite  +hair loss HEENT: Denies headaches, ear pain, changes in vision, rhinorrhea, sore throat CV: Denies CP, palpitations, SOB, orthopnea Pulm: Denies SOB, cough, wheezing GI: Denies abdominal pain, nausea, vomiting, diarrhea, constipation GU: Denies dysuria, hematuria, frequency, vaginal discharge Msk: Denies muscle cramps, joint pains  +R back/flank pain Neuro: Denies weakness, numbness, tingling  +tingling/burning at site of shingles outbreak Skin: Denies rashes, bruising Psych: Denies depression, anxiety, hallucinations  BP 128/80 (BP Location: Left Arm, Patient Position: Sitting, Cuff Size: Normal)   Pulse 75   Temp 98.8 F (37.1 C) (Oral)   Ht _0  (1.575 m)   Wt 169 lb (76.7 kg)   SpO2 95%   BMI 30.91 kg/m   Physical Exam Gen. Pleasant, well developed, well-nourished, malodorous scent present, in NAD,  HEENT - thinning hair, [place]/AT, poor dentition, several broken teeth with carries noted. no scleral icterus, no nasal drainage, pharynx without erythema or exudate. Lungs: no use of accessory muscles, CTAB, no wheezes, rales or rhonchi Cardiovascular: RRR, No r/g/m Abdomen: BS present, soft, nontender,nondistended Neuro:  A&Ox3, CN II-XII intact, normal gait   Recent Results (from the past 2160 hour(s))  CBC with Differential/Platelet     Status: None   Collection Time: 10/14/17 11:29 AM  Result Value Ref Range   WBC 5.0 4.0 - 10.5 K/uL   RBC 4.66 3.87 - 5.11 MIL/uL   Hemoglobin 13.9 12.0 - 15.0 g/dL   HCT 42.4 36.0 - 46.0 %   MCV 91.0 78.0 - 100.0 fL   MCH 29.8 26.0 - 34.0 pg   MCHC 32.8 30.0 - 36.0 g/dL   RDW 12.1 11.5 - 15.5 %   Platelets 206 150 - 400  K/uL   Neutrophils Relative % 59 %   Neutro Abs 3.0 1.7 - 7.7 K/uL   Lymphocytes Relative 29 %   Lymphs Abs 1.4 0.7 - 4.0 K/uL   Monocytes Relative 9 %   Monocytes Absolute 0.5 0.1 - 1.0 K/uL   Eosinophils Relative 1 %   Eosinophils Absolute 0.1 0.0 - 0.7 K/uL   Basophils Relative 1 %   Basophils Absolute 0.0 0.0 - 0.1 K/uL   Immature Granulocytes 1 %   Abs  Immature Granulocytes 0.0 0.0 - 0.1 K/uL    Comment: Performed at [facility], [place] [address], [place], [place] [address]  Comprehensive metabolic panel     Status: Abnormal   Collection Time: 10/14/17 11:29 AM  Result Value Ref Range   Sodium 139 135 - 145 mmol/L   Potassium 4.1 3.5 - 5.1 mmol/L   Chloride 102 98 - 111 mmol/L    Comment: Please note change in reference range.   CO2 28 22 - 32 mmol/L   Glucose, Bld 250 (H) 70 - 99 mg/dL    Comment: Please note change in reference range.   BUN 7 (L) 8 - 23 mg/dL    Comment: Please note change in reference range.   Creatinine, Ser 0.71 0.44 - 1.00 mg/dL   Calcium 9.4 8.9 - 10.3 mg/dL   Total Protein 7.9 6.5 - 8.1 g/dL   Albumin 4.0 3.5 - 5.0 g/dL   AST 13 (L) 15 - 41 U/L   ALT 13 0 - 44 U/L    Comment: Please note change in reference range.   Alkaline Phosphatase 57 38 - 126 U/L   Total Bilirubin 0.7 0.3 - 1.2 mg/dL   GFR calc non Af Amer >60 >60 mL/min   GFR calc Af Amer >60 >60 mL/min    Comment: (NOTE) The eGFR has been calculated using the CKD EPI equation. This calculation has not been validated in all clinical situations. eGFR's persistently <60 mL/min signify possible Chronic Kidney Disease.    Anion gap 9 5 - 15    Comment: Performed at [facility] [address], [place], [place] [address]  Magnesium     Status: None   Collection Time: 10/14/17 11:29 AM  Result Value Ref Range   Magnesium 2.1 1.7 - 2.4 mg/dL    Comment: Performed at [facility] [address] [place], [place] [address]  D-dimer, quantitative (not at [facility])     Status:  Abnormal   Collection Time: 10/14/17 11:29 AM  Result Value Ref Range   D-Dimer, Quant 0.99 (H) 0.00 - 0.50 ug/mL-FEU    Comment: (NOTE) At the manufacturer cut-off of 0.50 ug/mL FEU, this assay has been documented to exclude PE with a sensitivity and negative predictive value of 97 to 99%.  At this time, this assay has not been approved by the FDA to exclude DVT/VTE. Results should be correlated with clinical presentation. Performed at [facility], [place] [address], [place], [place] [address]   I-stat troponin, ED     Status: None   Collection Time: 10/14/17 11:32 AM  Result Value Ref Range   Troponin i, poc 0.00 0.00 - 0.08 ng/mL   Comment 3            Comment: Due to the release kinetics of cTnI, a negative result within the first hours of the onset of symptoms does not rule out myocardial infarction with certainty. If myocardial infarction is still suspected, repeat the test at appropriate intervals.   POC CBG, ED     Status: Abnormal   Collection Time: 10/27/17  3:00 PM  Result Value Ref Range   Glucose-Capillary 231 (H) 70 - 99 mg/dL  I-Stat Chem 8, ED     Status: Abnormal   Collection Time: 10/27/17  3:40 PM  Result Value Ref Range   Sodium 139 135 - 145 mmol/L   Potassium 4.3 3.5 - 5.1 mmol/L   Chloride 106 98 -  111 mmol/L   BUN 16 8 - 23 mg/dL   Creatinine, Ser 0.60 0.44 - 1.00 mg/dL   Glucose, Bld 243 (H) 70 - 99 mg/dL   Calcium, Ion 0.99 (L) 1.15 - 1.40 mmol/L   TCO2 26 22 - 32 mmol/L   Hemoglobin 14.3 12.0 - 15.0 g/dL   HCT 42.0 36.0 - 46.0 %    Assessment/Plan: Type 2 diabetes mellitus without complication, without long-term current use of insulin (HCC)  -discussed checking fsbs first thing in the am as well as at night. -given handouts - Plan: metFORMIN (GLUCOPHAGE) 500 MG tablet, Hemoglobin A1c  Postherpetic neuralgia  -R sided back/flank pain likely 2/2 postherpetic neuralgia -discussed likely dz course -given handout - Plan: gabapentin  (NEURONTIN) 100 MG capsule  Hair loss  - Plan: TSH, T4, free  Encounter to establish care -We reviewed the PMH, PSH, FH, SH, Meds and Allergies. -We provided refills for any medications we will prescribe as needed. -We addressed current concerns per orders and patient instructions. -We have asked for records for pertinent exams, studies, vaccines and notes from previous providers. -We have advised patient to follow up per instructions below.  F/u prn in the next few months  Grier Mitts, MD

## 2017-12-30 LAB — HEMOGLOBIN A1C: HEMOGLOBIN A1C: 9.1 % — AB (ref 4.6–6.5)

## 2017-12-30 LAB — TSH: TSH: 3.05 u[IU]/mL (ref 0.35–4.50)

## 2017-12-30 LAB — T4, FREE: FREE T4: 0.82 ng/dL (ref 0.60–1.60)

## 2017-12-31 ENCOUNTER — Telehealth: Payer: Self-pay | Admitting: Family Medicine

## 2017-12-31 NOTE — Telephone Encounter (Signed)
Copied from CRM [id number]. Topic: Quick Communication - See Telephone Encounter >> Dec 31, 2017 11:04 AM Luanna Cole wrote: CRM for notification. See Telephone encounter for: 12/31/17. Pt called and stated that Dr banks was going to call multiple medication in for her. She states she does not know all the names of them. She would like a call back from nurse. Please advise

## 2018-01-03 NOTE — Telephone Encounter (Signed)
Spoke with pt voiced understanding that Rx for Gabapentin/ Metformin was sent to her pharmacy.

## 2018-01-31 ENCOUNTER — Other Ambulatory Visit: Payer: Self-pay | Admitting: Internal Medicine

## 2018-01-31 DIAGNOSIS — E041 Nontoxic single thyroid nodule: Secondary | ICD-10-CM

## 2018-01-31 DIAGNOSIS — E2839 Other primary ovarian failure: Secondary | ICD-10-CM

## 2018-02-03 ENCOUNTER — Ambulatory Visit
Admission: RE | Admit: 2018-02-03 | Discharge: 2018-02-03 | Disposition: A | Discharge status: Home / Self Care | Payer: Medicare Other | Source: Ambulatory Visit | Attending: Internal Medicine | Admitting: Internal Medicine

## 2018-02-03 DIAGNOSIS — E041 Nontoxic single thyroid nodule: Secondary | ICD-10-CM

## 2018-02-04 ENCOUNTER — Other Ambulatory Visit: Payer: Self-pay | Admitting: Internal Medicine

## 2018-02-04 DIAGNOSIS — R5381 Other malaise: Secondary | ICD-10-CM

## 2018-02-04 DIAGNOSIS — E041 Nontoxic single thyroid nodule: Secondary | ICD-10-CM

## 2018-02-18 ENCOUNTER — Other Ambulatory Visit: Payer: Self-pay | Admitting: Internal Medicine

## 2018-02-18 DIAGNOSIS — Z780 Asymptomatic menopausal state: Secondary | ICD-10-CM

## 2018-02-21 ENCOUNTER — Other Ambulatory Visit: Payer: Medicare Other

## 2018-02-22 ENCOUNTER — Other Ambulatory Visit (HOSPITAL_COMMUNITY): Payer: Medicare Other

## 2018-03-01 ENCOUNTER — Other Ambulatory Visit: Payer: Self-pay

## 2018-03-01 ENCOUNTER — Ambulatory Visit (HOSPITAL_COMMUNITY): Payer: Medicare Other | Attending: Cardiology

## 2018-03-01 ENCOUNTER — Other Ambulatory Visit (HOSPITAL_COMMUNITY): Payer: Self-pay | Admitting: Family

## 2018-03-01 DIAGNOSIS — R011 Cardiac murmur, unspecified: Secondary | ICD-10-CM | POA: Diagnosis not present

## 2018-03-17 ENCOUNTER — Other Ambulatory Visit (HOSPITAL_COMMUNITY)
Admission: RE | Admit: 2018-03-17 | Discharge: 2018-03-17 | Disposition: A | Discharge status: Home / Self Care | Payer: Medicare Other | Source: Ambulatory Visit | Attending: Radiology | Admitting: Radiology

## 2018-03-17 ENCOUNTER — Ambulatory Visit
Admission: RE | Admit: 2018-03-17 | Discharge: 2018-03-17 | Disposition: A | Discharge status: Home / Self Care | Payer: Medicare Other | Source: Ambulatory Visit | Attending: Internal Medicine | Admitting: Internal Medicine

## 2018-03-17 DIAGNOSIS — E041 Nontoxic single thyroid nodule: Secondary | ICD-10-CM

## 2018-04-15 ENCOUNTER — Encounter: Payer: Self-pay | Admitting: Podiatry

## 2018-04-15 ENCOUNTER — Ambulatory Visit (INDEPENDENT_AMBULATORY_CARE_PROVIDER_SITE_OTHER): Payer: Medicare Other | Admitting: Podiatry

## 2018-04-15 VITALS — BP 136/78 | HR 104

## 2018-04-15 DIAGNOSIS — M79675 Pain in left toe(s): Secondary | ICD-10-CM

## 2018-04-15 DIAGNOSIS — M79674 Pain in right toe(s): Secondary | ICD-10-CM

## 2018-04-15 DIAGNOSIS — B351 Tinea unguium: Secondary | ICD-10-CM | POA: Diagnosis not present

## 2018-04-15 DIAGNOSIS — E1142 Type 2 diabetes mellitus with diabetic polyneuropathy: Secondary | ICD-10-CM

## 2018-04-16 ENCOUNTER — Encounter: Payer: Self-pay | Admitting: Podiatry

## 2018-04-16 NOTE — [facility] Notes (Signed)
This patient presents to the office with chief complaint of long thick nails and diabetic feet.  This patient  says there  is  no pain and discomfort in her feet.  This patient says there are long thick painful nails.  These nails are painful walking and wearing shoes.  Patient has no history of infection or drainage from both feet.  Patient is unable to  self treat his own nails . This patient presents  to the office today for treatment of the  long nails and a foot evaluation due to history of  diabetes.  General Appearance  Alert, conversant and in no acute stress.  Vascular  Dorsalis pedis  are palpable  Bilaterally.  Posterior tibial pulses are absent  B/L secondary ankle swelling.  Capillary return is within normal limits  bilaterally. Temperature is within normal limits  bilaterally.  Neurologic  Senn-Weinstein monofilament wire test absent   bilaterally. Muscle power within normal limits bilaterally.  Nails Thick disfigured discolored nails with subungual debris  from hallux to fifth toes bilaterally. No evidence of bacterial infection or drainage bilaterally.  Orthopedic  No limitations of motion of motion feet .  No crepitus or effusions noted.  No bony pathology or digital deformities noted.  Skin  normotropic skin with no porokeratosis noted bilaterally.  No signs of infections or ulcers noted.     Onychomycosis  Diabetes with no foot complications  IE  Debride nails x 10.  A diabetic foot exam was performed and there is no evidence of any vascular .  Evidence of nerve  pathology.   RTC 3 months.   Helane GuntherGregory Senita Corredor DPM

## 2018-07-15 ENCOUNTER — Ambulatory Visit: Payer: Medicare Other | Admitting: Podiatry

## 2018-11-10 IMAGING — US US THYROID
1 series · 13 of 25 positions shown · non-contrast
Comparison: None 18 2146

CLINICAL DATA: Prior ultrasound follow-up. Follow-up thyroid nodule

EXAM:
THYROID ULTRASOUND
TECHNIQUE: Ultrasound examination of the thyroid gland and adjacent soft
tissues was performed.

[Series 1: us thyroid · 0.06mm/px · 46 acquisitions, 13 frames shown]
[im 1/46]
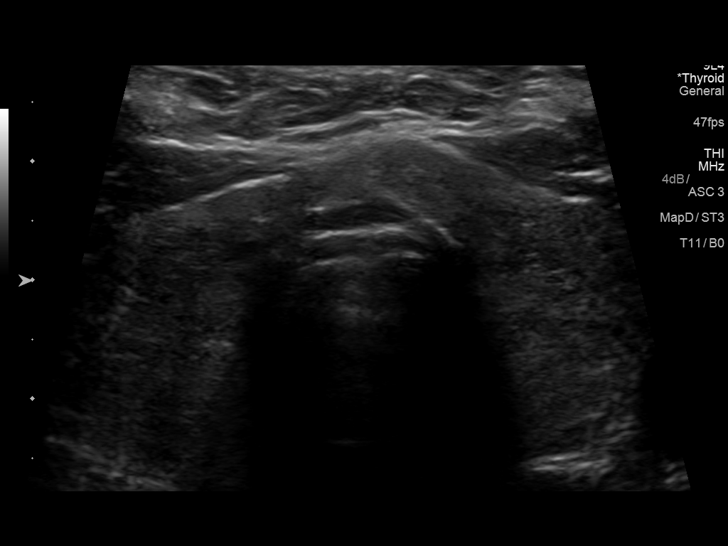
[im 4/46]
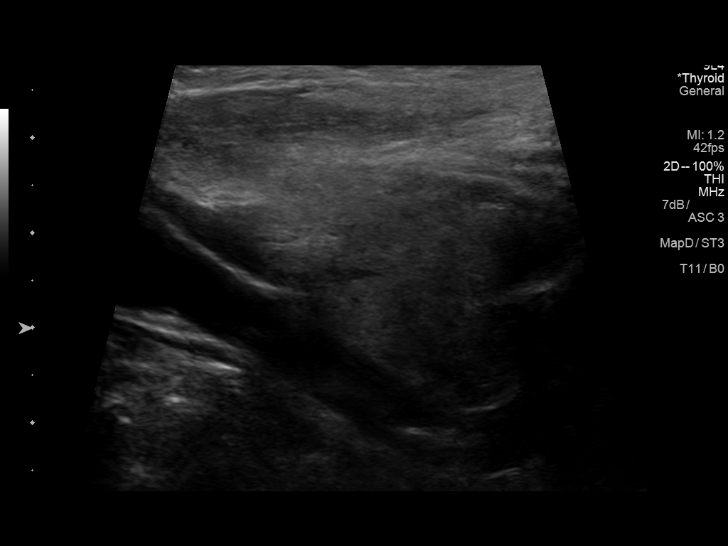
[im 8/46]
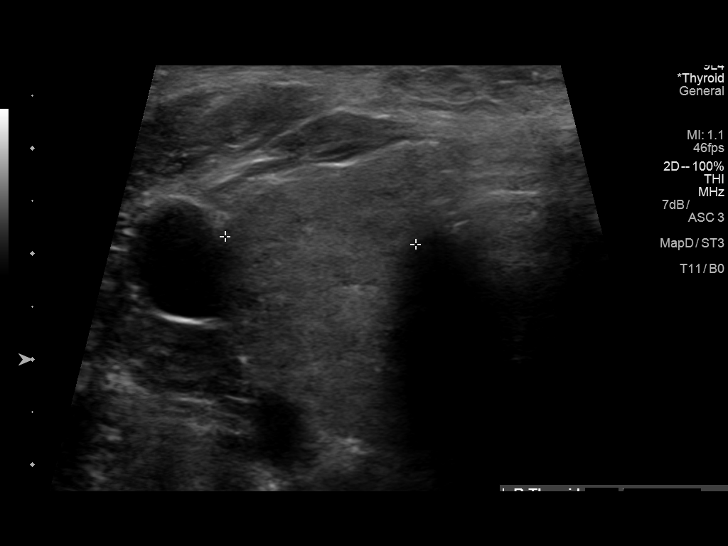
[im 12/46]
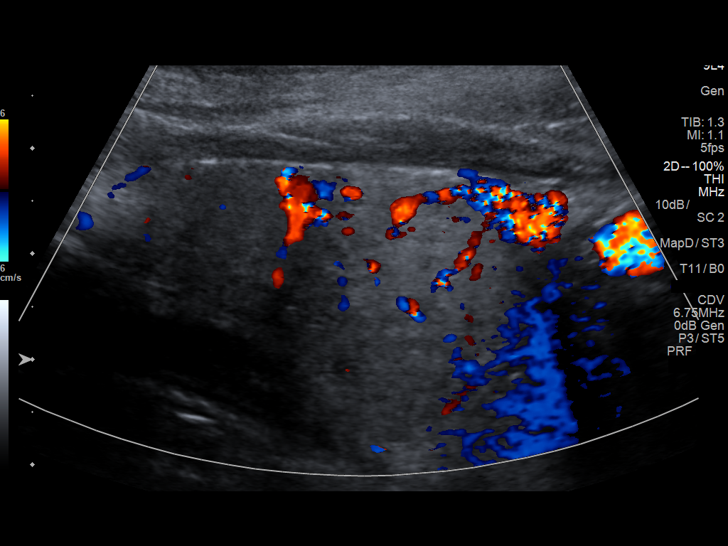
[im 16/46]
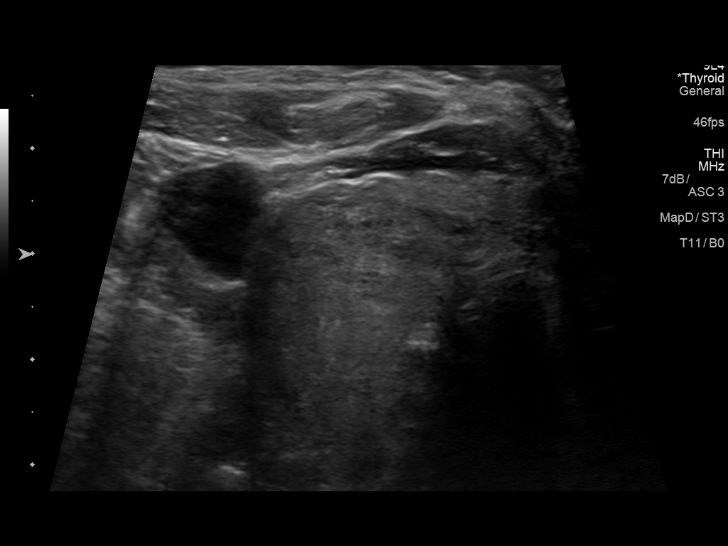
[im 19/46]
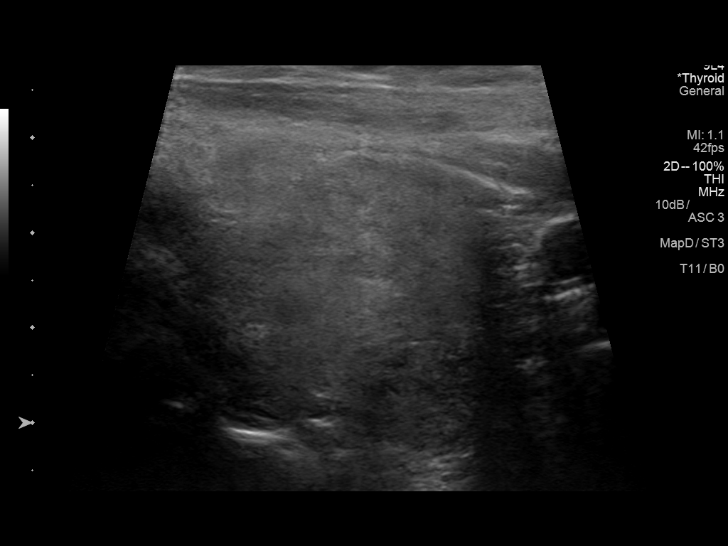
[im 23/46]
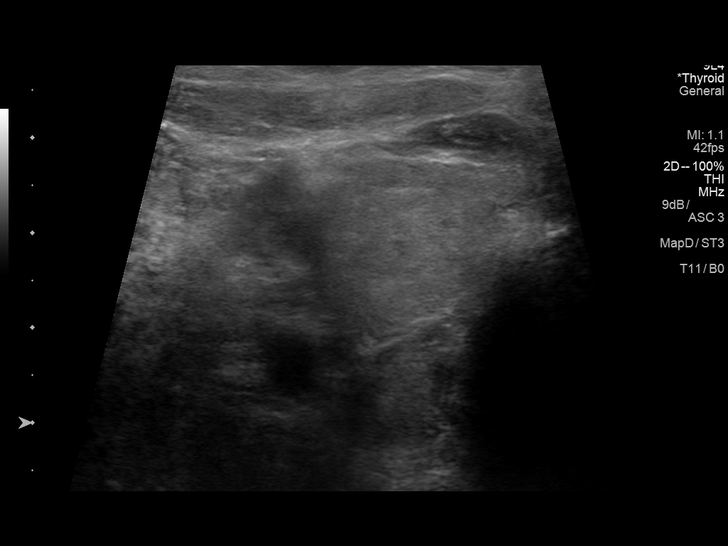
[im 27/46]
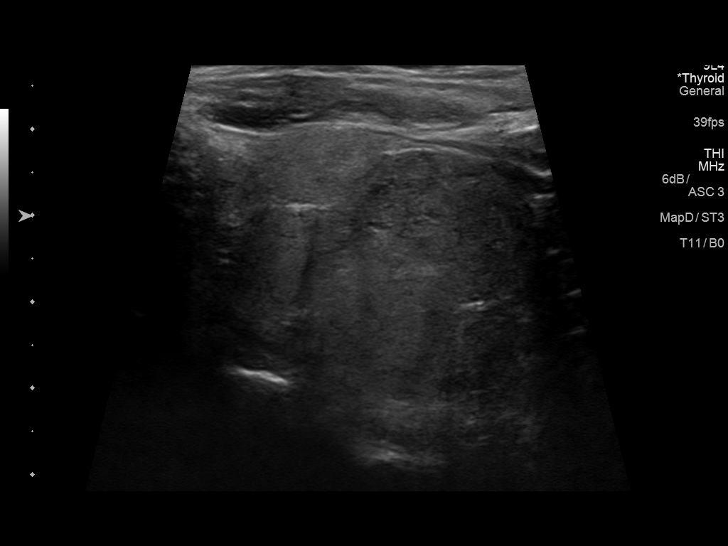
[im 31/46]
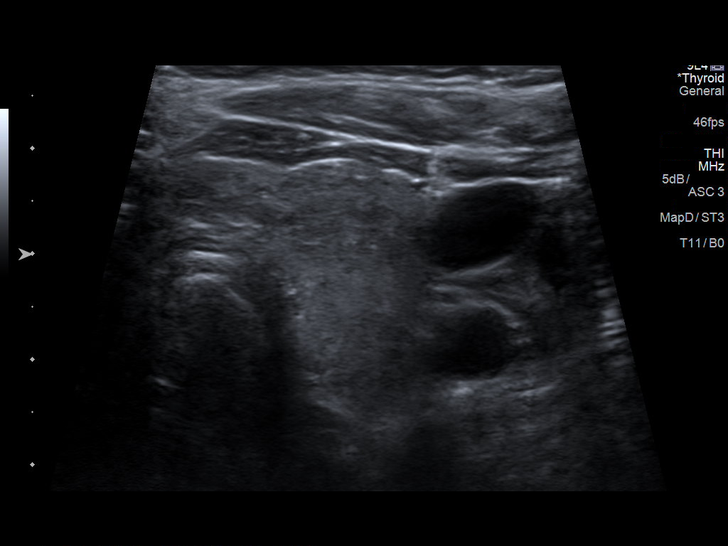
[im 34/46]
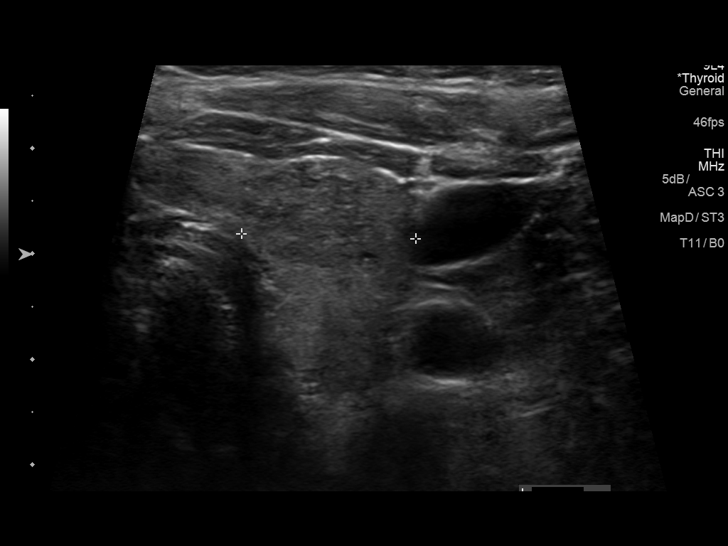
[im 38/46]
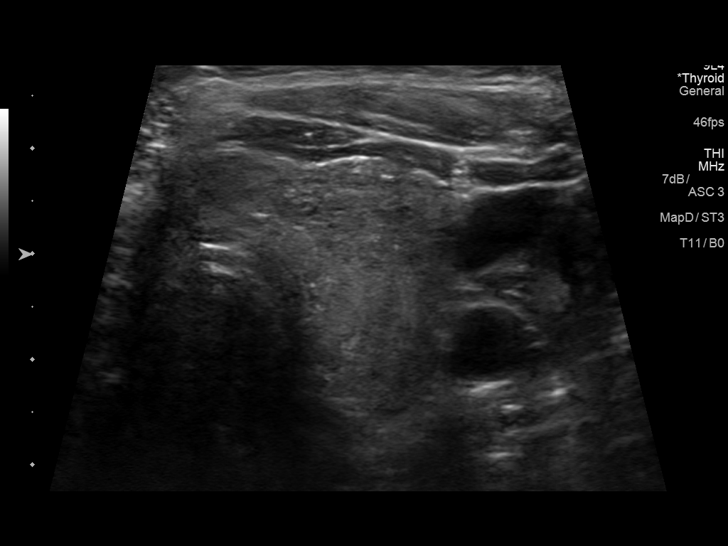
[im 42/46]
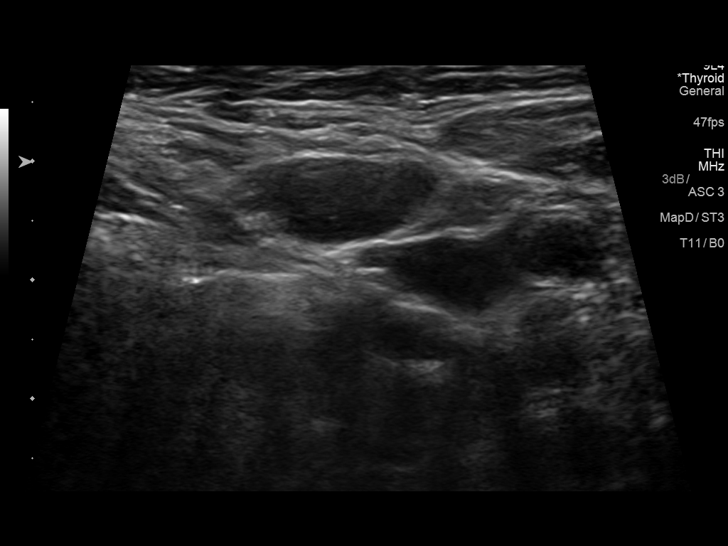
[im 46/46]
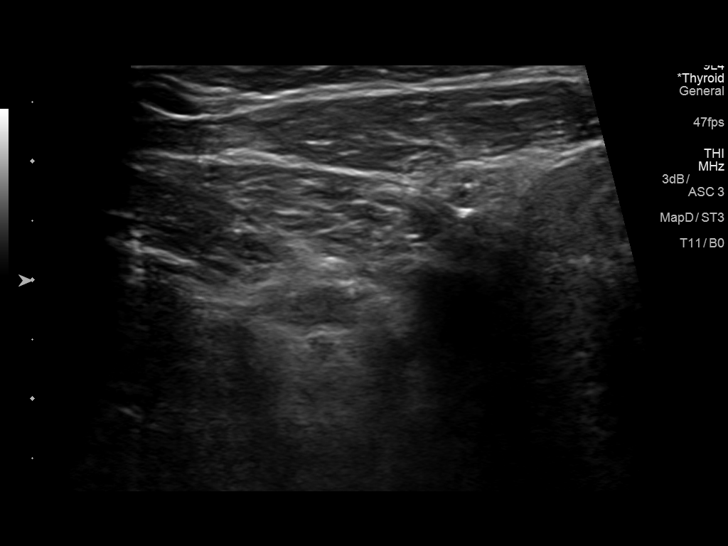

[13 of 25 positions shown; findings below may reference images not displayed]

FINDINGS: Parenchymal Echotexture: Moderately heterogenous

Isthmus: 0.5 cm, previously 0.4 cm

Right lobe: 4.7 x 2.9 x 9.8 cm previously 5.4 x 3.4 x 1.7 cm

Left lobe: 3.6 x 2.4 x 1.7 cm, previously 4.7 x 2.7 x 1.2 cm

_________________________________________________________

Estimated total number of nodules >/= 1 cm: 1

Number of spongiform nodules >/=  2 cm not described below (TR1): 0

Number of mixed cystic and solid nodules >/= 1.5 cm not described
below (TR2): 0

_________________________________________________________

Nodule versus pseudo nodule # 1:

Prior biopsy: No

Location: Right; Inferior

Maximum size: 2.4 cm; Other 2 dimensions: 2.3 x 1.9 cm, previously,
2.9 x 2.7 x 2.1 cm

Composition: solid/almost completely solid (2)

Echogenicity: isoechoic (1)

Shape: taller-than-wide (3)

Margins: smooth (0)

Echogenic foci: none (0)

ACR TI-RADS total points: 6.

ACR TI-RADS risk category:  TR4 (4-6 points).

Significant change in size (>/= 20% in two dimensions and minimal
increase of 2 mm): No

Change in features: No

Change in ACR TI-RADS risk category: No

ACR TI-RADS recommendations:

**Given size (>/= 1.5 cm) and appearance, fine needle aspiration of
this moderately suspicious nodule should be considered based on
TI-RADS criteria.

_________________________________________________________

There is heterogeneous tissue in the left lobe but no discrete
nodule. The previously described nodule was a pseudo nodule.
IMPRESSION: Right nodule versus pseudo nodule 1 is stable and continues to meet
criteria for fine needle aspiration biopsy.

The above is in keeping with the ACR TI-RADS recommendations - [HOSPITAL] 2146;[DATE].

## 2019-05-17 DIAGNOSIS — E11622 Type 2 diabetes mellitus with other skin ulcer: Secondary | ICD-10-CM | POA: Diagnosis not present

## 2019-05-17 DIAGNOSIS — E1165 Type 2 diabetes mellitus with hyperglycemia: Secondary | ICD-10-CM | POA: Diagnosis not present

## 2019-05-17 DIAGNOSIS — L97921 Non-pressure chronic ulcer of unspecified part of left lower leg limited to breakdown of skin: Secondary | ICD-10-CM | POA: Diagnosis not present

## 2019-05-17 DIAGNOSIS — I5032 Chronic diastolic (congestive) heart failure: Secondary | ICD-10-CM | POA: Diagnosis not present

## 2019-05-17 DIAGNOSIS — L97911 Non-pressure chronic ulcer of unspecified part of right lower leg limited to breakdown of skin: Secondary | ICD-10-CM | POA: Diagnosis not present

## 2019-06-26 DIAGNOSIS — L659 Nonscarring hair loss, unspecified: Secondary | ICD-10-CM | POA: Diagnosis not present

## 2019-06-26 DIAGNOSIS — Z008 Encounter for other general examination: Secondary | ICD-10-CM | POA: Diagnosis not present

## 2019-06-26 DIAGNOSIS — M79622 Pain in left upper arm: Secondary | ICD-10-CM | POA: Diagnosis not present

## 2019-06-26 DIAGNOSIS — H9193 Unspecified hearing loss, bilateral: Secondary | ICD-10-CM | POA: Diagnosis not present

## 2019-06-26 DIAGNOSIS — I130 Hypertensive heart and chronic kidney disease with heart failure and stage 1 through stage 4 chronic kidney disease, or unspecified chronic kidney disease: Secondary | ICD-10-CM | POA: Diagnosis not present

## 2019-07-04 ENCOUNTER — Other Ambulatory Visit: Payer: Self-pay | Admitting: Family

## 2019-07-04 ENCOUNTER — Ambulatory Visit
Admission: RE | Admit: 2019-07-04 | Discharge: 2019-07-04 | Disposition: A | Discharge status: Home / Self Care | Payer: Medicare Other | Source: Ambulatory Visit | Attending: Family | Admitting: Family

## 2019-07-04 DIAGNOSIS — M79622 Pain in left upper arm: Secondary | ICD-10-CM

## 2019-10-25 NOTE — Progress Notes (Signed)
Cardiology Office Note:   Date:  10/27/2019  NAME:  [name]    MRN: [id number] DOB:  12/08/1934   PCP:  Tsosie Billing, MD (Inactive)  Cardiologist:  No primary care provider on file.   Referring MD: Sonia Side., FNP   Chief Complaint  Patient presents with  . Leg Swelling   History of Present Illness:   [name] is a 85 y.o. female with a hx of DM, HTN who is being seen today for the evaluation of LE edema at the request of Sonia Side., FNP. She reports she is noticed increased leg swelling over the past 2 to 3 months. She was evaluated by her primary care physician and started on Lasix from what I suspect. She does not have that medication with her today. She is unclear of the name. She reports has been taking this with good urine output. Her leg edema has improved. She also reports that she has tried compression stockings but she has not been able to get ones that fit. She reports that she walks around her yard 3-4 times per day. She has no symptoms of chest pain or shortness of breath. She reports [place] rough neighborhood and is not comfortable walking on the street. She also was sent for evaluation of a murmur. She has a 2 out of 6 systolic ejection murmur. She was noted to have mild aortic stenosis in 2019. I suspect she has mild to moderate. She does not have the murmur of severe aortic stenosis. She does have evidence of venous insufficiency on exam today in the lower extremities. I suspect this is much of the etiology of her edema. She reports she is a never smoker. She reports no history of alcohol or drug abuse. Her main CVD risk factors include diabetes, hypertension and obesity. She does not have sleep apnea. Never used that she reports no family history of cardiovascular disease. 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 She also has LVH.  Problem List 1. HTN 2. DM -A1c 9.6 3. Mild aortic stenosis -2019  Past Medical  History: Past Medical History:  Diagnosis Date  . Diabetes mellitus without complication ([place])   . Emphysema of lung ([place])   . Hypertension   . Thyroid disease   . UTI (lower urinary tract infection)     Past Surgical History: Past Surgical History:  Procedure Laterality Date  . REPLACEMENT TOTAL KNEE Left     Current Medications: Current Meds  Medication Sig  . ACCU-CHEK AVIVA PLUS test strip USE AS DIRECTED TO TEST BLOOD SUGAR ONCE DAILY  . blood glucose meter kit and supplies Dispense based on patient and insurance preference. Use up to four times daily as directed. (FOR ICD-10 E10.9, E11.9).  Marland Kitchen lisinopril (PRINIVIL,ZESTRIL) 10 MG tablet   . [DISCONTINUED] metFORMIN (GLUCOPHAGE) 500 MG tablet Take 1 tablet (500 mg total) by mouth daily with breakfast.     Allergies:    Aspirin   Social History: Social History   Socioeconomic History  . Marital status: Widowed    Spouse name: Not on file  . Number of children: 5  . Years of education: Not on file  . Highest education level: Not on file  Occupational History  . Not on file  Tobacco Use  . Smoking status: Never Smoker  . Smokeless tobacco: Never Used  Substance and Sexual Activity  . Alcohol use: Never  . Drug use: Never  .  Sexual activity: Not on file  Other Topics Concern  . Not on file  Social History Narrative  . Not on file   Social Determinants of Health   Financial Resource Strain:   . Difficulty of Paying Living Expenses:   Food Insecurity:   . Worried About Charity fundraiser in the Last Year:   . Arboriculturist in the Last Year:   Transportation Needs:   . Film/video editor (Medical):   Marland Kitchen Lack of Transportation (Non-Medical):   Physical Activity:   . Days of Exercise per Week:   . Minutes of Exercise per Session:   Stress:   . Feeling of Stress :   Social Connections:   . Frequency of Communication with Friends and Family:   . Frequency of Social Gatherings with Friends and Family:    . Attends Religious Services:   . Active Member of Clubs or Organizations:   . Attends Archivist Meetings:   Marland Kitchen Marital Status:      Family History: The patient's family history includes Asthma in her brother and mother; COPD in her brother and sister; Cancer in her father.  ROS:   All other ROS reviewed and negative. Pertinent positives noted in the HPI.     EKGs/Labs/Other Studies Reviewed:   The following studies were personally reviewed by me today:  EKG:  EKG is ordered today.  The ekg ordered today demonstrates normal sinus rhythm, heart rate 65, inferior Q waves, LVH by voltage, and was personally reviewed by me.   TTE 02/2018 - Left ventricle: The cavity size was normal. Systolic function was  normal. The estimated ejection fraction was in the range of 60%  to 65%. Wall motion was normal; there were no regional wall  motion abnormalities. There was an increased relative  contribution of atrial contraction to ventricular filling.  Doppler parameters are consistent with abnormal left ventricular  relaxation (grade 1 diastolic dysfunction). Doppler parameters  are consistent with high ventricular filling pressure.  - Aortic valve: Valve mobility was restricted. There was mild  stenosis. Valve area (VTI): 1.5 cm^2. Valve area (Vmax): 1.48  cm^2. Valve area (Vmean): 1.57 cm^2.  - Mitral valve: Calcified annulus.  - Right ventricle: The cavity size was mildly dilated. Wall  thickness was normal.  - Tricuspid valve: There was trivial regurgitation.  - Pulmonary arteries: PA peak pressure: 34 mm Hg (S).   Recent Labs: No results found for requested labs within last 8760 hours.   Recent Lipid Panel    Component Value Date/Time   CHOL  03/15/2007 0340    168        ATP III CLASSIFICATION:  <200     mg/dL   Desirable  200-239  mg/dL   Borderline High  >=240    mg/dL   High   TRIG 292 (H) 03/15/2007 0340   HDL 28 (L) 03/15/2007 0340    CHOLHDL 6.0 03/15/2007 0340   VLDL 58 (H) 03/15/2007 0340   LDLCALC  03/15/2007 0340    82        Total Cholesterol/HDL:CHD Risk Coronary Heart Disease Risk Table                     Men   Women  1/2 Average Risk   3.4   3.3    Physical Exam:   VS:  BP (!) 137/67   Pulse 65   Ht '5\' 2"'  (1.575 m)   Wt 169  lb 9.6 oz (76.9 kg)   SpO2 96%   BMI 31.02 kg/m    Wt Readings from Last 3 Encounters:  10/27/19 169 lb 9.6 oz (76.9 kg)  12/29/17 169 lb (76.7 kg)  10/14/17 179 lb (81.2 kg)    General: Well nourished, well developed, in no acute distress Heart: Atraumatic, normal size  Eyes: PEERLA, EOMI  Neck: Supple, no JVD Endocrine: No thryomegaly Cardiac: Normal S1, S2; 3 out of 6 systolic ejection murmur radiates into the carotids, early peaking, suggestive of aortic stenosis Lungs: Clear to auscultation bilaterally, no wheezing, rhonchi or rales  Abd: Soft, nontender, no hepatomegaly  Ext: Trace lower extremity edema, venous insufficiency changes noted Musculoskeletal: No deformities, BUE and BLE strength normal and equal Skin: Warm and dry, no rashes   Neuro: Alert and oriented to person, place, time, and situation, CNII-XII grossly intact, no focal deficits  Psych: Normal mood and affect   ASSESSMENT:   [name] is a 85 y.o. female who presents for the following: 1. Leg edema   2. Venous insufficiency   3. Essential hypertension   4. Nonrheumatic aortic valve stenosis   5. Hyperlipidemia, unspecified hyperlipidemia type     PLAN:   1. Leg edema 2. Venous insufficiency -She has lower extremity edema. She has clear venous insufficiency. She has no shortness of breath or chest pain to suggest underlying worsening cardiovascular disease. She does have aortic stenosis based on my examination and her echocardiogram from 2019. We do need to repeat this. I would also like to check a BNP to exclude congestive heart failure. Her EKG demonstrates an old inferior infarct. She  has no history of heart attack. Her echocardiogram will help confirm what is going on. We also will check a full panel of labs that she is fasting including a lipid profile, complete metabolic panel, CBC. I do not have one in our system recently. I have encouraged her to continue what I suspect is Lasix and to bring the dose when she comes back in 3 months. She is noticed improvement with this. I also encouraged her to wear compression stockings. We will exclude any serious underlying cardiac pathology with the above work-up.  3. Essential hypertension -Well-controlled today. Continue lisinopril. She is on what I suspect is Lasix. Bring the dose and medication when she comes back.  4. Nonrheumatic aortic valve stenosis -Her murmur is suggestive of mild to moderate aortic stenosis. She had this on echocardiogram 2019. Her murmur is early peaking. This is not severe. We will recheck an echocardiogram.   5. Hyperlipidemia, unspecified hyperlipidemia type -She is not on a statin medication. I will check a lipid profile today. She will need to be on one because she is diabetic. I will follow up the results with her by phone.   Disposition: Return in about 3 months (around 01/27/2020).  Medication Adjustments/Labs and Tests Ordered: Current medicines are reviewed at length with the patient today.  Concerns regarding medicines are outlined above.  Orders Placed This Encounter  Procedures  . Brain natriuretic peptide  . Comprehensive metabolic panel  . CBC  . Lipid panel  . EKG 12-Lead  . ECHOCARDIOGRAM COMPLETE   No orders of the defined types were placed in this encounter.   Patient Instructions  Medication Instructions:  The current medical regimen is effective;  continue present plan and medications.  *If you need a refill on your cardiac medications before your next appointment, please call your pharmacy*   Lab Work:  LIPID, BNP, CMET, CBC today   If you have labs (blood work) drawn  today and your tests are completely normal, you will receive your results only by: Marland Kitchen MyChart Message (if you have MyChart) OR . A paper copy in the mail If you have any lab test that is abnormal or we need to change your treatment, we will call you to review the results.   Testing/Procedures: Echocardiogram - Your physician has requested that you have an echocardiogram. Echocardiography is a painless test that uses sound waves to create images of your heart. It provides your doctor with information about the size and shape of your heart and how well your heart's chambers and valves are working. This procedure takes approximately one hour. There are no restrictions for this procedure. This will be performed at our [facility] location - [address].     Follow-Up: At [facility], you and your health needs are our priority.  As part of our continuing mission to provide you with exceptional heart care, we have created designated Provider Care Teams.  These Care Teams include your primary Cardiologist (physician) and Advanced Practice Providers (APPs -  Physician Assistants and Nurse Practitioners) who all work together to provide you with the care you need, when you need it.  We recommend signing up for the patient portal called "MyChart".  Sign up information is provided on this After Visit Summary.  MyChart is used to connect with patients for Virtual Visits (Telemedicine).  Patients are able to view lab/test results, encounter notes, upcoming appointments, etc.  Non-urgent messages can be sent to your provider as well.   To learn more about what you can do with MyChart, go to [email or web address].    Your next appointment:   3 month(s)  The format for your next appointment:   In Person  Provider:   Eleonore Chiquito, MD   Other Instructions   Bring all medications to next follow up visit.    How to Use Compression Stockings Compression stockings are elastic socks  that squeeze the legs. They help increase blood flow (circulation) to the legs, decrease swelling in the legs, and reduce the chance of developing blood clots in the lower legs. Compression stockings are often used by people who:  Are recovering from surgery.  Have poor circulation in their legs.  Tend to get blood clots in their legs.  Have bulging (varicose) veins.  Sit or stay in bed for long periods of time. Follow instructions from your health care provider about how and when to wear your compression stockings. How to wear compression stockings Before you put on your compression stockings:  Make sure that they are the correct size and degree of compression. If you do not know your size or required grade of compression, ask your health care provider and follow the manufacturer's instructions that come with the stockings.  Make sure that they are clean, dry, and in good condition.  Check them for rips and tears. Do not put them on if they are ripped or torn. Put your stockings on first thing in the morning, before you get out of bed. Keep them on for as long as your health care provider advises. When you are wearing your stockings:  Keep them as smooth as possible. Do not allow them to bunch up. It is especially important to prevent the stockings from bunching up around your toes or behind your knees.  Do not roll the stockings downward and leave  them rolled down. This can decrease blood flow to your leg.  Change them right away if they become wet or dirty. When you take off your stockings, inspect your legs and feet. Check for:  Open sores.  Red spots.  Swelling. General tips  Do not stop wearing compression stockings without talking to your health care provider first.  Wash your stockings every day with mild detergent in cold or warm water. Do not use bleach. Air-dry your stockings or dry them in a clothes dryer on low heat. It may be helpful to have two pairs so that you  have a pair to wear while the other is being washed.  Replace your stockings every 3-6 months.  If skin moisturizing is part of your treatment plan, apply lotion or cream at night so that your skin will be dry when you put on the stockings in the morning. It is harder to put the stockings on when you have lotion on your legs or feet.  Wear nonskid shoes or slip-resistant socks when walking while wearing compression stockings. Contact a health care provider and remove your stockings if you have:  A feeling of pins and needles in your feet or legs.  Open sores, red spots, or other skin changes on your feet or legs.  Swelling or pain that gets worse. Get help right away if you have:  Numbness or tingling in your lower legs that does not get better right after you take the stockings off.  Toes or feet that are unusually cold or turn a bluish color.  A warm or red area on your leg.  New swelling or soreness in your leg.  Shortness of breath.  Chest pain.  A fast or irregular heartbeat.  Light-headedness.  Dizziness. Summary  Compression stockings are elastic socks that squeeze the legs.  They help increase blood flow (circulation) to the legs, decrease swelling in the legs, and reduce the chance of developing blood clots in the lower legs.  Follow instructions from your health care provider about how and when to wear your compression stockings.  Do not stop wearing your compression stockings without talking to your health care provider first. This information is not intended to replace advice given to you by your health care provider. Make sure you discuss any questions you have with your health care provider. Document Revised: 03/18/2017 Document Reviewed: 03/18/2017 Elsevier Patient Education  2020 [place], [place]. Audie Box, [place]  [address] [place], [place] [address] [phone]  10/27/2019 10:11 AM

## 2019-10-27 ENCOUNTER — Other Ambulatory Visit: Payer: Self-pay

## 2019-10-27 ENCOUNTER — Encounter: Payer: Self-pay | Admitting: Cardiovascular Disease

## 2019-10-27 ENCOUNTER — Ambulatory Visit: Payer: Medicare Other | Admitting: Cardiovascular Disease

## 2019-10-27 VITALS — BP 137/67 | HR 65 | Ht 62.0 in | Wt 169.6 lb

## 2019-10-27 DIAGNOSIS — I350 Nonrheumatic aortic (valve) stenosis: Secondary | ICD-10-CM

## 2019-10-27 DIAGNOSIS — I872 Venous insufficiency (chronic) (peripheral): Secondary | ICD-10-CM

## 2019-10-27 DIAGNOSIS — I10 Essential (primary) hypertension: Secondary | ICD-10-CM | POA: Diagnosis not present

## 2019-10-27 DIAGNOSIS — R600 Localized edema: Secondary | ICD-10-CM

## 2019-10-27 DIAGNOSIS — E785 Hyperlipidemia, unspecified: Secondary | ICD-10-CM

## 2019-10-27 NOTE — Patient Instructions (Addendum)
Medication Instructions:  The current medical regimen is effective;  continue present plan and medications.  *If you need a refill on your cardiac medications before your next appointment, please call your pharmacy*   Lab Work: LIPID, BNP, CMET, CBC today   If you have labs (blood work) drawn today and your tests are completely normal, you will receive your results only by: Marland Kitchen MyChart Message (if you have MyChart) OR . A paper copy in the mail If you have any lab test that is abnormal or we need to change your treatment, we will call you to review the results.   Testing/Procedures: Echocardiogram - Your physician has requested that you have an echocardiogram. Echocardiography is a painless test that uses sound waves to create images of your heart. It provides your doctor with information about the size and shape of your heart and how well your heart's chambers and valves are working. This procedure takes approximately one hour. There are no restrictions for this procedure. This will be performed at our [facility] location - [address], [address].     Follow-Up: At [facility], you and your health needs are our priority.  As part of our continuing mission to provide you with exceptional heart care, we have created designated Provider Care Teams.  These Care Teams include your primary Cardiologist (physician) and Advanced Practice Providers (APPs -  Physician Assistants and Nurse Practitioners) who all work together to provide you with the care you need, when you need it.  We recommend signing up for the patient portal called "MyChart".  Sign up information is provided on this After Visit Summary.  MyChart is used to connect with patients for Virtual Visits (Telemedicine).  Patients are able to view lab/test results, encounter notes, upcoming appointments, etc.  Non-urgent messages can be sent to your provider as well.   To learn more about what you can do with MyChart, go to  [email or web address].    Your next appointment:   3 month(s)  The format for your next appointment:   In Person  Provider:   Lennie Odor, MD   Other Instructions   Bring all medications to next follow up visit.    How to Use Compression Stockings Compression stockings are elastic socks that squeeze the legs. They help increase blood flow (circulation) to the legs, decrease swelling in the legs, and reduce the chance of developing blood clots in the lower legs. Compression stockings are often used by people who:  Are recovering from surgery.  Have poor circulation in their legs.  Tend to get blood clots in their legs.  Have bulging (varicose) veins.  Sit or stay in bed for long periods of time. Follow instructions from your health care provider about how and when to wear your compression stockings. How to wear compression stockings Before you put on your compression stockings:  Make sure that they are the correct size and degree of compression. If you do not know your size or required grade of compression, ask your health care provider and follow the manufacturer's instructions that come with the stockings.  Make sure that they are clean, dry, and in good condition.  Check them for rips and tears. Do not put them on if they are ripped or torn. Put your stockings on first thing in the morning, before you get out of bed. Keep them on for as long as your health care provider advises. When you are wearing your stockings:  Keep them as smooth  as possible. Do not allow them to bunch up. It is especially important to prevent the stockings from bunching up around your toes or behind your knees.  Do not roll the stockings downward and leave them rolled down. This can decrease blood flow to your leg.  Change them right away if they become wet or dirty. When you take off your stockings, inspect your legs and feet. Check for:  Open sores.  Red spots.  Swelling. General  tips  Do not stop wearing compression stockings without talking to your health care provider first.  Wash your stockings every day with mild detergent in cold or warm water. Do not use bleach. Air-dry your stockings or dry them in a clothes dryer on low heat. It may be helpful to have two pairs so that you have a pair to wear while the other is being washed.  Replace your stockings every 3-6 months.  If skin moisturizing is part of your treatment plan, apply lotion or cream at night so that your skin will be dry when you put on the stockings in the morning. It is harder to put the stockings on when you have lotion on your legs or feet.  Wear nonskid shoes or slip-resistant socks when walking while wearing compression stockings. Contact a health care provider and remove your stockings if you have:  A feeling of pins and needles in your feet or legs.  Open sores, red spots, or other skin changes on your feet or legs.  Swelling or pain that gets worse. Get help right away if you have:  Numbness or tingling in your lower legs that does not get better right after you take the stockings off.  Toes or feet that are unusually cold or turn a bluish color.  A warm or red area on your leg.  New swelling or soreness in your leg.  Shortness of breath.  Chest pain.  A fast or irregular heartbeat.  Light-headedness.  Dizziness. Summary  Compression stockings are elastic socks that squeeze the legs.  They help increase blood flow (circulation) to the legs, decrease swelling in the legs, and reduce the chance of developing blood clots in the lower legs.  Follow instructions from your health care provider about how and when to wear your compression stockings.  Do not stop wearing your compression stockings without talking to your health care provider first. This information is not intended to replace advice given to you by your health care provider. Make sure you discuss any questions you  have with your health care provider. Document Revised: 03/18/2017 Document Reviewed: 03/18/2017 Elsevier Patient Education  2020 [employer].

## 2019-10-28 LAB — LIPID PANEL
Chol/HDL Ratio: 5.2 ratio — ABNORMAL HIGH (ref 0.0–4.4)
Cholesterol, Total: 198 mg/dL (ref 100–199)
HDL: 38 mg/dL — ABNORMAL LOW (ref >39)
LDL Chol Calc (NIH): 136 mg/dL — ABNORMAL HIGH (ref 0–99)
Triglycerides: 133 mg/dL (ref 0–149)
VLDL Cholesterol Cal: 24 mg/dL (ref 5–40)

## 2019-10-28 LAB — COMPREHENSIVE METABOLIC PANEL
ALT: 14 IU/L (ref 0–32)
AST: 15 IU/L (ref 0–40)
Albumin/Globulin Ratio: 1.6 (ref 1.2–2.2)
Albumin: 4.5 g/dL (ref 3.6–4.6)
Alkaline Phosphatase: 51 IU/L (ref 48–121)
BUN/Creatinine Ratio: 13 (ref 12–28)
BUN: 9 mg/dL (ref 8–27)
Bilirubin Total: 0.4 mg/dL (ref 0.0–1.2)
CO2: 25 mmol/L (ref 20–29)
Calcium: 8.2 mg/dL — ABNORMAL LOW (ref 8.7–10.3)
Chloride: 100 mmol/L (ref 96–106)
Creatinine, Ser: 0.7 mg/dL (ref 0.57–1.00)
GFR calc Af Amer: 93 mL/min/{1.73_m2} (ref >59)
GFR calc non Af Amer: 80 mL/min/{1.73_m2} (ref >59)
Globulin, Total: 2.8 g/dL (ref 1.5–4.5)
Glucose: 107 mg/dL — ABNORMAL HIGH (ref 65–99)
Potassium: 5 mmol/L (ref 3.5–5.2)
Sodium: 140 mmol/L (ref 134–144)
Total Protein: 7.3 g/dL (ref 6.0–8.5)

## 2019-10-28 LAB — CBC
Hematocrit: 38.6 % (ref 34.0–46.6)
Hemoglobin: 13.5 g/dL (ref 11.1–15.9)
MCH: 31.3 pg (ref 26.6–33.0)
MCHC: 35 g/dL (ref 31.5–35.7)
MCV: 90 fL (ref 79–97)
Platelets: 199 10*3/uL (ref 150–450)
RBC: 4.31 x10E6/uL (ref 3.77–5.28)
RDW: 12.6 % (ref 11.7–15.4)
WBC: 5.6 10*3/uL (ref 3.4–10.8)

## 2019-10-28 LAB — BRAIN NATRIURETIC PEPTIDE: BNP: 80.8 pg/mL (ref 0.0–100.0)

## 2019-11-08 ENCOUNTER — Telehealth: Payer: Self-pay | Admitting: Cardiovascular Disease

## 2019-11-08 NOTE — Telephone Encounter (Signed)
Attempted to contact patient, unable to leave message.

## 2019-11-08 NOTE — Telephone Encounter (Signed)
Patient is returning call to discuss results from lab work completed on 10/27/19.  

## 2019-11-08 NOTE — Telephone Encounter (Signed)
Sent to primary nurse 

## 2019-11-15 ENCOUNTER — Ambulatory Visit (HOSPITAL_COMMUNITY): Payer: Medicare Other | Attending: Cardiovascular Disease

## 2019-11-15 ENCOUNTER — Other Ambulatory Visit: Payer: Self-pay

## 2019-11-15 DIAGNOSIS — I350 Nonrheumatic aortic (valve) stenosis: Secondary | ICD-10-CM

## 2019-11-15 LAB — ECHOCARDIOGRAM COMPLETE
AR max vel: 1.03 cm2
AV Area VTI: 1 cm2
AV Area mean vel: 0.96 cm2
AV Mean grad: 21 mmHg
AV Peak grad: 37 mmHg
Ao pk vel: 3.04 m/s
Area-P 1/2: 2.8 cm2
S' Lateral: 2.4 cm

## 2019-11-22 ENCOUNTER — Telehealth: Payer: Self-pay | Admitting: Cardiovascular Disease

## 2019-11-22 NOTE — Telephone Encounter (Signed)
Patient returned call for her Echo results.  

## 2019-11-22 NOTE — Telephone Encounter (Signed)
Spoke to patient echo results given.Advised to repeat in 2 to 3 years.

## 2020-01-29 ENCOUNTER — Ambulatory Visit: Payer: Medicare Other | Admitting: Cardiovascular Disease

## 2020-08-29 ENCOUNTER — Encounter (HOSPITAL_BASED_OUTPATIENT_CLINIC_OR_DEPARTMENT_OTHER): Payer: Medicare (Managed Care) | Attending: Internal Medicine | Admitting: Internal Medicine

## 2020-08-29 ENCOUNTER — Other Ambulatory Visit: Payer: Self-pay

## 2020-08-29 DIAGNOSIS — Z833 Family history of diabetes mellitus: Secondary | ICD-10-CM | POA: Diagnosis not present

## 2020-08-29 DIAGNOSIS — I87323 Chronic venous hypertension (idiopathic) with inflammation of bilateral lower extremity: Secondary | ICD-10-CM

## 2020-08-29 DIAGNOSIS — E119 Type 2 diabetes mellitus without complications: Secondary | ICD-10-CM | POA: Diagnosis not present

## 2020-08-29 DIAGNOSIS — E11622 Type 2 diabetes mellitus with other skin ulcer: Secondary | ICD-10-CM | POA: Insufficient documentation

## 2020-08-29 DIAGNOSIS — I110 Hypertensive heart disease with heart failure: Secondary | ICD-10-CM | POA: Insufficient documentation

## 2020-08-29 DIAGNOSIS — L97829 Non-pressure chronic ulcer of other part of left lower leg with unspecified severity: Secondary | ICD-10-CM | POA: Diagnosis not present

## 2020-08-29 DIAGNOSIS — I5032 Chronic diastolic (congestive) heart failure: Secondary | ICD-10-CM | POA: Insufficient documentation

## 2020-08-29 NOTE — Progress Notes (Signed)
[name], [name] ([id number]) Visit Report for 08/29/2020 Abuse/Suicide Risk Screen Details Patient Name: Date of Service: [name], [name] 08/29/2020 1:15 PM Medical Record Number: [id number] Patient Account Number: [id number] Date of Birth/Sex: Treating RN: [birth year] (86 y.o. Female) Antonieta Iba Primary Care Dorse Locy: Lonni Fix, DEREK Other Clinician: Referring Adlai Nieblas: Treating Azaylia Fong/Extender: Tilda Franco in Treatment: 0 Abuse/Suicide Risk Screen Items Answer ABUSE RISK SCREEN: Has anyone close to you tried to hurt or harm you recentlyo No Do you feel uncomfortable with anyone in your familyo No Has anyone forced you do things that you didnt want to doo No Electronic Signature(s) Signed: 08/29/2020 5:54:05 PM By: Antonieta Iba Entered By: Antonieta Iba on 08/29/2020 13:09:39 -------------------------------------------------------------------------------- Activities of Daily Living Details Patient Name: Date of Service: [name], [name] 08/29/2020 1:15 PM Medical Record Number: [id number] Patient Account Number: [id number] Date of Birth/Sex: Treating RN: [name] (86 y.o. Female) Antonieta Iba Primary Care Faisal Stradling: Lonni Fix, DEREK Other Clinician: Referring Jayceion Lisenby: Treating Marlyss Cissell/Extender: Tilda Franco in Treatment: 0 Activities of Daily Living Items Answer Activities of Daily Living (Please select one for each item) Drive Automobile Need Assistance T Medications ake Completely Able Use T elephone Completely Able Care for Appearance Completely Able Use T oilet Completely Able Bath / Shower Completely Able Dress Self Completely Able Feed Self Completely Able Walk Completely Able Get In / Out Bed Completely Able Housework Completely Able Prepare Meals Completely Able Handle Money Completely Able Shop for Self Completely Able Electronic Signature(s) Signed: 08/29/2020 5:54:05 PM By: Antonieta Iba Entered By: Antonieta Iba on 08/29/2020  13:10:16 -------------------------------------------------------------------------------- Education Screening Details Patient Name: Date of Service: Maria Belt. 08/29/2020 1:15 PM Medical Record Number: [id number] Patient Account Number: [id number] Date of Birth/Sex: Treating RN: [birth year] (86 y.o. Female) Antonieta Iba Primary Care Rosann Gorum: Lonni Fix, DEREK Other Clinician: Referring Ashleyanne Hemmingway: Treating Latrail Pounders/Extender: Tilda Franco in Treatment: 0 Primary Learner Assessed: Patient Learning Preferences/Education Level/Primary Language Learning Preference: Explanation, Demonstration, Printed Material Highest Education Level: High School Preferred Language: English Cognitive Barrier Language Barrier: No Translator Needed: No Memory Deficit: No Emotional Barrier: No Cultural/Religious Beliefs Affecting Medical Care: No Physical Barrier Impaired Vision: Yes Glasses Impaired Hearing: Yes Decreased Hand dexterity: No Knowledge/Comprehension Knowledge Level: High Comprehension Level: High Ability to understand written instructions: High Ability to understand verbal instructions: High Motivation Anxiety Level: Calm Cooperation: Cooperative Education Importance: Acknowledges Need Interest in Health Problems: Asks Questions Perception: Coherent Willingness to Engage in Self-Management High Activities: Readiness to Engage in Self-Management High Activities: Electronic Signature(s) Signed: 08/29/2020 5:54:05 PM By: Antonieta Iba Entered By: Antonieta Iba on 08/29/2020 13:11:00 -------------------------------------------------------------------------------- Fall Risk Assessment Details Patient Name: Date of Service: [name], [name] 08/29/2020 1:15 PM Medical Record Number: [id number] Patient Account Number: [id number] Date of Birth/Sex: Treating RN: Fowler-[birth year]-[name] (86 y.o. Female) Antonieta Iba Primary Care Kiffany Schelling: Lonni Fix, DEREK Other  Clinician: Referring Azariah Bonura: Treating Ival Basquez/Extender: Tilda Franco in Treatment: 0 Fall Risk Assessment Items Have you had 2 or more falls in the last 12 monthso 0 No Have you had any fall that resulted in injury in the last 12 monthso 0 No FALLS RISK SCREEN History of falling - immediate or within 3 months 0 No Secondary diagnosis (Do you have 2 or more medical diagnoseso) 0 No Ambulatory aid None/bed rest/wheelchair/nurse 0 Yes Crutches/cane/walker 0 No Furniture 0 No Intravenous therapy Access/Saline/Heparin Lock 0 No Gait/Transferring Normal/ bed rest/ wheelchair 0 Yes Weak (short steps with or without shuffle, stooped but  able to lift head while walking, may seek 0 No support from furniture) Impaired (short steps with shuffle, may have difficulty arising from chair, head down, impaired 0 No balance) Mental Status Oriented to own ability 0 Yes Electronic Signature(s) Signed: 08/29/2020 5:54:05 PM By: Antonieta Iba Entered By: Antonieta Iba on 08/29/2020 13:11:25 -------------------------------------------------------------------------------- Foot Assessment Details Patient Name: Date of Service: Maria Belt. 08/29/2020 1:15 PM Medical Record Number: [id number] Patient Account Number: [id number] Date of Birth/Sex: Treating RN: [name], [name] (86 y.o. Female) Antonieta Iba Primary Care Gari Trovato: Lonni Fix, DEREK Other Clinician: Referring Rayshawn Maney: Treating Novali Vollman/Extender: Tilda Franco in Treatment: 0 Foot Assessment Items Site Locations + = Sensation present, - = Sensation absent, C = Callus, U = Ulcer R = Redness, W = Warmth, M = Maceration, PU = Pre-ulcerative lesion F = Fissure, S = Swelling, D = Dryness Assessment Right: Left: Other Deformity: No No Prior Foot Ulcer: No No Prior Amputation: No No Charcot Joint: No No Ambulatory Status: Ambulatory Without Help Gait: Steady Electronic Signature(s) Signed: 08/29/2020 5:54:05 PM By:  Antonieta Iba Entered By: Antonieta Iba on 08/29/2020 13:23:37 -------------------------------------------------------------------------------- Nutrition Risk Screening Details Patient Name: Date of Service: [name], [name] 08/29/2020 1:15 PM Medical Record Number: [id number] Patient Account Number: [id number] Date of Birth/Sex: Treating RN: [name] (86 y.o. Female) Antonieta Iba Primary Care Aras Albarran: Lonni Fix, DEREK Other Clinician: Referring Alexei Ey: Treating Lamarkus Nebel/Extender: Tilda Franco in Treatment: 0 Height (in): 62 Weight (lbs): 174 Body Mass Index (BMI): 31.8 Nutrition Risk Screening Items Score Screening NUTRITION RISK SCREEN: I have an illness or condition that made me change the kind and/or amount of food I eat 0 No I eat fewer than two meals per day 3 Yes I eat few fruits and vegetables, or milk products 0 No I have three or more drinks of beer, liquor or wine almost every day 0 No I have tooth or mouth problems that make it hard for me to eat 0 No I don't always have enough money to buy the food I need 0 No I eat alone most of the time 0 No I take three or more different prescribed or over-the-counter drugs a day 1 Yes Without wanting to, I have lost or gained 10 pounds in the last six months 0 No I am not always physically able to shop, cook and/or feed myself 0 No Nutrition Protocols Good Risk Protocol Moderate Risk Protocol 0 Provide education on nutrition High Risk Proctocol Risk Level: Moderate Risk Score: 4 Electronic Signature(s) Signed: 08/29/2020 5:54:05 PM By: Antonieta Iba Entered By: Antonieta Iba on 08/29/2020 13:11:55

## 2020-09-04 NOTE — Progress Notes (Signed)
[name], [name] ([id number]) Visit Report for 08/29/2020 Chief Complaint Document Details Patient Name: Date of Service: [name], [name] 08/29/2020 1:15 PM Medical Record Number: [id number] Patient Account Number: [id number] Date of Birth/Sex: Treating RN: 1935/03/12 (86 y.o. Female) Shawn Stall Primary Care Provider: Fatima Sanger Other Clinician: Referring Provider: Treating Provider/Extender: Ebony Cargo in Treatment: 0 Information Obtained from: Patient Chief Complaint Bilateral lower extremity swelling with open wounds to the left lower extremity. Electronic Signature(s) Signed: 08/29/2020 2:24:20 PM By: Geralyn Corwin DO Entered By: Geralyn Corwin on 08/29/2020 14:13:25 -------------------------------------------------------------------------------- Debridement Details Patient Name: Date of Service: [name]. 08/29/2020 1:15 PM Medical Record Number: [id number] Patient Account Number: [id number] Date of Birth/Sex: Treating RN: 1935-03-24 (86 y.o. Female) Zandra Abts Primary Care Provider: Fatima Sanger Other Clinician: Referring Provider: Treating Provider/Extender: Ebony Cargo in Treatment: 0 Debridement Performed for Assessment: Wound #1 Left,Posterior Lower Leg Performed By: Clinician Zandra Abts, RN Debridement Type: Chemical/Enzymatic/Mechanical Agent Used: Anasept and gauze to remove biofilm Severity of Tissue Pre Debridement: Fat layer exposed Level of Consciousness (Pre-procedure): Awake and Alert Pre-procedure Verification/Time Out No Taken: Start Time: 14:00 Bleeding: None Procedural Pain: 0 Post Procedural Pain: 0 Response to Treatment: Procedure was tolerated well Level of Consciousness (Post- Awake and Alert procedure): Post Debridement Measurements of Total Wound Length: (cm) 3 Width: (cm) 3 Depth: (cm) 0.1 Volume: (cm) 0.707 Character of Wound/Ulcer Post Debridement: Improved Severity of Tissue  Post Debridement: Fat layer exposed Post Procedure Diagnosis Same as Pre-procedure Electronic Signature(s) Signed: 08/29/2020 2:24:20 PM By: Geralyn Corwin DO Signed: 09/04/2020 5:57:05 PM By: Zandra Abts RN, BSN Entered By: Zandra Abts on 08/29/2020 14:07:50 -------------------------------------------------------------------------------- HPI Details Patient Name: Date of Service: [name]. 08/29/2020 1:15 PM Medical Record Number: [id number] Patient Account Number: [id number] Date of Birth/Sex: Treating RN: 1935-01-21 (86 y.o. Female) Shawn Stall Primary Care Provider: Fatima Sanger Other Clinician: Referring Provider: Treating Provider/Extender: Ebony Cargo in Treatment: 0 History of Present Illness HPI Description: Admission 6/2 [name] is an 86 year old female with a past medical history of type 2 diabetes on oral agents, chronic diastolic heart failure and venous stasis insufficiency that presents to the clinic for bilateral lower extremity swelling and open wounds to the left lower extremity. She states that 2 months ago she was on her feet for long periods of time walking since she did not have a car. Her legs became more swollen and developed wounds to her legs bilaterally. She states that over time her right lower extremity wounds healed however she continues to have some wounds to her left leg although much improved. She is wearing some mild compression to the right leg. She has been having her left leg wrapped by her primary care physician's office 3 times a week with Kerlix/Coban. She states she finished her antibiotics that were prescribed there as well for cellulitis. She currently denies signs of infection. Electronic Signature(s) Signed: 08/29/2020 2:24:20 PM By: Geralyn Corwin DO Entered By: Geralyn Corwin on 08/29/2020 14:19:24 -------------------------------------------------------------------------------- Physical Exam  Details Patient Name: Date of Service: [name]. 08/29/2020 1:15 PM Medical Record Number: [id number] Patient Account Number: [id number] Date of Birth/Sex: Treating RN: 1934-06-24 (86 y.o. Female) Shawn Stall Primary Care Provider: Fatima Sanger Other Clinician: Referring Provider: Treating Provider/Extender: Ebony Cargo in Treatment: 0 Constitutional respirations regular, non-labored and within target range for patient.. Cardiovascular 2+ dorsalis pedis/posterior tibialis pulses. Psychiatric pleasant and cooperative. Notes Venous stasis  dermatitis bilaterally 2+ pitting edema to the knees Left lower extremity: Small wounds limited to skin breakdown. No acute signs of infection. Electronic Signature(s) Signed: 08/29/2020 2:24:20 PM By: Geralyn Corwin DO Entered By: Geralyn Corwin on 08/29/2020 14:20:26 -------------------------------------------------------------------------------- Physician Orders Details Patient Name: Date of Service: [name]. 08/29/2020 1:15 PM Medical Record Number: [id number] Patient Account Number: [id number] Date of Birth/Sex: Treating RN: 12/07/1934 (86 y.o. Female) Zandra Abts Primary Care Provider: Fatima Sanger Other Clinician: Referring Provider: Treating Provider/Extender: Ebony Cargo in Treatment: 0 Verbal / Phone Orders: No Diagnosis Coding ICD-10 Coding Code Description [phone] Chronic venous hypertension (idiopathic) with inflammation of bilateral lower extremity L97.829 Non-pressure chronic ulcer of other part of left lower leg with unspecified severity E11.9 Type 2 diabetes mellitus without complications I50.32 Chronic diastolic (congestive) heart failure Follow-up Appointments ppointment in 1 week. - with Dr. Mikey Bussing Return A Bathing/ Shower/ Hygiene May shower with protection but do not get wound dressing(s) wet. Edema Control - Lymphedema / SCD / Other Elevate legs to the  level of the heart or above for 30 minutes daily and/or when sitting, a frequency of: - throughout the day Avoid standing for long periods of time. Exercise regularly Compression stocking or Garment 20-30 mm/Hg pressure to: - right leg daily Wound Treatment Wound #1 - Lower Leg Wound Laterality: Left, Posterior Peri-Wound Care: Triamcinolone 15 (g) 1 x Per Week/30 Days Discharge Instructions: Use triamcinolone 15 (g) as directed Peri-Wound Care: Sween Lotion (Moisturizing lotion) 1 x Per Week/30 Days Discharge Instructions: Apply moisturizing lotion as directed Prim Dressing: PolyMem Silver Non-Adhesive Dressing, 4.25x4.25 in 1 x Per Week/30 Days ary Discharge Instructions: Apply to wound bed as instructed Secondary Dressing: Woven Gauze Sponge, Non-Sterile 4x4 in 1 x Per Week/30 Days Discharge Instructions: Apply over primary dressing as directed. Secondary Dressing: ABD Pad, 8x10 1 x Per Week/30 Days Discharge Instructions: Apply over primary dressing as directed. Compression Wrap: ThreePress (3 layer compression wrap) 1 x Per Week/30 Days Discharge Instructions: Apply three layer compression as directed. Compression Stockings: Jobst Farrow Wrap 4000 (DME) Left Leg Compression Amount: 20-30 mmHG Discharge Instructions: Apply Renee Pain daily as instructed. Apply first thing in the morning, remove at night before bed. Electronic Signature(s) Signed: 08/29/2020 2:43:31 PM By: Geralyn Corwin DO Signed: 09/04/2020 5:57:05 PM By: Zandra Abts RN, BSN Previous Signature: 08/29/2020 2:24:20 PM Version By: Geralyn Corwin DO Entered By: Zandra Abts on 08/29/2020 14:25:40 -------------------------------------------------------------------------------- Problem List Details Patient Name: Date of Service: [name]. 08/29/2020 1:15 PM Medical Record Number: [id number] Patient Account Number: [id number] Date of Birth/Sex: Treating RN: [birth year] (86 y.o. Female) Shawn Stall Primary Care  Provider: Fatima Sanger Other Clinician: Referring Provider: Treating Provider/Extender: Ebony Cargo in Treatment: 0 Active Problems ICD-10 Encounter Code Description Active Date MDM Diagnosis I87.323 Chronic venous hypertension (idiopathic) with inflammation of bilateral lower 08/29/2020 No Yes extremity L97.829 Non-pressure chronic ulcer of other part of left lower leg with unspecified 08/29/2020 No Yes severity E11.9 Type 2 diabetes mellitus without complications 08/29/2020 No Yes I50.32 Chronic diastolic (congestive) heart failure 08/29/2020 No Yes Inactive Problems Resolved Problems Electronic Signature(s) Signed: 08/29/2020 2:24:20 PM By: Geralyn Corwin DO Entered By: Geralyn Corwin on 08/29/2020 14:12:53 -------------------------------------------------------------------------------- Progress Note Details Patient Name: Date of Service: [name]. 08/29/2020 1:15 PM Medical Record Number: [id number] Patient Account Number: [id number] Date of Birth/Sex: Treating RN: April 20, 1934 (86 y.o. Female) Shawn Stall Primary Care Provider: Fatima Sanger Other Clinician: Referring Provider: Treating Provider/Extender:  Loraine MapleHoffman, Jayshun Galentine Smith, Fred Weeks in Treatment: 0 Subjective Chief Complaint Information obtained from Patient Bilateral lower extremity swelling with open wounds to the left lower extremity. History of Present Illness (HPI) Admission 6/2 [name] is an 86 year old female with a past medical history of type 2 diabetes on oral agents, chronic diastolic heart failure and venous stasis insufficiency that presents to the clinic for bilateral lower extremity swelling and open wounds to the left lower extremity. She states that 2 months ago she was on her feet for long periods of time walking since she did not have a car. Her legs became more swollen and developed wounds to her legs bilaterally. She states that over time her right lower extremity  wounds healed however she continues to have some wounds to her left leg although much improved. She is wearing some mild compression to the right leg. She has been having her left leg wrapped by her primary care physician's office 3 times a week with Kerlix/Coban. She states she finished her antibiotics that were prescribed there as well for cellulitis. She currently denies signs of infection. Patient History Information obtained from Patient. Allergies aspirin Family History Cancer - Child, Diabetes - Child,Mother,Father, Lung Disease - Child, Seizures - Child, No family history of Heart Disease, Hereditary Spherocytosis, Hypertension, Kidney Disease, Stroke, Thyroid Problems, Tuberculosis. Social History Never smoker, Marital Status - Widowed, Alcohol Use - Never, Drug Use - No History, Caffeine Use - Rarely. Medical History Eyes Patient has history of Cataracts - Surgical Removal Cardiovascular Patient has history of Congestive Heart Failure, Coronary Artery Disease, Hypertension Endocrine Patient has history of Type II Diabetes Musculoskeletal Patient has history of Osteoarthritis Patient is treated with Oral Agents. Blood sugar is not tested. Medical A Surgical History Notes nd Endocrine thyroid nodule Review of Systems (ROS) Eyes Complains or has symptoms of Glasses / Contacts. Ear/Nose/Mouth/Throat Denies complaints or symptoms of Chronic sinus problems or rhinitis. Respiratory Denies complaints or symptoms of Chronic or frequent coughs, Shortness of Breath. Gastrointestinal Denies complaints or symptoms of Frequent diarrhea, Nausea, Vomiting. Genitourinary Denies complaints or symptoms of Frequent urination. Integumentary (Skin) Complains or has symptoms of Wounds. Neurologic Denies complaints or symptoms of Numbness/parasthesias. Psychiatric Denies complaints or symptoms of Claustrophobia, Suicidal. Objective Constitutional respirations regular, non-labored and  within target range for patient.. Vitals Time Taken: 12:58 PM, Height: 62 in, Source: Stated, Weight: 174 lbs, Source: Stated, BMI: 31.8, Temperature: 98.3 F, Pulse: 80 bpm, Respiratory Rate: 18 breaths/min, Blood Pressure: 119/70 mmHg. General Notes: Patient states battery died in glucose meter and not been able to check Cardiovascular 2+ dorsalis pedis/posterior tibialis pulses. Psychiatric pleasant and cooperative. General Notes: Venous stasis dermatitis bilaterally 2+ pitting edema to the knees Left lower extremity: Small wounds limited to skin breakdown. No acute signs of infection. Integumentary (Hair, Skin) Wound #1 status is Open. Original cause of wound was Blister. The date acquired was: 07/22/2020. The wound is located on the Left,Posterior Lower Leg. The wound measures 3cm length x 3cm width x 0.1cm depth; 7.069cm^2 area and 0.707cm^3 volume. There is Fat Layer (Subcutaneous Tissue) exposed. There is no tunneling or undermining noted. There is a medium amount of serosanguineous drainage noted. The wound margin is distinct with the outline attached to the wound base. There is large (67-100%) red, pink granulation within the wound bed. There is no necrotic tissue within the wound bed. Assessment Active Problems ICD-10 Chronic venous hypertension (idiopathic) with inflammation of bilateral lower extremity Non-pressure chronic ulcer of other part of  left lower leg with unspecified severity Type 2 diabetes mellitus without complications Chronic diastolic (congestive) heart failure Patient's wounds appear well-healing with no signs of infection. She would benefit greatly from compression therapy. I recommended PolyMem silver under a 3 layer compression as her ABIs are normal. She states it is hard to put on compression stockings and we will go ahead and order juxta lite for the left leg. I recommended compression stocking to the right leg currently and we will give her information from  elastic therapy. She states her daughter can put this on for her. She can follow-up in 1 week. Procedures Wound #1 Pre-procedure diagnosis of Wound #1 is a Venous Leg Ulcer located on the Left,Posterior Lower Leg .Severity of Tissue Pre Debridement is: Fat layer exposed. There was a Chemical/Enzymatic/Mechanical debridement performed by Zandra Abts, RN.. Other agent used was Anasept and gauze to remove biofilm. There was no bleeding. The procedure was tolerated well with a pain level of 0 throughout and a pain level of 0 following the procedure. Post Debridement Measurements: 3cm length x 3cm width x 0.1cm depth; 0.707cm^3 volume. Character of Wound/Ulcer Post Debridement is improved. Severity of Tissue Post Debridement is: Fat layer exposed. Post procedure Diagnosis Wound #1: Same as Pre-Procedure Pre-procedure diagnosis of Wound #1 is a Venous Leg Ulcer located on the Left,Posterior Lower Leg . There was a Three Layer Compression Therapy Procedure by Zandra Abts, RN. Post procedure Diagnosis Wound #1: Same as Pre-Procedure Plan Follow-up Appointments: Return Appointment in 1 week. - with Dr. Mikey Bussing Bathing/ Shower/ Hygiene: May shower with protection but do not get wound dressing(s) wet. Edema Control - Lymphedema / SCD / Other: Elevate legs to the level of the heart or above for 30 minutes daily and/or when sitting, a frequency of: - throughout the day Avoid standing for long periods of time. Exercise regularly Compression stocking or Garment 20-30 mm/Hg pressure to: - right leg daily WOUND #1: - Lower Leg Wound Laterality: Left, Posterior Peri-Wound Care: Triamcinolone 15 (g) 1 x Per Week/ Discharge Instructions: Use triamcinolone 15 (g) as directed Peri-Wound Care: Sween Lotion (Moisturizing lotion) 1 x Per Week/ Discharge Instructions: Apply moisturizing lotion as directed Prim Dressing: PolyMem Silver Non-Adhesive Dressing, 4.25x4.25 in 1 x Per Week/ ary Discharge  Instructions: Apply to wound bed as instructed Secondary Dressing: Woven Gauze Sponge, Non-Sterile 4x4 in 1 x Per Week/ Discharge Instructions: Apply over primary dressing as directed. Secondary Dressing: ABD Pad, 8x10 1 x Per Week/ Discharge Instructions: Apply over primary dressing as directed. Com pression Wrap: ThreePress (3 layer compression wrap) 1 x Per Week/ Discharge Instructions: Apply three layer compression as directed. 1. PolyMem silver under 3 layer compression 2. Information on compression stockings and juxta light 3. Follow-up in 1 week Electronic Signature(s) Signed: 08/29/2020 2:24:20 PM By: Geralyn Corwin DO Entered By: Geralyn Corwin on 08/29/2020 14:23:06 -------------------------------------------------------------------------------- HxROS Details Patient Name: Date of Service: [name]. 08/29/2020 1:15 PM Medical Record Number: [id number] Patient Account Number: [id number] Date of Birth/Sex: Treating RN: [birth year] (86 y.o. Female) Antonieta Iba Primary Care Provider: Fatima Sanger Other Clinician: Referring Provider: Treating Provider/Extender: Ebony Cargo in Treatment: 0 Information Obtained From Patient Eyes Complaints and Symptoms: Positive for: Glasses / Contacts Medical History: Positive for: Cataracts - Surgical Removal Ear/Nose/Mouth/Throat Complaints and Symptoms: Negative for: Chronic sinus problems or rhinitis Respiratory Complaints and Symptoms: Negative for: Chronic or frequent coughs; Shortness of Breath Gastrointestinal Complaints and Symptoms: Negative for: Frequent diarrhea; Nausea; Vomiting Genitourinary Complaints  and Symptoms: Negative for: Frequent urination Integumentary (Skin) Complaints and Symptoms: Positive for: Wounds Neurologic Complaints and Symptoms: Negative for: Numbness/parasthesias Psychiatric Complaints and Symptoms: Negative for: Claustrophobia;  Suicidal Hematologic/Lymphatic Cardiovascular Medical History: Positive for: Congestive Heart Failure; Coronary Artery Disease; Hypertension Endocrine Medical History: Positive for: Type II Diabetes Past Medical History Notes: thyroid nodule Time with diabetes: 10+ years Treated with: Oral agents Blood sugar tested every day: No Immunological Musculoskeletal Medical History: Positive for: Osteoarthritis Oncologic HBO Extended History Items Eyes: Cataracts Immunizations Pneumococcal Vaccine: Received Pneumococcal Vaccination: No Implantable Devices None Family and Social History Cancer: Yes - Child; Diabetes: Yes - Child,Mother,Father; Heart Disease: No; Hereditary Spherocytosis: No; Hypertension: No; Kidney Disease: No; Lung Disease: Yes - Child; Seizures: Yes - Child; Stroke: No; Thyroid Problems: No; Tuberculosis: No; Never smoker; Marital Status - Widowed; Alcohol Use: Never; Drug Use: No History; Caffeine Use: Rarely; Financial Concerns: No; Food, Clothing or Shelter Needs: No; Support System Lacking: No; Transportation Concerns: No Electronic Signature(s) Signed: 08/29/2020 2:24:20 PM By: Geralyn Corwin DO Signed: 08/29/2020 5:54:05 PM By: Antonieta Iba Entered By: Antonieta Iba on 08/29/2020 13:09:31 -------------------------------------------------------------------------------- SuperBill Details Patient Name: Date of Service: [name]. 08/29/2020 Medical Record Number: [id number] Patient Account Number: [id number] Date of Birth/Sex: Treating RN: [birth year] (86 y.o. Female) Zandra Abts Primary Care Provider: Fatima Sanger Other Clinician: Referring Provider: Treating Provider/Extender: Ebony Cargo in Treatment: 0 Diagnosis Coding ICD-10 Codes Code Description [phone] Chronic venous hypertension (idiopathic) with inflammation of bilateral lower extremity L97.829 Non-pressure chronic ulcer of other part of left lower leg with  unspecified severity E11.9 Type 2 diabetes mellitus without complications I50.32 Chronic diastolic (congestive) heart failure Facility Procedures CPT4 Code: [id number] Description: 99213 - WOUND CARE VISIT-LEV 3 EST PT Modifier: 25 Quantity: 1 CPT4 Code: [id number] Description: 28413 - DEBRIDE W/O ANES NON SELECT Modifier: Quantity: 1 Physician Procedures Electronic Signature(s) Signed: 08/29/2020 2:24:20 PM By: Geralyn Corwin DO Entered By: Geralyn Corwin on 08/29/2020 14:23:39

## 2020-09-04 NOTE — Progress Notes (Signed)
[name] Fowler, [name] M. ([id number]) Visit Report for 08/29/2020 Allergy List Details Patient Name: Date of Service: [name], [name] YE M. 08/29/2020 1:15 PM Medical Record Number: [id number] Patient Account Number: [id number] Date of Birth/Sex: Treating RN: [birth year] (86 y.o. Female) Antonieta IbaBarnhart, Jodi Primary Care Hannalee Castor: Fatima SangerSmith, Fred Other Clinician: Referring Ronit Cranfield: Treating Amberly Livas/Extender: Loraine MapleHoffman, Jessica Smith, Fred Weeks in Treatment: 0 Allergies Active Allergies aspirin Allergy Notes Electronic Signature(s) Signed: 08/29/2020 5:54:05 PM By: Antonieta IbaBarnhart, Jodi Entered By: Antonieta IbaBarnhart, Jodi on 08/29/2020 13:03:38 -------------------------------------------------------------------------------- Arrival Information Details Patient Name: Date of Service: [name] Fowler, [name] YE M. 08/29/2020 1:15 PM Medical Record Number: [id number] Patient Account Number: [id number] Date of Birth/Sex: Treating RN: [birth year] (86 y.o. Female) Antonieta IbaBarnhart, Jodi Primary Care Seibert Keeter: Fatima SangerSmith, Fred Other Clinician: Referring Jazmyn Offner: Treating Marley Charlot/Extender: Ebony CargoHoffman, Jessica Smith, Fred Weeks in Treatment: 0 Visit Information Patient Arrived: Ambulatory Arrival Time: 12:51 Accompanied By: alone Transfer Assistance: None Patient Identification Verified: Yes Secondary Verification Process Completed: Yes Patient Requires Transmission-Based Precautions: No Patient Has Alerts: No Electronic Signature(s) Signed: 09/04/2020 5:57:05 PM By: Zandra AbtsLynch, Shatara RN, BSN Entered By: Zandra AbtsLynch, Shatara on 08/29/2020 13:48:24 -------------------------------------------------------------------------------- Clinic Level of Care Assessment Details Patient Name: Date of Service: [name], [name] 08/29/2020 1:15 PM Medical Record Number: [id number] Patient Account Number: [id number] Date of Birth/Sex: Treating RN: [birth year] (86 y.o. Female) Zandra AbtsLynch, Shatara Primary Care Shakela Donati: Fatima SangerSmith, Fred Other Clinician: Referring Samyrah Bruster: Treating Niaja Stickley/Extender:  Ebony CargoHoffman, Jessica Smith, Fred Weeks in Treatment: 0 Clinic Level of Care Assessment Items TOOL 1 Quantity Score X- 1 0 Use when EandM and Procedure is performed on INITIAL visit ASSESSMENTS - Nursing Assessment / Reassessment X- 1 20 General Physical Exam (combine w/ comprehensive assessment (listed just below) when performed on new pt. evals) X- 1 25 Comprehensive Assessment (HX, ROS, Risk Assessments, Wounds Hx, etc.) ASSESSMENTS - Wound and Skin Assessment / Reassessment []  - 0 Dermatologic / Skin Assessment (not related to wound area) ASSESSMENTS - Ostomy and/or Continence Assessment and Care []  - 0 Incontinence Assessment and Management []  - 0 Ostomy Care Assessment and Management (repouching, etc.) PROCESS - Coordination of Care X - Simple Patient / Family Education for ongoing care 1 15 []  - 0 Complex (extensive) Patient / Family Education for ongoing care X- 1 10 Staff obtains ChiropractorConsents, Records, T Results / Process Orders est []  - 0 Staff telephones HHA, Nursing Homes / Clarify orders / etc []  - 0 Routine Transfer to another Facility (non-emergent condition) []  - 0 Routine Hospital Admission (non-emergent condition) X- 1 15 New Admissions / Manufacturing engineernsurance Authorizations / Ordering NPWT Apligraf, etc. , []  - 0 Emergency Hospital Admission (emergent condition) PROCESS - Special Needs []  - 0 Pediatric / Minor Patient Management []  - 0 Isolation Patient Management []  - 0 Hearing / Language / Visual special needs []  - 0 Assessment of Community assistance (transportation, D/C planning, etc.) []  - 0 Additional assistance / Altered mentation []  - 0 Support Surface(s) Assessment (bed, cushion, seat, etc.) INTERVENTIONS - Miscellaneous []  - 0 External ear exam []  - 0 Patient Transfer (multiple staff / Nurse, adultHoyer Lift / Similar devices) []  - 0 Simple Staple / Suture removal (25 or less) []  - 0 Complex Staple / Suture removal (26 or more) []  - 0 Hypo/Hyperglycemic  Management (do not check if billed separately) X- 1 15 Ankle / Brachial Index (ABI) - do not check if billed separately Has the patient been seen at the hospital within the last three years: Yes Total Score: 100 Level Of Care: New/Established - Level 3 Electronic Signature(s) Signed:  09/04/2020 5:57:05 PM By: Zandra Abts RN, BSN Entered By: Zandra Abts on 08/29/2020 14:21:21 -------------------------------------------------------------------------------- Compression Therapy Details Patient Name: Date of Service: Maria Belt. 08/29/2020 1:15 PM Medical Record Number: [id number] Patient Account Number: [id number] Date of Birth/Sex: Treating RN: [birth year] (86 y.o. Female) Zandra Abts Primary Care Milda Lindvall: Fatima Sanger Other Clinician: Referring Yashika Mask: Treating Yanitza Shvartsman/Extender: Ebony Cargo in Treatment: 0 Compression Therapy Performed for Wound Assessment: Wound #1 Left,Posterior Lower Leg Performed By: Clinician Zandra Abts, RN Compression Type: Three Layer Post Procedure Diagnosis Same as Pre-procedure Electronic Signature(s) Signed: 09/04/2020 5:57:05 PM By: Zandra Abts RN, BSN Entered By: Zandra Abts on 08/29/2020 14:08:00 -------------------------------------------------------------------------------- Encounter Discharge Information Details Patient Name: Date of Service: Maria Belt. 08/29/2020 1:15 PM Medical Record Number: [id number] Patient Account Number: [id number] Date of Birth/Sex: Treating RN: [birth year] (86 y.o. Female) Zandra Abts Primary Care Ervey Fallin: Fatima Sanger Other Clinician: Referring Terrace Chiem: Treating Adynn Caseres/Extender: Ebony Cargo in Treatment: 0 Encounter Discharge Information Items Post Procedure Vitals Discharge Condition: Stable Temperature (F): 98.3 Ambulatory Status: Ambulatory Pulse (bpm): 80 Discharge Destination: Home Respiratory Rate (breaths/min): 18 Transportation:  Private Auto Blood Pressure (mmHg): 119/70 Accompanied By: alone Schedule Follow-up Appointment: Yes Clinical Summary of Care: Patient Declined Electronic Signature(s) Signed: 09/04/2020 5:57:05 PM By: Zandra Abts RN, BSN Entered By: Zandra Abts on 08/29/2020 14:22:31 -------------------------------------------------------------------------------- Lower Extremity Assessment Details Patient Name: Date of Service: Maria Belt. 08/29/2020 1:15 PM Medical Record Number: [id number] Patient Account Number: [id number] Date of Birth/Sex: Treating RN: 1934-11-03 (86 y.o. Female) Antonieta Iba Primary Care Nicholaus Steinke: Fatima Sanger Other Clinician: Referring Alveena Taira: Treating [place] Want/Extender: Ebony Cargo in Treatment: 0 Edema Assessment Assessed: Kyra Searles: No] [Right: No] E[Left: dema] [Right: :] Calf Left: Right: Point of Measurement: 28 cm From Medial Instep 42.6 cm 41 cm Ankle Left: Right: Point of Measurement: 7 cm From Medial Instep 33.5 cm 29 cm Knee To Floor Left: Right: From Medial Instep 35 cm 35 cm Vascular Assessment Pulses: Dorsalis Pedis Palpable: [Left:Yes] [Right:Yes] Blood Pressure: Brachial: [Left:119] [Right:119] Ankle: [Left:Dorsalis Pedis: 140 1.18] [Right:Dorsalis Pedis: 150 1.26] Notes Not able to tolerate ABI on left due to pain Electronic Signature(s) Signed: 08/29/2020 5:54:05 PM By: Antonieta Iba Signed: 09/04/2020 5:57:05 PM By: Zandra Abts RN, BSN Entered By: Zandra Abts on 08/29/2020 13:55:25 -------------------------------------------------------------------------------- Multi Wound Chart Details Patient Name: Date of Service: Maria Belt. 08/29/2020 1:15 PM Medical Record Number: [id number] Patient Account Number: [id number] Date of Birth/Sex: Treating RN: [birth year] (86 y.o. Female) Shawn Stall Primary Care Maicie Vanderloop: Fatima Sanger Other Clinician: Referring Carder Yin: Treating Barney Russomanno/Extender: Ebony Cargo in Treatment: 0 Vital Signs Height(in): 62 Pulse(bpm): 80 Weight(lbs): 174 Blood Pressure(mmHg): 119/70 Body Mass Index(BMI): 32 Temperature(F): 98.3 Respiratory Rate(breaths/min): 18 Photos: [1:No Photos Left, Posterior Lower Leg] [N/A:N/A N/A] Wound Location: [1:Blister] [N/A:N/A] Wounding Event: [1:Venous Leg Ulcer] [N/A:N/A] Primary Etiology: [1:Cataracts, Congestive Heart Failure,] [N/A:N/A] Comorbid History: [1:Coronary Artery Disease, Hypertension, Type II Diabetes, Osteoarthritis 07/22/2020] [N/A:N/A] Date Acquired: [1:0] [N/A:N/A] Weeks of Treatment: [1:Open] [N/A:N/A] Wound Status: [1:Yes] [N/A:N/A] Clustered Wound: [1:3x3x0.1] [N/A:N/A] Measurements L x W x D (cm) [1:7.069] [N/A:N/A] A (cm) : rea [1:0.707] [N/A:N/A] Volume (cm) : [1:Full Thickness Without Exposed] [N/A:N/A] Classification: [1:Support Structures Medium] [N/A:N/A] Exudate A mount: [1:Serosanguineous] [N/A:N/A] Exudate Type: [1:red, brown] [N/A:N/A] Exudate Color: [1:Distinct, outline attached] [N/A:N/A] Wound Margin: [1:Large (67-100%)] [N/A:N/A] Granulation A mount: [1:Red, Pink] [N/A:N/A] Granulation Quality: [1:None Present (0%)] [N/A:N/A] Necrotic A mount: [1:Fat Layer (Subcutaneous Tissue): Yes  N/A] Exposed Structures: [1:Fascia: No Tendon: No Muscle: No Joint: No Bone: No None] [N/A:N/A] Epithelialization: [1:Chemical/Enzymatic/Mechanical] [N/A:N/A] Debridement: [1:N/A] [N/A:N/A] Instrument: [1:None] [N/A:N/A] Bleeding: [1:0] [N/A:N/A] Procedural Pain: [1:0] [N/A:N/A] Post Procedural Pain: Debridement Treatment Response: Procedure was tolerated well [N/A:N/A] Post Debridement Measurements L x 3x3x0.1 [N/A:N/A] W x D (cm) [1:0.707] [N/A:N/A] Post Debridement Volume: (cm) [1:Compression Therapy] [N/A:N/A] Procedures Performed: [1:Debridement] Treatment Notes Electronic Signature(s) Signed: 08/29/2020 2:24:20 PM By: Geralyn Corwin DO Signed: 08/29/2020 5:16:10  PM By: Shawn Stall Entered By: Geralyn Corwin on 08/29/2020 14:13:01 -------------------------------------------------------------------------------- Multi-Disciplinary Care Plan Details Patient Name: Date of Service: Maria Belt. 08/29/2020 1:15 PM Medical Record Number: [id number] Patient Account Number: [id number] Date of Birth/Sex: Treating RN: July 09, 1934 (86 y.o. Female) Zandra Abts Primary Care Aarna Mihalko: Fatima Sanger Other Clinician: Referring Arnie Maiolo: Treating Ayjah Show/Extender: Ebony Cargo in Treatment: 0 Multidisciplinary Care Plan reviewed with physician Active Inactive Nutrition Nursing Diagnoses: Impaired glucose control: actual or potential Potential for alteratiion in Nutrition/Potential for imbalanced nutrition Goals: Patient/caregiver agrees to and verbalizes understanding of need to use nutritional supplements and/or vitamins as prescribed Date Initiated: 08/29/2020 Target Resolution Date: 09/27/2020 Goal Status: Active Patient/caregiver will maintain therapeutic glucose control Date Initiated: 08/29/2020 Target Resolution Date: 09/27/2020 Goal Status: Active Interventions: Assess HgA1c results as ordered upon admission and as needed Assess patient nutrition upon admission and as needed per policy Provide education on elevated blood sugars and impact on wound healing Provide education on nutrition Notes: Venous Leg Ulcer Nursing Diagnoses: Knowledge deficit related to disease process and management Goals: Patient will maintain optimal edema control Date Initiated: 08/29/2020 Target Resolution Date: 09/27/2020 Goal Status: Active Patient/caregiver will verbalize understanding of disease process and disease management Date Initiated: 08/29/2020 Target Resolution Date: 09/27/2020 Goal Status: Active Interventions: Assess peripheral edema status every visit. Compression as ordered Provide education on venous  insufficiency Notes: Wound/Skin Impairment Nursing Diagnoses: Impaired tissue integrity Knowledge deficit related to ulceration/compromised skin integrity Goals: Patient/caregiver will verbalize understanding of skin care regimen Date Initiated: 08/29/2020 Target Resolution Date: 09/27/2020 Goal Status: Active Ulcer/skin breakdown will have a volume reduction of 30% by week 4 Date Initiated: 08/29/2020 Target Resolution Date: 09/27/2020 Goal Status: Active Interventions: Assess patient/caregiver ability to obtain necessary supplies Assess patient/caregiver ability to perform ulcer/skin care regimen upon admission and as needed Assess ulceration(s) every visit Provide education on ulcer and skin care Notes: Electronic Signature(s) Signed: 09/04/2020 5:57:05 PM By: Zandra Abts RN, BSN Entered By: Zandra Abts on 08/29/2020 13:46:23 -------------------------------------------------------------------------------- Pain Assessment Details Patient Name: Date of Service: Maria Belt. 08/29/2020 1:15 PM Medical Record Number: [id number] Patient Account Number: [id number] Date of Birth/Sex: Treating RN: May 28, 1934 (86 y.o. Female) Antonieta Iba Primary Care Ashden Sonnenberg: Fatima Sanger Other Clinician: Referring Sharice Harriss: Treating Phil Corti/Extender: Ebony Cargo in Treatment: 0 Active Problems Location of Pain Severity and Description of Pain Patient Has Paino Yes Site Locations With Dressing Change: Yes Duration of the Pain. Constant / Intermittento Intermittent Rate the pain. Current Pain Level: 8 Character of Pain Describe the Pain: Burning, Tender Pain Management and Medication Current Pain Management: Medication: Yes Cold Application: No Rest: Yes Massage: No Activity: No T.E.N.S.: No Heat Application: No Leg drop or elevation: No Is the Current Pain Management Adequate: Inadequate How does your wound impact your activities of daily livingo Sleep:  Yes Bathing: No Appetite: No Relationship With Others: No Bladder Continence: No Emotions: No Bowel Continence: No Work: No Toileting: No Drive: No Dressing: No Hobbies: No Electronic Signature(s) Signed: 08/29/2020  5:54:05 PM By: Bo Mcclintock By: Antonieta Iba on 08/29/2020 13:12:59 -------------------------------------------------------------------------------- Patient/Caregiver Education Details Patient Name: Date of Service: Maria Belt 6/2/2022andnbsp1:15 PM Medical Record Number: [id number] Patient Account Number: [id number] Date of Birth/Gender: Treating RN: 12/28/1934 (86 y.o. Female) Zandra Abts Primary Care Physician: Fatima Sanger Other Clinician: Referring Physician: Treating Physician/Extender: Ebony Cargo in Treatment: 0 Education Assessment Education Provided To: Patient Education Topics Provided Elevated Blood Sugar/ Impact on Healing: Methods: Explain/Verbal Responses: State content correctly Nutrition: Methods: Explain/Verbal Responses: State content correctly Venous: Handouts: Controlling Swelling with Compression Stockings , Managing Venous Disease and Related Ulcers Methods: Explain/Verbal Responses: State content correctly Wound/Skin Impairment: Methods: Explain/Verbal Responses: State content correctly Electronic Signature(s) Signed: 09/04/2020 5:57:05 PM By: Zandra Abts RN, BSN Entered By: Zandra Abts on 08/29/2020 13:47:05 -------------------------------------------------------------------------------- Wound Assessment Details Patient Name: Date of Service: Maria Belt. 08/29/2020 1:15 PM Medical Record Number: [id number] Patient Account Number: [id number] Date of Birth/Sex: Treating RN: [birth year] (86 y.o. Female) Antonieta Iba Primary Care Yuniel Blaney: Fatima Sanger Other Clinician: Referring Shacora Zynda: Treating Kiwanna Spraker/Extender: Ebony Cargo in Treatment: 0 Wound  Status Wound Number: 1 Primary Venous Leg Ulcer Etiology: Wound Location: Left, Posterior Lower Leg Wound Open Wounding Event: Blister Status: Date Acquired: 07/22/2020 Comorbid Cataracts, Congestive Heart Failure, Coronary Artery Disease, Weeks Of Treatment: 0 History: Hypertension, Type II Diabetes, Osteoarthritis Clustered Wound: Yes Photos Wound Measurements Length: (cm) 3 Width: (cm) 3 Depth: (cm) 0.1 Area: (cm) 7.069 Volume: (cm) 0.707 % Reduction in Area: 0% % Reduction in Volume: 0% Epithelialization: None Tunneling: No Undermining: No Wound Description Classification: Full Thickness Without Exposed Support Structures Wound Margin: Distinct, outline attached Exudate Amount: Medium Exudate Type: Serosanguineous Exudate Color: red, brown Foul Odor After Cleansing: No Slough/Fibrino No Wound Bed Granulation Amount: Large (67-100%) Exposed Structure Granulation Quality: Red, Pink Fascia Exposed: No Necrotic Amount: None Present (0%) Fat Layer (Subcutaneous Tissue) Exposed: Yes Tendon Exposed: No Muscle Exposed: No Joint Exposed: No Bone Exposed: No Treatment Notes Wound #1 (Lower Leg) Wound Laterality: Left, Posterior Cleanser Peri-Wound Care Triamcinolone 15 (g) Discharge Instruction: Use triamcinolone 15 (g) as directed Sween Lotion (Moisturizing lotion) Discharge Instruction: Apply moisturizing lotion as directed Topical Primary Dressing PolyMem Silver Non-Adhesive Dressing, 4.25x4.25 in Discharge Instruction: Apply to wound bed as instructed Secondary Dressing Woven Gauze Sponge, Non-Sterile 4x4 in Discharge Instruction: Apply over primary dressing as directed. ABD Pad, 8x10 Discharge Instruction: Apply over primary dressing as directed. Secured With Compression Wrap ThreePress (3 layer compression wrap) Discharge Instruction: Apply three layer compression as directed. Compression Stockings Add-Ons Electronic Signature(s) Signed: 08/29/2020  5:54:05 PM By: Antonieta Iba Signed: 08/30/2020 5:48:41 PM By: Karl Ito Entered By: Karl Ito on 08/29/2020 16:53:05 -------------------------------------------------------------------------------- Vitals Details Patient Name: Date of Service: Maria Belt. 08/29/2020 1:15 PM Medical Record Number: [id number] Patient Account Number: [id number] Date of Birth/Sex: Treating RN: 02-12-1935 (86 y.o. Female) Antonieta Iba Primary Care Cleotis Sparr: Fatima Sanger Other Clinician: Referring Keyden Pavlov: Treating Daniell Mancinas/Extender: Ebony Cargo in Treatment: 0 Vital Signs Time Taken: 12:58 Temperature (F): 98.3 Height (in): 62 Pulse (bpm): 80 Source: Stated Respiratory Rate (breaths/min): 18 Weight (lbs): 174 Blood Pressure (mmHg): 119/70 Source: Stated Reference Range: 80 - 120 mg / dl Body Mass Index (BMI): 31.8 Notes Patient states battery died in glucose meter and not been able to check Electronic Signature(s) Signed: 08/29/2020 5:54:05 PM By: Antonieta Iba Entered By: Antonieta Iba on 08/29/2020 13:03:12

## 2020-09-05 ENCOUNTER — Other Ambulatory Visit: Payer: Self-pay

## 2020-09-05 ENCOUNTER — Encounter (HOSPITAL_BASED_OUTPATIENT_CLINIC_OR_DEPARTMENT_OTHER): Payer: Medicare (Managed Care) | Admitting: Internal Medicine

## 2020-09-05 DIAGNOSIS — I87323 Chronic venous hypertension (idiopathic) with inflammation of bilateral lower extremity: Secondary | ICD-10-CM

## 2020-09-05 DIAGNOSIS — E11622 Type 2 diabetes mellitus with other skin ulcer: Secondary | ICD-10-CM | POA: Diagnosis not present

## 2020-09-05 DIAGNOSIS — L97829 Non-pressure chronic ulcer of other part of left lower leg with unspecified severity: Secondary | ICD-10-CM

## 2020-09-05 NOTE — Progress Notes (Signed)
[name] ([id number]) Visit Report for 09/05/2020 Chief Complaint Document Details Patient Name: Date of Service: [name] 09/05/2020 12:45 PM Medical Record Number: [id number] Patient Account Number: [id number] Date of Birth/Sex: Treating RN: 08-08-1934 (86 y.o. [name] Primary Care Provider: Odetta Pink Other Clinician: Referring Provider: Treating Provider/Extender: Ebony Cargo in Treatment: 1 Information Obtained from: Patient Chief Complaint Bilateral lower extremity swelling with open wounds to the left lower extremity. Electronic Signature(s) Signed: 09/05/2020 1:24:01 PM By: Geralyn Corwin DO Entered By: Geralyn Corwin on 09/05/2020 13:11:44 -------------------------------------------------------------------------------- HPI Details Patient Name: Date of Service: [name], [name]. 09/05/2020 12:45 PM Medical Record Number: [id number] Patient Account Number: [id number] Date of Birth/Sex: Treating RN: 08-02-1934 (86 y.o. [name] Primary Care Provider: Odetta Pink Other Clinician: Referring Provider: Treating Provider/Extender: Ebony Cargo in Treatment: 1 History of Present Illness HPI Description: Admission 6/2 [name] is an 86 year old female with a past medical history of type 2 diabetes on oral agents, chronic diastolic heart failure and venous stasis insufficiency that presents to the clinic for bilateral lower extremity swelling and open wounds to the left lower extremity. She states that 2 months ago she was on her feet for long periods of time walking since she did not have a car. Her legs became more swollen and developed wounds to her legs bilaterally. She states that over time her right lower extremity wounds healed however she continues to have some wounds to her left leg although much improved. She is wearing some mild compression to the right leg. She has been having her left  leg wrapped by her primary care physician's office 3 times a week with Kerlix/Coban. She states she finished her antibiotics that were prescribed there as well for cellulitis. She currently denies signs of infection. 6/9; patient presents for 1 week follow-up. She has been using the 3 layer compression wrap and tolerating this well. She denies signs of infection. She received the compression wrap for the left leg in the mail and has brought this in today. Electronic Signature(s) Signed: 09/05/2020 1:24:01 PM By: Geralyn Corwin DO Entered By: Geralyn Corwin on 09/05/2020 13:12:56 -------------------------------------------------------------------------------- Physical Exam Details Patient Name: Date of Service: [name], [name]. 09/05/2020 12:45 PM Medical Record Number: [id number] Patient Account Number: [id number] Date of Birth/Sex: Treating RN: 02/14/1935 (86 y.o. [name] Primary Care Provider: Odetta Pink Other Clinician: Referring Provider: Treating Provider/Extender: Ebony Cargo in Treatment: 1 Constitutional respirations regular, non-labored and within target range for patient.. Cardiovascular 2+ dorsalis pedis/posterior tibialis pulses. Psychiatric pleasant and cooperative. Notes Left lower extremity: Epithelialization noted throughout the previous wound sites. No obvious signs of infection. Excellent edema control due to the compression wrap. Venous stasis dermatitis bilaterally. Electronic Signature(s) Signed: 09/05/2020 1:24:01 PM By: Geralyn Corwin DO Entered By: Geralyn Corwin on 09/05/2020 13:14:15 -------------------------------------------------------------------------------- Physician Orders Details Patient Name: Date of Service: [name]. 09/05/2020 12:45 PM Medical Record Number: [id number] Patient Account Number: [id number] Date of Birth/Sex: Treating RN: March 23, 1935 (86 y.o. [name] Primary Care Provider:  Odetta Pink Other Clinician: Referring Provider: Treating Provider/Extender: Ebony Cargo in Treatment: 1 Verbal / Phone Orders: No Diagnosis Coding ICD-10 Coding Code Description [phone] Chronic venous hypertension (idiopathic) with inflammation of bilateral lower extremity L97.829 Non-pressure chronic ulcer of other part of left lower leg with unspecified severity E11.9 Type 2 diabetes mellitus without complications I50.32 Chronic diastolic (congestive) heart failure Discharge From  [facility] Services Discharge from Wound Care Center - Wound Healed!! Edema Control - Lymphedema / SCD / Other Elevate legs to the level of the heart or above for 30 minutes daily and/or when sitting, a frequency of: - throughout the day Avoid standing for long periods of time. Exercise regularly Compression stocking or Garment 20-30 mm/Hg pressure to: - compression sock to right leg daily, Farrow wrap to left leg daily Electronic Signature(s) Signed: 09/05/2020 1:24:01 PM By: Geralyn Corwin DO Entered By: Geralyn Corwin on 09/05/2020 13:14:37 -------------------------------------------------------------------------------- Problem List Details Patient Name: Date of Service: [name]. 09/05/2020 12:45 PM Medical Record Number: [id number] Patient Account Number: [id number] Date of Birth/Sex: Treating RN: Jul 28, 1934 (86 y.o. [name] Primary Care Provider: Odetta Pink Other Clinician: Referring Provider: Treating Provider/Extender: Ebony Cargo in Treatment: 1 Active Problems ICD-10 Encounter Code Description Active Date MDM Diagnosis I87.323 Chronic venous hypertension (idiopathic) with inflammation of bilateral lower 08/29/2020 No Yes extremity L97.829 Non-pressure chronic ulcer of other part of left lower leg with unspecified 08/29/2020 No Yes severity E11.9 Type 2 diabetes mellitus without complications 08/29/2020 No Yes I50.32 Chronic  diastolic (congestive) heart failure 08/29/2020 No Yes Inactive Problems Resolved Problems Electronic Signature(s) Signed: 09/05/2020 1:24:01 PM By: Geralyn Corwin DO Entered By: Geralyn Corwin on 09/05/2020 13:11:32 -------------------------------------------------------------------------------- Progress Note Details Patient Name: Date of Service: [name]. 09/05/2020 12:45 PM Medical Record Number: [id number] Patient Account Number: [id number] Date of Birth/Sex: Treating RN: 27-Sep-1934 (86 y.o. [name] Primary Care Provider: Odetta Pink Other Clinician: Referring Provider: Treating Provider/Extender: Ebony Cargo in Treatment: 1 Subjective Chief Complaint Information obtained from Patient Bilateral lower extremity swelling with open wounds to the left lower extremity. History of Present Illness (HPI) Admission 6/2 [name] is an 86 year old female with a past medical history of type 2 diabetes on oral agents, chronic diastolic heart failure and venous stasis insufficiency that presents to the clinic for bilateral lower extremity swelling and open wounds to the left lower extremity. She states that 2 months ago she was on her feet for long periods of time walking since she did not have a car. Her legs became more swollen and developed wounds to her legs bilaterally. She states that over time her right lower extremity wounds healed however she continues to have some wounds to her left leg although much improved. She is wearing some mild compression to the right leg. She has been having her left leg wrapped by her primary care physician's office 3 times a week with Kerlix/Coban. She states she finished her antibiotics that were prescribed there as well for cellulitis. She currently denies signs of infection. 6/9; patient presents for 1 week follow-up. She has been using the 3 layer compression wrap and tolerating this well. She denies signs of  infection. She received the compression wrap for the left leg in the mail and has brought this in today. Patient History Information obtained from Patient. Family History Cancer - Child, Diabetes - Child,Mother,Father, Lung Disease - Child, Seizures - Child, No family history of Heart Disease, Hereditary Spherocytosis, Hypertension, Kidney Disease, Stroke, Thyroid Problems, Tuberculosis. Social History Never smoker, Marital Status - Widowed, Alcohol Use - Never, Drug Use - No History, Caffeine Use - Rarely. Medical History Eyes Patient has history of Cataracts - Surgical Removal Cardiovascular Patient has history of Congestive Heart Failure, Coronary Artery Disease, Hypertension Endocrine Patient has history of Type II Diabetes Musculoskeletal Patient has history of Osteoarthritis Medical  A Surgical History Notes nd Endocrine thyroid nodule Objective Constitutional respirations regular, non-labored and within target range for patient.. Vitals Time Taken: 12:51 PM, Height: 62 in, Weight: 174 lbs, BMI: 31.8, Temperature: 98.7 F, Pulse: 78 bpm, Respiratory Rate: 18 breaths/min, Blood Pressure: 125/70 mmHg. Cardiovascular 2+ dorsalis pedis/posterior tibialis pulses. Psychiatric pleasant and cooperative. General Notes: Left lower extremity: Epithelialization noted throughout the previous wound sites. No obvious signs of infection. Excellent edema control due to the compression wrap. Venous stasis dermatitis bilaterally. Integumentary (Hair, Skin) Wound #1 status is Healed - Epithelialized. Original cause of wound was Blister. The date acquired was: 07/22/2020. The wound has been in treatment 1 weeks. The wound is located on the Left,Posterior Lower Leg. The wound measures 0cm length x 0cm width x 0cm depth; 0cm^2 area and 0cm^3 volume. Assessment Active Problems ICD-10 Chronic venous hypertension (idiopathic) with inflammation of bilateral lower extremity Non-pressure chronic  ulcer of other part of left lower leg with unspecified severity Type 2 diabetes mellitus without complications Chronic diastolic (congestive) heart failure Patient's wounds have healed with 3 layer compression therapy. She brought in her compression wrap and we will place this today. We went over the instructions of using these daily. She was unable to obtain the compression stocking for the right leg however does have a TED hose. I recommended she use this if she is unable to obtain the compression stockings. She can follow-up as needed. Plan Discharge From [facility] Services: Discharge from Wound Care Center - Wound Healed!! Edema Control - Lymphedema / SCD / Other: Elevate legs to the level of the heart or above for 30 minutes daily and/or when sitting, a frequency of: - throughout the day Avoid standing for long periods of time. Exercise regularly Compression stocking or Garment 20-30 mm/Hg pressure to: - compression sock to right leg daily, Farrow wrap to left leg daily 1. Discharge from the clinic due to closed wounds 2. Compression wrap daily 3. Follow-up as needed Electronic Signature(s) Signed: 09/05/2020 1:24:01 PM By: Geralyn Corwin DO Entered By: Geralyn Corwin on 09/05/2020 13:16:47 -------------------------------------------------------------------------------- HxROS Details Patient Name: Date of Service: [name], [name]. 09/05/2020 12:45 PM Medical Record Number: [id number] Patient Account Number: [id number] Date of Birth/Sex: Treating RN: [birth year] (86 y.o. [name], [name] Primary Care Provider: Odetta Pink Other Clinician: Referring Provider: Treating Provider/Extender: Ebony Cargo in Treatment: 1 Information Obtained From Patient Eyes Medical History: Positive for: Cataracts - Surgical Removal Cardiovascular Medical History: Positive for: Congestive Heart Failure; Coronary Artery Disease; Hypertension Endocrine Medical  History: Positive for: Type II Diabetes Past Medical History Notes: thyroid nodule Time with diabetes: 10+ years Treated with: Oral agents Blood sugar tested every day: No Musculoskeletal Medical History: Positive for: Osteoarthritis HBO Extended History Items Eyes: Cataracts Immunizations Pneumococcal Vaccine: Received Pneumococcal Vaccination: No Implantable Devices None Family and Social History Cancer: Yes - Child; Diabetes: Yes - Child,Mother,Father; Heart Disease: No; Hereditary Spherocytosis: No; Hypertension: No; Kidney Disease: No; Lung Disease: Yes - Child; Seizures: Yes - Child; Stroke: No; Thyroid Problems: No; Tuberculosis: No; Never smoker; Marital Status - Widowed; Alcohol Use: Never; Drug Use: No History; Caffeine Use: Rarely; Financial Concerns: No; Food, Clothing or Shelter Needs: No; Support System Lacking: No; Transportation Concerns: No Electronic Signature(s) Signed: 09/05/2020 1:24:01 PM By: Geralyn Corwin DO Signed: 09/05/2020 6:02:04 PM By: Shawn Stall Entered By: Geralyn Corwin on 09/05/2020 13:13:13 -------------------------------------------------------------------------------- SuperBill Details Patient Name: Date of Service: [name]. 09/05/2020 Medical Record Number: [id number] Patient Account Number: [id number]  Date of Birth/Sex: Treating RN: 1934-10-27 (86 y.o. [name]) [name], Millard.LoaBobbi Primary Care Provider: Odetta Pinkichardson, Derek Other Clinician: Referring Provider: Treating Provider/Extender: Ebony CargoHoffman, Wylan Gentzler Smith, Fred Weeks in Treatment: 1 Diagnosis Coding ICD-10 Codes Code Description [id number] Chronic venous hypertension (idiopathic) with inflammation of bilateral lower extremity L97.829 Non-pressure chronic ulcer of other part of left lower leg with unspecified severity E11.9 Type 2 diabetes mellitus without complications I50.32 Chronic diastolic (congestive) heart failure Facility Procedures CPT4 Code: [id number] Description: 99213 -  WOUND CARE VISIT-LEV 3 EST PT Modifier: Quantity: 1 Physician Procedures : CPT4 Code Description Modifier [id number] 99213 - WC PHYS LEVEL 3 - EST PT ICD-10 Diagnosis Description I87.323 Chronic venous hypertension (idiopathic) with inflammation of bilateral lower extremity L97.829 Non-pressure chronic ulcer of other part of  left lower leg with unspecified severity Quantity: 1 Electronic Signature(s) Signed: 09/05/2020 5:07:33 PM By: Geralyn CorwinHoffman, Javon Snee DO Signed: 09/05/2020 6:15:37 PM By: Zandra AbtsLynch, Shatara RN, BSN Previous Signature: 09/05/2020 1:24:01 PM Version By: Geralyn CorwinHoffman, Alveena Taira DO Entered By: Zandra AbtsLynch, Shatara on 09/05/2020 15:32:58

## 2020-09-05 NOTE — Progress Notes (Signed)
TREANA, Fowler ([id number]) Visit Report for 09/05/2020 Arrival Information Details Patient Name: Date of Service: [name]. 09/05/2020 12:45 PM Medical Record Number: [id number] Patient Account Number: [id number] Date of Birth/Sex: Treating RN: [birth year] (86 y.o. [name] Primary Care Alayzia Pavlock: Odetta Pink Other Clinician: Referring Kitt Ledet: Treating Lendell Gallick/Extender: Ebony Cargo in Treatment: 1 Visit Information History Since Last Visit Added or deleted any medications: No Patient Arrived: Ambulatory Any new allergies or adverse reactions: No Arrival Time: 12:49 Had a fall or experienced change in No Transfer Assistance: None activities of daily living that may affect Patient Identification Verified: Yes risk of falls: Secondary Verification Process Completed: Yes Signs or symptoms of abuse/neglect since last visito No Patient Requires Transmission-Based Precautions: No Hospitalized since last visit: No Patient Has Alerts: No Implantable device outside of the clinic excluding No cellular tissue based products placed in the center since last visit: Has Dressing in Place as Prescribed: Yes Has Compression in Place as Prescribed: Yes Pain Present Now: No Electronic Signature(s) Signed: 09/05/2020 5:42:55 PM By: Antonieta Iba Entered By: Antonieta Iba on 09/05/2020 12:51:11 -------------------------------------------------------------------------------- Clinic Level of Care Assessment Details Patient Name: Date of Service: [name], [name] 09/05/2020 12:45 PM Medical Record Number: [id number] Patient Account Number: [id number] Date of Birth/Sex: Treating RN: [birth year] (86 y.o. [name] Primary Care Neera Teng: Odetta Pink Other Clinician: Referring Shyquan Stallbaumer: Treating Lucielle Vokes/Extender: Ebony Cargo in Treatment: 1 Clinic Level of Care Assessment Items TOOL 4 Quantity Score X- 1 0 Use when only an  EandM is performed on FOLLOW-UP visit ASSESSMENTS - Nursing Assessment / Reassessment X- 1 10 Reassessment of Co-morbidities (includes updates in patient status) X- 1 5 Reassessment of Adherence to Treatment Plan ASSESSMENTS - Wound and Skin A ssessment / Reassessment X - Simple Wound Assessment / Reassessment - one wound 1 5 []  - 0 Complex Wound Assessment / Reassessment - multiple wounds []  - 0 Dermatologic / Skin Assessment (not related to wound area) ASSESSMENTS - Focused Assessment []  - 0 Circumferential Edema Measurements - multi extremities []  - 0 Nutritional Assessment / Counseling / Intervention X- 1 5 Lower Extremity Assessment (monofilament, tuning fork, pulses) []  - 0 Peripheral Arterial Disease Assessment (using hand held doppler) ASSESSMENTS - Ostomy and/or Continence Assessment and Care []  - 0 Incontinence Assessment and Management []  - 0 Ostomy Care Assessment and Management (repouching, etc.) PROCESS - Coordination of Care X - Simple Patient / Family Education for ongoing care 1 15 []  - 0 Complex (extensive) Patient / Family Education for ongoing care X- 1 10 Staff obtains , Records, T Results / Process Orders est []  - 0 Staff telephones HHA, Nursing Homes / Clarify orders / etc []  - 0 Routine Transfer to another Facility (non-emergent condition) []  - 0 Routine Hospital Admission (non-emergent condition) []  - 0 New Admissions / / Ordering NPWT Apligraf, etc. , []  - 0 Emergency Hospital Admission (emergent condition) X- 1 10 Simple Discharge Coordination []  - 0 Complex (extensive) Discharge Coordination PROCESS - Special Needs []  - 0 Pediatric / Minor Patient Management []  - 0 Isolation Patient Management []  - 0 Hearing / Language / Visual special needs []  - 0 Assessment of Community assistance (transportation, D/C planning, etc.) []  - 0 Additional assistance / Altered mentation []  - 0 Support Surface(s)  Assessment (bed, cushion, seat, etc.) INTERVENTIONS - Wound Cleansing / Measurement X - Simple Wound Cleansing - one wound 1 5 []  - 0 Complex Wound Cleansing -  multiple wounds X- 1 5 Wound Imaging (photographs - any number of wounds) []  - 0 Wound Tracing (instead of photographs) X- 1 5 Simple Wound Measurement - one wound []  - 0 Complex Wound Measurement - multiple wounds INTERVENTIONS - Wound Dressings []  - 0 Small Wound Dressing one or multiple wounds []  - 0 Medium Wound Dressing one or multiple wounds []  - 0 Large Wound Dressing one or multiple wounds []  - 0 Application of Medications - topical []  - 0 Application of Medications - injection INTERVENTIONS - Miscellaneous []  - 0 External ear exam []  - 0 Specimen Collection (cultures, biopsies, blood, body fluids, etc.) []  - 0 Specimen(s) / Culture(s) sent or taken to Lab for analysis []  - 0 Patient Transfer (multiple staff / / Similar devices) []  - 0 Simple Staple / Suture removal (25 or less) []  - 0 Complex Staple / Suture removal (26 or more) []  - 0 Hypo / Hyperglycemic Management (close monitor of Blood Glucose) []  - 0 Ankle / Brachial Index (ABI) - do not check if billed separately X- 1 5 Vital Signs Has the patient been seen at the hospital within the last three years: Yes Total Score: 80 Level Of Care: New/Established - Level 3 Electronic Signature(s) Signed: 09/05/2020 6:15:37 PM By: RN, BSN Entered By: on 09/05/2020 15:32:51 -------------------------------------------------------------------------------- Encounter Discharge Information Details Patient Name: Date of Service: . 09/05/2020 12:45 PM Medical Record Number: Patient Account Number: Date of Birth/Sex: Treating RN: [name] (86 y.o. Nurse, adult Primary Care Miyuki Rzasa: Other Clinician: Referring Alora Gorey: Treating Sian Joles/Extender: in Treatment: 1 Encounter Discharge Information Items Discharge Condition: Stable Ambulatory Status: Ambulatory Discharge Destination: Home Transportation: Private Auto Accompanied By: alone Schedule Follow-up Appointment: Yes Clinical Summary of Care: Patient Declined Electronic Signature(s) Signed: 09/05/2020 6:15:37 PM By: RN, BSN Entered By: 11/05/2020 on 09/05/2020 15:33:20 -------------------------------------------------------------------------------- Lower Extremity Assessment Details Patient Name: Date of Service: Zandra Abts. 09/05/2020 12:45 PM Medical Record Number: Annia Belt Patient Account Number: 11/05/2020 Date of Birth/Sex: Treating RN: [birth year] (86 y.o. 2/2/Fowler Primary Care Sibyl Mikula: 97 Other Clinician: Referring Torin Whisner: Treating Khy Pitre/Extender: [name] in Treatment: 1 Edema Assessment Assessed: Odetta Pink: Yes] Ebony Cargo: No] Edema: [Left: Ye] [Right: s] Calf Left: Right: Point of Measurement: 28 cm From Medial Instep 38.5 cm Ankle Left: Right: Point of Measurement: 7 cm From Medial Instep 28.5 cm Vascular Assessment Pulses: Dorsalis Pedis Palpable: [Left:Yes] Electronic Signature(s) Signed: 09/05/2020 5:42:55 PM By: Zandra Abts Entered By: Zandra Abts on 09/05/2020 12:56:43 -------------------------------------------------------------------------------- Multi Wound Chart Details Patient Name: Date of Service: [name] 09/05/2020 12:45 PM Medical Record Number: [id number] Patient Account Number: [id number] Date of Birth/Sex: Treating RN: [birth year] (86 y.o. [name], Odetta Pink Primary Care Ryliee Figge: Ebony Cargo Other Clinician: Referring Vance Belcourt: Treating Damiano Stamper/Extender: Kyra Searles in Treatment: 1 Vital Signs Height(in): 62 Pulse(bpm): 78 Weight(lbs): 174 Blood Pressure(mmHg): 125/70 Body Mass Index(BMI):  32 Temperature(F): 98.7 Respiratory Rate(breaths/min): 18 Photos: [1:No Photos Left, Posterior Lower Leg] [N/A:N/A N/A] Wound Location: [1:Blister] [N/A:N/A] Wounding Event: [1:Venous Leg Ulcer] [N/A:N/A] Primary Etiology: [1:Cataracts, Congestive Heart Failure,] [N/A:N/A] Comorbid History: [1:Coronary Artery Disease, Hypertension, Type II Diabetes, Osteoarthritis 07/22/2020] [N/A:N/A] Date Acquired: [1:1] [N/A:N/A] Weeks of Treatment: [1:Healed - Epithelialized] [N/A:N/A] Wound Status: [1:Yes] [N/A:N/A] Clustered Wound: [1:0x0x0] [N/A:N/A] Measurements L x W x D (cm) [1:0] [N/A:N/A] A (cm) : rea [1:0] [N/A:N/A] Volume (cm) : [  1:100.00%] [N/A:N/A] % Reduction in Area: [1:100.00%] [N/A:N/A] % Reduction in Volume: [1:Full Thickness Without Exposed] [N/A:N/A] Classification: [1:Support Structures] Treatment Notes Electronic Signature(s) Signed: 09/05/2020 1:24:01 PM By: Geralyn Corwin DO Signed: 09/05/2020 6:02:04 PM By: Shawn Stall Entered By: Geralyn Corwin on 09/05/2020 13:11:36 -------------------------------------------------------------------------------- Multi-Disciplinary Care Plan Details Patient Name: Date of Service: [name] 09/05/2020 12:45 PM Medical Record Number: [id number] Patient Account Number: [id number] Date of Birth/Sex: Treating RN: [birth year] Fowler (86 y.o. [name] Primary Care Roc Streett: Other Clinician: Odetta Pink Referring Sylvan Sookdeo: Treating Kynlei Piontek/Extender: Ebony Cargo in Treatment: 1 Multidisciplinary Care Plan reviewed with physician Active Inactive Electronic Signature(s) Signed: 09/05/2020 6:15:37 PM By: Zandra Abts RN, BSN Entered By: Zandra Abts on 09/05/2020 15:29:21 -------------------------------------------------------------------------------- Pain Assessment Details Patient Name: Date of Service: [name] 09/05/2020 12:45 PM Medical Record Number: [id number] Patient Account  Number: [id number] Date of Birth/Sex: Treating RN: [birth year] (86 y.o. [name] Primary Care Hadja Harral: Odetta Pink Other Clinician: Referring Iyona Pehrson: Treating Petrita Blunck/Extender: Ebony Cargo in Treatment: 1 Active Problems Location of Pain Severity and Description of Pain Patient Has Paino No Site Locations Pain Management and Medication Current Pain Management: Electronic Signature(s) Signed: 09/05/2020 5:42:55 PM By: Antonieta Iba Entered By: Antonieta Iba on 09/05/2020 12:53:14 -------------------------------------------------------------------------------- Patient/Caregiver Education Details Patient Name: Date of Service: Annia Belt 6/9/2022andnbsp12:45 PM Medical Record Number: [id number] Patient Account Number: [id number] Date of Birth/Gender: Treating RN: [birth year]owler (86 y.o. [name] Primary Care Physician: Odetta Pink Other Clinician: Referring Physician: Treating Physician/Extender: Ebony Cargo in Treatment: 1 Education Assessment Education Provided To: Patient Education Topics Provided Wound/Skin Impairment: Methods: Explain/Verbal Responses: State content correctly Electronic Signature(s) Signed: 09/05/2020 6:15:37 PM By: Zandra Abts RN, BSN Entered By: Zandra Abts on 09/05/2020 15:29:32 -------------------------------------------------------------------------------- Wound Assessment Details Patient Name: Date of Service: [name] 09/05/2020 12:45 PM Medical Record Number: [id number] Patient Account Number: [id number] Date of Birth/Sex: Treating RN: [birth year] (86 y.o. [name] Primary Care Fransisca Shawn: Odetta Pink Other Clinician: Referring Jarely Juncaj: Treating Omer Puccinelli/Extender: Ebony Cargo in Treatment: 1 Wound Status Wound Number: 1 Primary Venous Leg Ulcer Etiology: Wound Location: Left, Posterior Lower Leg Wound Healed -  Epithelialized Wounding Event: Blister Status: Date Acquired: 07/22/2020 Comorbid Cataracts, Congestive Heart Failure, Coronary Artery Disease, Weeks Of Treatment: 1 History: Hypertension, Type II Diabetes, Osteoarthritis Clustered Wound: Yes Wound Measurements Length: (cm) Width: (cm) Depth: (cm) Area: (cm) Volume: (cm) 0 % Reduction in Area: 100% 0 % Reduction in Volume: 100% 0 0 0 Wound Description Classification: Full Thickness Without Exposed Support Structur es Electronic Signature(s) Signed: 09/05/2020 5:42:55 PM By: Antonieta Iba Entered By: Antonieta Iba on 09/05/2020 13:02:12 -------------------------------------------------------------------------------- Vitals Details Patient Name: Date of Service: [name] 09/05/2020 12:45 PM Medical Record Number: [id number] Patient Account Number: [id number] Date of Birth/Sex: Treating RN: [birth year] (86 y.o. [name] Primary Care Booker Bhatnagar: Odetta Pink Other Clinician: Referring Bess Saltzman: Treating Kaevon Cotta/Extender: Ebony Cargo in Treatment: 1 Vital Signs Time Taken: 12:51 Temperature (F): 98.7 Height (in): 62 Pulse (bpm): 78 Weight (lbs): 174 Respiratory Rate (breaths/min): 18 Body Mass Index (BMI): 31.8 Blood Pressure (mmHg): 125/70 Reference Range: 80 - 120 mg / dl Electronic Signature(s) Signed: 09/05/2020 5:42:55 PM By: Antonieta Iba Entered By: Antonieta Iba on 09/05/2020 12:53:06

## 2020-09-23 ENCOUNTER — Encounter (HOSPITAL_BASED_OUTPATIENT_CLINIC_OR_DEPARTMENT_OTHER): Payer: Medicare (Managed Care) | Admitting: Internal Medicine

## 2020-09-23 ENCOUNTER — Other Ambulatory Visit: Payer: Self-pay

## 2020-09-23 DIAGNOSIS — E11622 Type 2 diabetes mellitus with other skin ulcer: Secondary | ICD-10-CM | POA: Diagnosis not present

## 2020-09-23 DIAGNOSIS — L97829 Non-pressure chronic ulcer of other part of left lower leg with unspecified severity: Secondary | ICD-10-CM | POA: Diagnosis not present

## 2020-09-23 DIAGNOSIS — I87323 Chronic venous hypertension (idiopathic) with inflammation of bilateral lower extremity: Secondary | ICD-10-CM | POA: Diagnosis not present

## 2020-09-23 DIAGNOSIS — E119 Type 2 diabetes mellitus without complications: Secondary | ICD-10-CM

## 2020-09-23 DIAGNOSIS — I5023 Acute on chronic systolic (congestive) heart failure: Secondary | ICD-10-CM

## 2020-09-23 NOTE — Progress Notes (Addendum)
[name], [name] ([id number]) Visit Report for 09/23/2020 Chief Complaint Document Details Patient Name: Date of Service: [name], [name]. 09/23/2020 2:00 PM Medical Record Number: [id number] Patient Account Number: [id number] Date of Birth/Sex: [name]: 09-18-1934 (86 y.o. [name] Primary Care Provider: Odetta Pink Other Clinician: Referring Provider: Treating Provider/Extender: Ebony Cargo in Treatment: 3 Information Obtained from: Patient Chief Complaint Counseled on obtaining juxta lite for the right lower extremity Electronic Signature(s) Signed: 09/23/2020 3:28:30 PM By: [name] Entered By: Maria Corwin on 09/23/2020 15:24:26 -------------------------------------------------------------------------------- HPI Details Patient Name: Date of Service: [name]. 09/23/2020 2:00 PM Medical Record Number: [id number] Patient Account Number: [id number] Date of Birth/Sex: [name]: January 20, 1935 (86 y.o. [name] Primary Care Provider: Odetta Pink Other Clinician: Referring Provider: Treating Provider/Extender: Ebony Cargo in Treatment: 3 History of Present Illness HPI Description: Admission 6/2 [name] is an 86 year old female with a past medical history of type 2 diabetes on oral agents, chronic diastolic heart failure and venous stasis insufficiency that presents to the clinic for bilateral lower extremity swelling and open wounds to the left lower extremity. She states that 2 months ago she was on her feet for long periods of time walking since she did not have a car. Her legs became more swollen and developed wounds to her legs bilaterally. She states that over time her right lower extremity wounds healed however she continues to have some wounds to her left leg although much improved. She is wearing some mild compression to the right leg. She has been having her left leg wrapped by  her primary care physician's office 3 times a week with Kerlix/Coban. She states she finished her antibiotics that were prescribed there as well for cellulitis. She currently denies signs of infection. 6/9; patient presents for 1 week follow-up. She has been using the 3 layer compression wrap and tolerating this well. She denies signs of infection. She received the compression wrap for the left leg in the mail and has brought this in today. Consult 6/27 Patient presents because her primary care physician would like for her to obtain a juxta light compression for the right lower extremity. There are no open wounds to this leg. She was recently treated for an a venous stasis ulcer to the left lower extremity and has been using the juxta lite compression. Electronic Signature(s) Signed: 09/23/2020 3:28:30 PM By: [name] Entered By: Maria Corwin on 09/23/2020 15:25:10 -------------------------------------------------------------------------------- Physical Exam Details Patient Name: Date of Service: [place], [name]. 09/23/2020 2:00 PM Medical Record Number: [id number] Patient Account Number: [id number] Date of Birth/Sex: [name]: Aug 30, 1934 (86 y.o. [name] Primary Care Provider: Odetta Pink Other Clinician: Referring Provider: Treating Provider/Extender: Ebony Cargo in Treatment: 3 Constitutional respirations regular, non-labored and within target range for patient.. Cardiovascular 2+ dorsalis pedis/posterior tibialis pulses. Psychiatric pleasant and cooperative. Notes Right lower extremity with no wounds present. 2+ pitting edema to the knee Electronic Signature(s) Signed: 09/23/2020 3:28:30 PM By: [name] Entered By: Maria Corwin on 09/23/2020 15:26:03 -------------------------------------------------------------------------------- Physician Orders Details Patient Name: Date of Service: [name]. 09/23/2020  2:00 PM Medical Record Number: [id number] Patient Account Number: [id number] Date of Birth/Sex: [name]: [birth year] (86 y.o. [name] Primary Care Provider: Odetta Pink Other Clinician: Referring Provider: Treating Provider/Extender: Ebony Cargo in Treatment: 3 Verbal / Phone Orders: No Diagnosis Coding ICD-10 Coding Code Description [phone]  Chronic venous hypertension (idiopathic) with inflammation of bilateral lower extremity E11.9 Type 2 diabetes mellitus without complications I50.32 Chronic diastolic (congestive) heart failure Follow-up Appointments Other: - Consult Visit Today, No follow up needed unless you develop a wound. Edema Control - Lymphedema / SCD / Other Elevate legs to the level of the heart or above for 30 minutes daily and/or when sitting, a frequency of: - throughout the day Avoid standing for long periods of time. Exercise regularly Compression stocking or Garment 20-30 mm/Hg pressure to: - Juxtalite to left leg, compression stocking to right leg. Electronic Signature(s) Signed: 09/23/2020 3:28:30 PM By: [name] Previous Signature: 09/23/2020 1:55:28 PM Version By: Antonieta Iba Entered By: Maria Corwin on 09/23/2020 15:26:21 -------------------------------------------------------------------------------- Problem List Details Patient Name: Date of Service: [name]. 09/23/2020 2:00 PM Medical Record Number: [id number] Patient Account Number: [id number] Date of Birth/Sex: [name]: [birth year] (87 y.o. [name] Primary Care Provider: Odetta Pink Other Clinician: Referring Provider: Treating Provider/Extender: Ebony Cargo in Treatment: 3 Active Problems ICD-10 Encounter Code Description Active Date MDM Diagnosis I87.323 Chronic venous hypertension (idiopathic) with inflammation of bilateral lower 08/29/2020 No Yes extremity E11.9 Type 2 diabetes mellitus  without complications 08/29/2020 No Yes I50.32 Chronic diastolic (congestive) heart failure 08/29/2020 No Yes Inactive Problems Resolved Problems ICD-10 Code Description Active Date Resolved Date L97.829 Non-pressure chronic ulcer of other part of left lower leg with unspecified severity 08/29/2020 08/29/2020 Electronic Signature(s) Signed: 09/23/2020 3:28:30 PM By: [name] Previous Signature: 09/23/2020 1:54:53 PM Version By: Antonieta Iba Entered By: Maria Corwin on 09/23/2020 15:24:06 -------------------------------------------------------------------------------- Progress Note Details Patient Name: Date of Service: [name]. 09/23/2020 2:00 PM Medical Record Number: [id number] Patient Account Number: [id number] Date of Birth/Sex: [name]: June 04, 1934 (86 y.o. [name] Primary Care Provider: Odetta Pink Other Clinician: Referring Provider: Treating Provider/Extender: Ebony Cargo in Treatment: 3 Subjective Chief Complaint Information obtained from Patient Counseled on obtaining juxta lite for the right lower extremity History of Present Illness (HPI) Admission 6/2 [name] is an 85 year old female with a past medical history of type 2 diabetes on oral agents, chronic diastolic heart failure and venous stasis insufficiency that presents to the clinic for bilateral lower extremity swelling and open wounds to the left lower extremity. She states that 2 months ago she was on her feet for long periods of time walking since she did not have a car. Her legs became more swollen and developed wounds to her legs bilaterally. She states that over time her right lower extremity wounds healed however she continues to have some wounds to her left leg although much improved. She is wearing some mild compression to the right leg. She has been having her left leg wrapped by her primary care physician's office 3 times a week  with Kerlix/Coban. She states she finished her antibiotics that were prescribed there as well for cellulitis. She currently denies signs of infection. 6/9; patient presents for 1 week follow-up. She has been using the 3 layer compression wrap and tolerating this well. She denies signs of infection. She received the compression wrap for the left leg in the mail and has brought this in today. Consult 6/27 Patient presents because her primary care physician would like for her to obtain a juxta light compression for the right lower extremity. There are no open wounds to this leg. She was recently treated for an a venous stasis ulcer to the left lower extremity  and has been using the juxta lite compression. Patient History Information obtained from Patient. Family History Cancer - Child, Diabetes - Child,Mother,Father, Lung Disease - Child, Seizures - Child, No family history of Heart Disease, Hereditary Spherocytosis, Hypertension, Kidney Disease, Stroke, Thyroid Problems, Tuberculosis. Social History Never smoker, Marital Status - Widowed, Alcohol Use - Never, Drug Use - No History, Caffeine Use - Rarely. Medical History Eyes Patient has history of Cataracts - Surgical Removal Cardiovascular Patient has history of Congestive Heart Failure, Coronary Artery Disease, Hypertension Endocrine Patient has history of Type II Diabetes Musculoskeletal Patient has history of Osteoarthritis Medical A Surgical History Notes nd Endocrine thyroid nodule Objective Constitutional respirations regular, non-labored and within target range for patient.. Vitals Time Taken: 2:00 PM, Height: 62 in, Weight: 174 lbs, BMI: 31.8, Temperature: 98.8 F, Pulse: 84 bpm, Respiratory Rate: 20 breaths/min, Blood Pressure: 137/78 mmHg. Cardiovascular 2+ dorsalis pedis/posterior tibialis pulses. Psychiatric pleasant and cooperative. General Notes: Right lower extremity with no wounds present. 2+ pitting edema to the  knee Assessment Active Problems ICD-10 Chronic venous hypertension (idiopathic) with inflammation of bilateral lower extremity Type 2 diabetes mellitus without complications Chronic diastolic (congestive) heart failure Patient presents for discussion of juxta light to use on the right lower extremity. Patient received 1 to the left lower extremity due to an open wound and insurance covered this. At this time she would need to pay out-of-pocket and we offered her information to obtain this. However, she does not want to pay the cost of the juxta lite and would rather use her compression sock. I told her to call us if she does develop a wound and we can assess her for juxta light at that time. Plan Follow-up Appointments: Other: - Consult Visit Today, No follow up needed unless you develop a wound. Edema Control - Lymphedema / SCD / Other: Elevate legs to the level of the heart or above for 30 minutes daily and/or when sitting, a frequency of: - throughout the day Avoid standing for long periods of time. Exercise regularly Compression stocking or Garment 20-30 mm/Hg pressure to: - Juxtalite to left leg, compression stocking to right leg. 1. Follow-up as needed Electronic Signature(s) Signed: 09/23/2020 3:28:30 PM By: Maria CorwinHoffman, Angalena Cousineau Fowler Entered By: Maria CorwinHoffman, Ashmi Blas on 09/23/2020 15:27:56 -------------------------------------------------------------------------------- HxROS Details Patient Name: Date of Service: [name], [name] 09/23/2020 2:00 PM Medical Record Number: [id number] Patient Account Number: [id number] Date of Birth/Sex: [name]: 1934/06/24 (86 y.o. Maria CluckF) Barnhart, Jodi Primary Care Provider: Odetta Pinkichardson, Derek Other Clinician: Referring Provider: Treating Provider/Extender: Ebony CargoHoffman, Karl Erway Smith, Fred Weeks in Treatment: 3 Information Obtained From Patient Eyes Medical History: Positive for: Cataracts - Surgical Removal Cardiovascular Medical History: Positive for:  Congestive Heart Failure; Coronary Artery Disease; Hypertension Endocrine Medical History: Positive for: Type II Diabetes Past Medical History Notes: thyroid nodule Time with diabetes: 10+ years Treated with: Oral agents Blood sugar tested every day: No Musculoskeletal Medical History: Positive for: Osteoarthritis HBO Extended History Items Eyes: Cataracts Immunizations Pneumococcal Vaccine: Received Pneumococcal Vaccination: No Implantable Devices None Family and Social History Cancer: Yes - Child; Diabetes: Yes - Child,Mother,Father; Heart Disease: No; Hereditary Spherocytosis: No; Hypertension: No; Kidney Disease: No; Lung Disease: Yes - Child; Seizures: Yes - Child; Stroke: No; Thyroid Problems: No; Tuberculosis: No; Never smoker; Marital Status - Widowed; Alcohol Use: Never; Drug Use: No History; Caffeine Use: Rarely; Financial Concerns: No; Food, Clothing or Shelter Needs: No; Support System Lacking: No; Transportation Concerns: No Electronic Signature(s) Signed: 09/23/2020 3:28:30 PM By: Maria CorwinHoffman, Colson Barco Fowler Signed:  09/23/2020 5:08:20 PM By: Antonieta Iba Entered By: Maria Corwin on 09/23/2020 15:25:17 -------------------------------------------------------------------------------- SuperBill Details Patient Name: Date of Service: [name]. 09/23/2020 Medical Record Number: [id number] Patient Account Number: [id number] Date of Birth/Sex: [name]: [birth year] (86 y.o. [name] Primary Care Provider: Odetta Pink Other Clinician: Referring Provider: Treating Provider/Extender: Ebony Cargo in Treatment: 3 Diagnosis Coding ICD-10 Codes Code Description [phone] Chronic venous hypertension (idiopathic) with inflammation of bilateral lower extremity L97.829 Non-pressure chronic ulcer of other part of left lower leg with unspecified severity E11.9 Type 2 diabetes mellitus without complications I50.32 Chronic diastolic  (congestive) heart failure Facility Procedures CPT4 Code: [id number] Description: [phone] - WOUND CARE VISIT-LEV 2 EST PT Modifier: Quantity: 1 Physician Procedures : CPT4 Code Description Modifier [id number] - WC PHYS LEVEL 3 - EST PT ICD-10 Diagnosis Description I87.323 Chronic venous hypertension (idiopathic) with inflammation of bilateral lower extremity L97.829 Non-pressure chronic ulcer of other part of  left lower leg with unspecified severity E11.9 Type 2 diabetes mellitus without complications I50.32 Chronic diastolic (congestive) heart failure Quantity: 1 Electronic Signature(s) Signed: 09/23/2020 3:28:30 PM By: [name] Entered By: Maria Corwin on 09/23/2020 15:28:09

## 2020-10-07 ENCOUNTER — Other Ambulatory Visit (HOSPITAL_COMMUNITY): Payer: Self-pay | Admitting: Family

## 2020-10-08 ENCOUNTER — Other Ambulatory Visit (HOSPITAL_COMMUNITY): Payer: Self-pay | Admitting: Family

## 2020-10-08 ENCOUNTER — Other Ambulatory Visit: Payer: Self-pay | Admitting: Family

## 2020-10-08 DIAGNOSIS — E041 Nontoxic single thyroid nodule: Secondary | ICD-10-CM

## 2020-10-17 NOTE — Progress Notes (Signed)
JADEYN, HARGETT ([id number]) Visit Report for 09/23/2020 Arrival Information Details Patient Name: Date of Service: [name], [name]. 09/23/2020 2:00 PM Medical Record Number: [id number] Patient Account Number: [id number] Date of Birth/Sex: Treating RN: 1934-10-19 (86 y.o. Debara Pickett, Millard.[place]oa Primary Care Rylan Kaufmann: Odetta Pink Other Clinician: Referring Evaleen Sant: Treating Tamera Pingley/Extender: Ebony Cargo in Treatment: 3 Visit Information History Since Last Visit All ordered tests and consults were completed: No Patient Arrived: Dan Humphreys Added or deleted any medications: No Arrival Time: 13:59 Any new allergies or adverse reactions: No Accompanied By: alone Had a fall or experienced change in No Transfer Assistance: None activities of daily living that may affect Patient Identification Verified: Yes risk of falls: Secondary Verification Process Completed: Yes Signs or symptoms of abuse/neglect since last visito No Patient Requires Transmission-Based Precautions: No Hospitalized since last visit: No Patient Has Alerts: No Implantable device outside of the clinic excluding No cellular tissue based products placed in the center since last visit: Pain Present Now: No Electronic Signature(s) Signed: 10/16/2020 8:30:02 PM By: Shawn Stall Previous Signature: 10/14/2020 9:03:49 PM Version By: Shawn Stall Entered By: Shawn Stall on 10/16/2020 20:29:23 -------------------------------------------------------------------------------- Clinic Level of Care Assessment Details Patient Name: Date of Service: Schiefelbein, Truddie Hidden. 09/23/2020 2:00 PM Medical Record Number: [id number] Patient Account Number: [id number] Date of Birth/Sex: Treating RN: 11-01-1934 (86 y.o. [name] Primary Care Shawnita Krizek: Odetta Pink Other Clinician: Referring Abron Neddo: Treating Elayah Klooster/Extender: Ebony Cargo in Treatment: 3 Clinic Level of Care Assessment  Items TOOL 4 Quantity Score X- 1 0 Use when only an EandM is performed on FOLLOW-UP visit ASSESSMENTS - Nursing Assessment / Reassessment X- 1 10 Reassessment of Co-morbidities (includes updates in patient status) X- 1 5 Reassessment of Adherence to Treatment Plan ASSESSMENTS - Wound and Skin A ssessment / Reassessment []  - 0 Simple Wound Assessment / Reassessment - one wound []  - 0 Complex Wound Assessment / Reassessment - multiple wounds []  - 0 Dermatologic / Skin Assessment (not related to wound area) ASSESSMENTS - Focused Assessment []  - 0 Circumferential Edema Measurements - multi extremities []  - 0 Nutritional Assessment / Counseling / Intervention []  - 0 Lower Extremity Assessment (monofilament, tuning fork, pulses) []  - 0 Peripheral Arterial Disease Assessment (using hand held doppler) ASSESSMENTS - Ostomy and/or Continence Assessment and Care []  - 0 Incontinence Assessment and Management []  - 0 Ostomy Care Assessment and Management (repouching, etc.) PROCESS - Coordination of Care []  - 0 Simple Patient / Family Education for ongoing care X- 1 20 Complex (extensive) Patient / Family Education for ongoing care []  - 0 Staff obtains , Records, T Results / Process Orders est []  - 0 Staff telephones HHA, Nursing Homes / Clarify orders / etc []  - 0 Routine Transfer to another Facility (non-emergent condition) []  - 0 Routine Hospital Admission (non-emergent condition) []  - 0 New Admissions / / Ordering NPWT Apligraf, etc. , []  - 0 Emergency Hospital Admission (emergent condition) []  - 0 Simple Discharge Coordination []  - 0 Complex (extensive) Discharge Coordination PROCESS - Special Needs []  - 0 Pediatric / Minor Patient Management []  - 0 Isolation Patient Management []  - 0 Hearing / Language / Visual special needs []  - 0 Assessment of Community assistance (transportation, D/C planning, etc.) []  - 0 Additional assistance  / Altered mentation []  - 0 Support Surface(s) Assessment (bed, cushion, seat, etc.) INTERVENTIONS - Wound Cleansing / Measurement []  - 0 Simple Wound Cleansing - one wound []  - 0 Complex  Wound Cleansing - multiple wounds []  - 0 Wound Imaging (photographs - any number of wounds) []  - 0 Wound Tracing (instead of photographs) []  - 0 Simple Wound Measurement - one wound []  - 0 Complex Wound Measurement - multiple wounds INTERVENTIONS - Wound Dressings []  - 0 Small Wound Dressing one or multiple wounds []  - 0 Medium Wound Dressing one or multiple wounds []  - 0 Large Wound Dressing one or multiple wounds []  - 0 Application of Medications - topical []  - 0 Application of Medications - injection INTERVENTIONS - Miscellaneous []  - 0 External ear exam []  - 0 Specimen Collection (cultures, biopsies, blood, body fluids, etc.) []  - 0 Specimen(s) / Culture(s) sent or taken to Lab for analysis []  - 0 Patient Transfer (multiple staff / / Similar devices) []  - 0 Simple Staple / Suture removal (25 or less) []  - 0 Complex Staple / Suture removal (26 or more) []  - 0 Hypo / Hyperglycemic Management (close monitor of Blood Glucose) []  - 0 Ankle / Brachial Index (ABI) - do not check if billed separately X- 1 5 Vital Signs Has the patient been seen at the hospital within the last three years: Yes Total Score: 40 Level Of Care: New/Established - Level 2 Electronic Signature(s) Signed: 09/23/2020 5:08:20 PM By: Entered By: on 09/23/2020 14:59:13 -------------------------------------------------------------------------------- Encounter Discharge Information Details Patient Name: Date of Service: . 09/23/2020 2:00 PM Medical Record Number: Patient Account Number: Date of Birth/Sex: Treating RN: [birth year] (86 y.o. Primary Care Denyla Cortese: Other Clinician: Referring  Nitasha Jewel: Treating Brandilyn Nanninga/Extender: Nurse, adult in Treatment: 3 Encounter Discharge Information Items Discharge Condition: Stable Ambulatory Status: Walker Discharge Destination: Home Transportation: Other Schedule Follow-up Appointment: No Clinical Summary of Care: Provided on 10/21/2020 Form Type Recipient Paper Patient Patient Electronic Signature(s) Signed: 09/23/2020 3:18:51 PM By: Entered By: on 09/23/2020 15:18:51 -------------------------------------------------------------------------------- Lower Extremity Assessment Details Patient Name: Date of Service: Antonieta Iba. 09/23/2020 2:00 PM Medical Record Number: 09/25/2020 Patient Account Number: Annia Belt Date of Birth/Sex: Treating RN: 05-02-1934 (86 y.o. [id number], 05/01/1934 Primary Care Quantae Martel: 97 Other Clinician: Referring Cierria Height: Treating Faizan Geraci/Extender: [name] in Treatment: 3 Edema Assessment Assessed: Odetta Pink: No] [Right: No] Edema: [Left: Ye] [Right: s] Calf Left: Right: Point of Measurement: 28 cm From Medial Instep 40.4 cm Ankle Left: Right: Point of Measurement: 9 cm From Medial Instep 28.5 cm Vascular Assessment Pulses: Dorsalis Pedis Palpable: [Left:Yes] Electronic Signature(s) Signed: 10/16/2020 8:30:02 PM By: 10/23/2020 Previous Signature: 10/14/2020 9:03:49 PM Version By: Antonieta Iba Entered By: Antonieta Iba on 10/16/2020 20:29:42 -------------------------------------------------------------------------------- Multi Wound Chart Details Patient Name: Date of Service: [name] 09/23/2020 2:00 PM Medical Record Number: [id number] Patient Account Number: [id number] Date of Birth/Sex: Treating RN: 03-17-1935 (86 y.o. Debara Pickett Primary Care Natalia Wittmeyer: Yvonne Kendall Other Clinician: Referring Rudolpho Claxton: Treating Fredis Malkiewicz/Extender: Odetta Pink in  Treatment: 3 Vital Signs Height(in): 62 Pulse(bpm): 84 Weight(lbs): 174 Blood Pressure(mmHg): 137/78 Body Mass Index(BMI): 32 Temperature(F): 98.8 Respiratory Rate(breaths/min): 20 Wound Assessments Treatment Notes Electronic Signature(s) Signed: 09/23/2020 3:28:30 PM By: Kyra Searles DO Signed: 09/23/2020 5:08:20 PM By: Shawn Stall Entered By: 10/16/2020 on 09/23/2020 15:24:10 -------------------------------------------------------------------------------- Pain Assessment Details Patient Name: Date of Service: Shawn Stall. 09/23/2020 2:00 PM Medical Record Number: Annia Belt Patient Account Number: 09/25/2020 Date of Birth/Sex: Treating RN: 12-10-1934 (86 y.o. F) [place], [place]  Primary Care Perel Hauschild: Odetta Pink Other Clinician: Referring Anaelle Dunton: Treating Prayan Ulin/Extender: Ebony Cargo in Treatment: 3 Active Problems Location of Pain Severity and Description of Pain Patient Has Paino No Site Locations Pain Management and Medication Current Pain Management: Electronic Signature(s) Signed: 10/16/2020 8:30:02 PM By: Shawn Stall Previous Signature: 10/14/2020 9:03:49 PM Version By: Shawn Stall Entered By: Shawn Stall on 10/16/2020 20:29:35 -------------------------------------------------------------------------------- Patient/Caregiver Education Details Patient Name: Date of Service: [place]ist[place], LO YE M. 6/27/2022andnbsp2:00 PM Medical Record Number: [id number] Patient Account Number: [id number] Date of Birth/Gender: Treating RN: [birth year] (86 y.o. [name] Primary Care Physician: Odetta Pink Other Clinician: Referring Physician: Treating Physician/Extender: Ebony Cargo in Treatment: 3 Education Assessment Education Provided To: Patient Education Topics Provided Venous: Methods: Explain/Verbal, Printed Responses: State content correctly Electronic Signature(s) Signed: 09/23/2020  5:08:20 PM By: Antonieta Iba Entered By: Antonieta Iba on 09/23/2020 14:58:03 -------------------------------------------------------------------------------- Vitals Details Patient Name: Date of Service: [name] 09/23/2020 2:00 PM Medical Record Number: [id number] Patient Account Number: [id number] Date of Birth/Sex: Treating RN: Oct 12, 1934 (86 y.o. Debara Pickett, Yvonne Kendall Primary Care Beatriz Settles: Odetta Pink Other Clinician: Referring Kristopher Delk: Treating Kaziyah Parkison/Extender: Ebony Cargo in Treatment: 3 Vital Signs Time Taken: 14:00 Temperature (F): 98.8 Height (in): 62 Pulse (bpm): 84 Weight (lbs): 174 Respiratory Rate (breaths/min): 20 Body Mass Index (BMI): 31.8 Blood Pressure (mmHg): 137/78 Reference Range: 80 - 120 mg / dl Electronic Signature(s) Signed: 10/16/2020 8:30:02 PM By: Shawn Stall Previous Signature: 10/14/2020 9:03:49 PM Version By: Shawn Stall Entered By: Shawn Stall on 10/16/2020 68:03:21

## 2021-05-28 DEATH — deceased

## 2022-05-05 ENCOUNTER — Emergency Department (HOSPITAL_COMMUNITY): Payer: Medicare HMO

## 2022-05-05 ENCOUNTER — Emergency Department (HOSPITAL_COMMUNITY): Payer: Medicare HMO | Admitting: Anesthesiology

## 2022-05-05 ENCOUNTER — Encounter (HOSPITAL_COMMUNITY)
Admission: EM | Disposition: A | Discharge status: Hospice | Payer: Self-pay | Source: Home / Self Care | Attending: Neurology

## 2022-05-05 ENCOUNTER — Inpatient Hospital Stay (HOSPITAL_COMMUNITY)
Admission: EM | Admit: 2022-05-05 | Discharge: 2022-05-17 | DRG: 023 | Disposition: A | Discharge status: Hospice | Payer: Medicare HMO | Attending: Neurology | Admitting: Neurology

## 2022-05-05 ENCOUNTER — Inpatient Hospital Stay (HOSPITAL_COMMUNITY): Payer: Medicare HMO

## 2022-05-05 DIAGNOSIS — Z515 Encounter for palliative care: Secondary | ICD-10-CM

## 2022-05-05 DIAGNOSIS — I350 Nonrheumatic aortic (valve) stenosis: Secondary | ICD-10-CM | POA: Diagnosis not present

## 2022-05-05 DIAGNOSIS — F0390 Unspecified dementia without behavioral disturbance: Secondary | ICD-10-CM | POA: Diagnosis present

## 2022-05-05 DIAGNOSIS — Z825 Family history of asthma and other chronic lower respiratory diseases: Secondary | ICD-10-CM

## 2022-05-05 DIAGNOSIS — I878 Other specified disorders of veins: Secondary | ICD-10-CM | POA: Diagnosis present

## 2022-05-05 DIAGNOSIS — J449 Chronic obstructive pulmonary disease, unspecified: Secondary | ICD-10-CM | POA: Diagnosis not present

## 2022-05-05 DIAGNOSIS — J988 Other specified respiratory disorders: Secondary | ICD-10-CM | POA: Diagnosis not present

## 2022-05-05 DIAGNOSIS — R29727 NIHSS score 27: Secondary | ICD-10-CM | POA: Diagnosis present

## 2022-05-05 DIAGNOSIS — G936 Cerebral edema: Secondary | ICD-10-CM | POA: Diagnosis present

## 2022-05-05 DIAGNOSIS — Z87440 Personal history of urinary (tract) infections: Secondary | ICD-10-CM

## 2022-05-05 DIAGNOSIS — J690 Pneumonitis due to inhalation of food and vomit: Secondary | ICD-10-CM | POA: Diagnosis not present

## 2022-05-05 DIAGNOSIS — J439 Emphysema, unspecified: Secondary | ICD-10-CM | POA: Diagnosis present

## 2022-05-05 DIAGNOSIS — Z1152 Encounter for screening for COVID-19: Secondary | ICD-10-CM | POA: Diagnosis not present

## 2022-05-05 DIAGNOSIS — I10 Essential (primary) hypertension: Secondary | ICD-10-CM | POA: Diagnosis not present

## 2022-05-05 DIAGNOSIS — I619 Nontraumatic intracerebral hemorrhage, unspecified: Secondary | ICD-10-CM | POA: Diagnosis not present

## 2022-05-05 DIAGNOSIS — J9589 Other postprocedural complications and disorders of respiratory system, not elsewhere classified: Secondary | ICD-10-CM

## 2022-05-05 DIAGNOSIS — Z7984 Long term (current) use of oral hypoglycemic drugs: Secondary | ICD-10-CM

## 2022-05-05 DIAGNOSIS — I5032 Chronic diastolic (congestive) heart failure: Secondary | ICD-10-CM | POA: Diagnosis not present

## 2022-05-05 DIAGNOSIS — R1310 Dysphagia, unspecified: Secondary | ICD-10-CM | POA: Diagnosis present

## 2022-05-05 DIAGNOSIS — Z7189 Other specified counseling: Secondary | ICD-10-CM | POA: Diagnosis not present

## 2022-05-05 DIAGNOSIS — Z713 Dietary counseling and surveillance: Secondary | ICD-10-CM

## 2022-05-05 DIAGNOSIS — I63412 Cerebral infarction due to embolism of left middle cerebral artery: Secondary | ICD-10-CM | POA: Diagnosis present

## 2022-05-05 DIAGNOSIS — Z809 Family history of malignant neoplasm, unspecified: Secondary | ICD-10-CM

## 2022-05-05 DIAGNOSIS — J9600 Acute respiratory failure, unspecified whether with hypoxia or hypercapnia: Secondary | ICD-10-CM | POA: Diagnosis not present

## 2022-05-05 DIAGNOSIS — I6602 Occlusion and stenosis of left middle cerebral artery: Secondary | ICD-10-CM | POA: Diagnosis present

## 2022-05-05 DIAGNOSIS — E039 Hypothyroidism, unspecified: Secondary | ICD-10-CM | POA: Diagnosis present

## 2022-05-05 DIAGNOSIS — Z96652 Presence of left artificial knee joint: Secondary | ICD-10-CM | POA: Diagnosis present

## 2022-05-05 DIAGNOSIS — R29810 Facial weakness: Secondary | ICD-10-CM | POA: Diagnosis present

## 2022-05-05 DIAGNOSIS — J9601 Acute respiratory failure with hypoxia: Secondary | ICD-10-CM | POA: Diagnosis not present

## 2022-05-05 DIAGNOSIS — G8191 Hemiplegia, unspecified affecting right dominant side: Secondary | ICD-10-CM | POA: Diagnosis not present

## 2022-05-05 DIAGNOSIS — I2510 Atherosclerotic heart disease of native coronary artery without angina pectoris: Secondary | ICD-10-CM | POA: Diagnosis present

## 2022-05-05 DIAGNOSIS — R4701 Aphasia: Secondary | ICD-10-CM | POA: Diagnosis present

## 2022-05-05 DIAGNOSIS — I959 Hypotension, unspecified: Secondary | ICD-10-CM | POA: Diagnosis not present

## 2022-05-05 DIAGNOSIS — I63512 Cerebral infarction due to unspecified occlusion or stenosis of left middle cerebral artery: Secondary | ICD-10-CM

## 2022-05-05 DIAGNOSIS — E785 Hyperlipidemia, unspecified: Secondary | ICD-10-CM | POA: Diagnosis present

## 2022-05-05 DIAGNOSIS — Z66 Do not resuscitate: Secondary | ICD-10-CM | POA: Diagnosis not present

## 2022-05-05 DIAGNOSIS — J9602 Acute respiratory failure with hypercapnia: Secondary | ICD-10-CM | POA: Diagnosis not present

## 2022-05-05 DIAGNOSIS — I639 Cerebral infarction, unspecified: Principal | ICD-10-CM | POA: Diagnosis present

## 2022-05-05 DIAGNOSIS — E669 Obesity, unspecified: Secondary | ICD-10-CM | POA: Diagnosis present

## 2022-05-05 DIAGNOSIS — I110 Hypertensive heart disease with heart failure: Secondary | ICD-10-CM | POA: Diagnosis present

## 2022-05-05 DIAGNOSIS — E875 Hyperkalemia: Secondary | ICD-10-CM | POA: Diagnosis present

## 2022-05-05 DIAGNOSIS — E11649 Type 2 diabetes mellitus with hypoglycemia without coma: Secondary | ICD-10-CM | POA: Diagnosis not present

## 2022-05-05 DIAGNOSIS — E1165 Type 2 diabetes mellitus with hyperglycemia: Secondary | ICD-10-CM | POA: Diagnosis present

## 2022-05-05 DIAGNOSIS — Z6833 Body mass index (BMI) 33.0-33.9, adult: Secondary | ICD-10-CM

## 2022-05-05 DIAGNOSIS — M79604 Pain in right leg: Secondary | ICD-10-CM | POA: Diagnosis present

## 2022-05-05 DIAGNOSIS — I6389 Other cerebral infarction: Secondary | ICD-10-CM | POA: Diagnosis not present

## 2022-05-05 DIAGNOSIS — Z79899 Other long term (current) drug therapy: Secondary | ICD-10-CM

## 2022-05-05 HISTORY — PX: IR ANGIO INTRA EXTRACRAN SEL INTERNAL CAROTID UNI L MOD SED: IMG5361

## 2022-05-05 HISTORY — PX: RADIOLOGY WITH ANESTHESIA: SHX6223

## 2022-05-05 HISTORY — PX: IR PERCUTANEOUS ART THROMBECTOMY/INFUSION INTRACRANIAL INC DIAG ANGIO: IMG6087

## 2022-05-05 HISTORY — PX: IR CT HEAD LTD: IMG2386

## 2022-05-05 LAB — POCT I-STAT 7, (LYTES, BLD GAS, ICA,H+H)
Acid-base deficit: 1 mmol/L (ref 0.0–2.0)
Bicarbonate: 25.1 mmol/L (ref 20.0–28.0)
Calcium, Ion: 1.14 mmol/L — ABNORMAL LOW (ref 1.15–1.40)
HCT: 38 % (ref 36.0–46.0)
Hemoglobin: 12.9 g/dL (ref 12.0–15.0)
O2 Saturation: 97 %
Patient temperature: 97.5
Potassium: 3.8 mmol/L (ref 3.5–5.1)
Sodium: 138 mmol/L (ref 135–145)
TCO2: 26 mmol/L (ref 22–32)
pCO2 arterial: 45.7 mmHg (ref 32–48)
pH, Arterial: 7.345 — ABNORMAL LOW (ref 7.35–7.45)
pO2, Arterial: 100 mmHg (ref 83–108)

## 2022-05-05 LAB — I-STAT CHEM 8, ED
BUN: 13 mg/dL (ref 8–23)
Calcium, Ion: 1.03 mmol/L — ABNORMAL LOW (ref 1.15–1.40)
Chloride: 102 mmol/L (ref 98–111)
Creatinine, Ser: 0.5 mg/dL (ref 0.44–1.00)
Glucose, Bld: 260 mg/dL — ABNORMAL HIGH (ref 70–99)
HCT: 39 % (ref 36.0–46.0)
Hemoglobin: 13.3 g/dL (ref 12.0–15.0)
Potassium: 5.3 mmol/L — ABNORMAL HIGH (ref 3.5–5.1)
Sodium: 138 mmol/L (ref 135–145)
TCO2: 29 mmol/L (ref 22–32)

## 2022-05-05 LAB — COMPREHENSIVE METABOLIC PANEL
ALT: 15 U/L (ref 0–44)
AST: 17 U/L (ref 15–41)
Albumin: 3.4 g/dL — ABNORMAL LOW (ref 3.5–5.0)
Alkaline Phosphatase: 57 U/L (ref 38–126)
Anion gap: 12 (ref 5–15)
BUN: 9 mg/dL (ref 8–23)
CO2: 25 mmol/L (ref 22–32)
Calcium: 8.6 mg/dL — ABNORMAL LOW (ref 8.9–10.3)
Chloride: 99 mmol/L (ref 98–111)
Creatinine, Ser: 0.59 mg/dL (ref 0.44–1.00)
GFR, Estimated: >60 mL/min (ref >60)
Glucose, Bld: 284 mg/dL — ABNORMAL HIGH (ref 70–99)
Potassium: 3.8 mmol/L (ref 3.5–5.1)
Sodium: 136 mmol/L (ref 135–145)
Total Bilirubin: 0.2 mg/dL — ABNORMAL LOW (ref 0.3–1.2)
Total Protein: 6.8 g/dL (ref 6.5–8.1)

## 2022-05-05 LAB — CBC
HCT: 39.7 % (ref 36.0–46.0)
Hemoglobin: 13.9 g/dL (ref 12.0–15.0)
MCH: 31.7 pg (ref 26.0–34.0)
MCHC: 35 g/dL (ref 30.0–36.0)
MCV: 90.6 fL (ref 80.0–100.0)
Platelets: 221 10*3/uL (ref 150–400)
RBC: 4.38 MIL/uL (ref 3.87–5.11)
RDW: 12.3 % (ref 11.5–15.5)
WBC: 5.5 10*3/uL (ref 4.0–10.5)
nRBC: 0 % (ref 0.0–0.2)

## 2022-05-05 LAB — RAPID URINE DRUG SCREEN, HOSP PERFORMED
Amphetamines: NOT DETECTED
Barbiturates: NOT DETECTED
Benzodiazepines: NOT DETECTED
Cocaine: NOT DETECTED
Opiates: NOT DETECTED
Tetrahydrocannabinol: NOT DETECTED

## 2022-05-05 LAB — DIFFERENTIAL
Abs Immature Granulocytes: 0.04 10*3/uL (ref 0.00–0.07)
Basophils Absolute: 0 10*3/uL (ref 0.0–0.1)
Basophils Relative: 1 %
Eosinophils Absolute: 0 10*3/uL (ref 0.0–0.5)
Eosinophils Relative: 1 %
Immature Granulocytes: 1 %
Lymphocytes Relative: 27 %
Lymphs Abs: 1.5 10*3/uL (ref 0.7–4.0)
Monocytes Absolute: 0.4 10*3/uL (ref 0.1–1.0)
Monocytes Relative: 7 %
Neutro Abs: 3.5 10*3/uL (ref 1.7–7.7)
Neutrophils Relative %: 63 %

## 2022-05-05 LAB — PROTIME-INR
INR: 1.1 (ref 0.8–1.2)
Prothrombin Time: 13.7 seconds (ref 11.4–15.2)

## 2022-05-05 LAB — RESP PANEL BY RT-PCR (RSV, FLU A&B, COVID)  RVPGX2
Influenza A by PCR: NEGATIVE
Influenza B by PCR: NEGATIVE
Resp Syncytial Virus by PCR: NEGATIVE
SARS Coronavirus 2 by RT PCR: NEGATIVE

## 2022-05-05 LAB — TSH: TSH: 7.511 u[IU]/mL — ABNORMAL HIGH (ref 0.350–4.500)

## 2022-05-05 LAB — GLUCOSE, CAPILLARY
Glucose-Capillary: 287 mg/dL — ABNORMAL HIGH (ref 70–99)
Glucose-Capillary: 346 mg/dL — ABNORMAL HIGH (ref 70–99)
Glucose-Capillary: 209 mg/dL — ABNORMAL HIGH (ref 70–99)
Glucose-Capillary: 131 mg/dL — ABNORMAL HIGH (ref 70–99)

## 2022-05-05 LAB — CBG MONITORING, ED: Glucose-Capillary: 262 mg/dL — ABNORMAL HIGH (ref 70–99)

## 2022-05-05 LAB — HEMOGLOBIN A1C
Hgb A1c MFr Bld: 9.6 % — ABNORMAL HIGH (ref 4.8–5.6)
Mean Plasma Glucose: 228.82 mg/dL

## 2022-05-05 LAB — ETHANOL: Alcohol, Ethyl (B): <10 mg/dL (ref <10)

## 2022-05-05 LAB — APTT: aPTT: 26 seconds (ref 24–36)

## 2022-05-05 LAB — T4, FREE: Free T4: 1.05 ng/dL (ref 0.61–1.12)

## 2022-05-05 LAB — MRSA NEXT GEN BY PCR, NASAL: MRSA by PCR Next Gen: NOT DETECTED

## 2022-05-05 SURGERY — IR WITH ANESTHESIA
Anesthesia: General

## 2022-05-05 MED ORDER — DEXMEDETOMIDINE HCL IN NACL 400 MCG/100ML IV SOLN
0.4000 ug/kg/h | INTRAVENOUS | Status: DC
  Administered 2022-05-05: 0.4 ug/kg/h via INTRAVENOUS
  Filled 2022-05-05: qty 100

## 2022-05-05 MED ORDER — DOCUSATE SODIUM 50 MG/5ML PO LIQD
100.0000 mg | Freq: Two times a day (BID) | ORAL | Status: DC
  Administered 2022-05-06 – 2022-05-07 (×3): 100 mg
  Filled 2022-05-05 (×3): qty 10

## 2022-05-05 MED ORDER — IOHEXOL 300 MG/ML  SOLN
150.0000 mL | Freq: Once | INTRAMUSCULAR | Status: AC | PRN
  Administered 2022-05-05: 60 mL via INTRA_ARTERIAL

## 2022-05-05 MED ORDER — ACETAMINOPHEN 650 MG RE SUPP: 650.0000 mg | RECTAL | Status: DC | PRN

## 2022-05-05 MED ORDER — ORAL CARE MOUTH RINSE: 15.0000 mL | OROMUCOSAL | Status: DC | PRN

## 2022-05-05 MED ORDER — NITROGLYCERIN 1 MG/10 ML FOR IR/CATH LAB
INTRA_ARTERIAL | Status: AC
  Filled 2022-05-05: qty 10

## 2022-05-05 MED ORDER — PROPOFOL 1000 MG/100ML IV EMUL
0.0000 ug/kg/min | INTRAVENOUS | Status: DC
  Administered 2022-05-05 (×2): 10 ug/kg/min via INTRAVENOUS
  Filled 2022-05-05 (×2): qty 100

## 2022-05-05 MED ORDER — IOHEXOL 350 MG/ML SOLN
75.0000 mL | Freq: Once | INTRAVENOUS | Status: AC | PRN
  Administered 2022-05-05: 75 mL via INTRAVENOUS

## 2022-05-05 MED ORDER — ACETAMINOPHEN 325 MG PO TABS: 650.0000 mg | ORAL_TABLET | ORAL | Status: DC | PRN

## 2022-05-05 MED ORDER — PHENYLEPHRINE HCL-NACL 20-0.9 MG/250ML-% IV SOLN
25.0000 ug/min | INTRAVENOUS | Status: DC
  Administered 2022-05-06: 25 ug/min via INTRAVENOUS
  Filled 2022-05-05: qty 250

## 2022-05-05 MED ORDER — PANTOPRAZOLE SODIUM 40 MG IV SOLR
40.0000 mg | Freq: Every day | INTRAVENOUS | Status: DC
  Administered 2022-05-05 – 2022-05-16 (×12): 40 mg via INTRAVENOUS
  Filled 2022-05-05 (×12): qty 10

## 2022-05-05 MED ORDER — LABETALOL HCL 5 MG/ML IV SOLN
10.0000 mg | INTRAVENOUS | Status: DC | PRN
  Administered 2022-05-09 – 2022-05-11 (×4): 10 mg via INTRAVENOUS
  Filled 2022-05-05 (×5): qty 4

## 2022-05-05 MED ORDER — SUCCINYLCHOLINE CHLORIDE 200 MG/10ML IV SOSY
PREFILLED_SYRINGE | INTRAVENOUS | Status: DC | PRN
  Administered 2022-05-05: 100 mg via INTRAVENOUS

## 2022-05-05 MED ORDER — FENTANYL CITRATE (PF) 250 MCG/5ML IJ SOLN
INTRAMUSCULAR | Status: DC | PRN
  Administered 2022-05-05: 50 ug via INTRAVENOUS
  Administered 2022-05-05 (×2): 25 ug via INTRAVENOUS

## 2022-05-05 MED ORDER — ACETAMINOPHEN 160 MG/5ML PO SOLN
650.0000 mg | ORAL | Status: DC | PRN
  Administered 2022-05-06 – 2022-05-09 (×5): 650 mg
  Filled 2022-05-05 (×6): qty 20.3

## 2022-05-05 MED ORDER — INSULIN ASPART 100 UNIT/ML IJ SOLN
0.0000 [IU] | INTRAMUSCULAR | Status: DC
  Administered 2022-05-05: 2 [IU] via SUBCUTANEOUS
  Administered 2022-05-05: 5 [IU] via SUBCUTANEOUS
  Administered 2022-05-06: 3 [IU] via SUBCUTANEOUS
  Administered 2022-05-06: 8 [IU] via SUBCUTANEOUS
  Administered 2022-05-06: 3 [IU] via SUBCUTANEOUS
  Administered 2022-05-06: 2 [IU] via SUBCUTANEOUS
  Administered 2022-05-06: 5 [IU] via SUBCUTANEOUS
  Administered 2022-05-06: 2 [IU] via SUBCUTANEOUS
  Administered 2022-05-07 (×3): 5 [IU] via SUBCUTANEOUS
  Administered 2022-05-07: 8 [IU] via SUBCUTANEOUS
  Administered 2022-05-07: 11 [IU] via SUBCUTANEOUS
  Administered 2022-05-08 (×3): 8 [IU] via SUBCUTANEOUS
  Administered 2022-05-08 (×4): 5 [IU] via SUBCUTANEOUS
  Administered 2022-05-09: 15 [IU] via SUBCUTANEOUS
  Administered 2022-05-09: 5 [IU] via SUBCUTANEOUS
  Administered 2022-05-09: 15 [IU] via SUBCUTANEOUS
  Administered 2022-05-09: 5 [IU] via SUBCUTANEOUS
  Administered 2022-05-09: 3 [IU] via SUBCUTANEOUS
  Administered 2022-05-09: 5 [IU] via SUBCUTANEOUS
  Administered 2022-05-10: 3 [IU] via SUBCUTANEOUS
  Administered 2022-05-10 (×3): 5 [IU] via SUBCUTANEOUS
  Administered 2022-05-10 – 2022-05-11 (×3): 3 [IU] via SUBCUTANEOUS
  Administered 2022-05-11: 2 [IU] via SUBCUTANEOUS
  Administered 2022-05-11 (×3): 5 [IU] via SUBCUTANEOUS
  Administered 2022-05-12 (×3): 3 [IU] via SUBCUTANEOUS
  Administered 2022-05-12: 5 [IU] via SUBCUTANEOUS
  Administered 2022-05-12: 3 [IU] via SUBCUTANEOUS
  Administered 2022-05-12 (×2): 5 [IU] via SUBCUTANEOUS
  Administered 2022-05-13 (×2): 3 [IU] via SUBCUTANEOUS
  Administered 2022-05-13: 5 [IU] via SUBCUTANEOUS
  Administered 2022-05-13 – 2022-05-14 (×6): 3 [IU] via SUBCUTANEOUS
  Administered 2022-05-14 (×3): 2 [IU] via SUBCUTANEOUS
  Administered 2022-05-15 (×3): 3 [IU] via SUBCUTANEOUS
  Administered 2022-05-15 – 2022-05-16 (×2): 2 [IU] via SUBCUTANEOUS
  Administered 2022-05-16: 8 [IU] via SUBCUTANEOUS
  Administered 2022-05-16: 5 [IU] via SUBCUTANEOUS

## 2022-05-05 MED ORDER — CEFAZOLIN SODIUM-DEXTROSE 2-4 GM/100ML-% IV SOLN
INTRAVENOUS | Status: AC
  Filled 2022-05-05: qty 100

## 2022-05-05 MED ORDER — ROCURONIUM BROMIDE 10 MG/ML (PF) SYRINGE
PREFILLED_SYRINGE | INTRAVENOUS | Status: DC | PRN
  Administered 2022-05-05: 50 mg via INTRAVENOUS

## 2022-05-05 MED ORDER — ASPIRIN 325 MG PO TBEC: 325.0000 mg | DELAYED_RELEASE_TABLET | Freq: Every day | ORAL | Status: DC

## 2022-05-05 MED ORDER — INSULIN GLARGINE-YFGN 100 UNIT/ML ~~LOC~~ SOLN
10.0000 [IU] | Freq: Every day | SUBCUTANEOUS | Status: DC
  Administered 2022-05-05 – 2022-05-06 (×2): 10 [IU] via SUBCUTANEOUS
  Filled 2022-05-05 (×3): qty 0.1

## 2022-05-05 MED ORDER — CLEVIDIPINE BUTYRATE 0.5 MG/ML IV EMUL
INTRAVENOUS | Status: AC
  Administered 2022-05-05: 16 mg/h via INTRAVENOUS
  Filled 2022-05-05: qty 50

## 2022-05-05 MED ORDER — ASPIRIN 300 MG RE SUPP
300.0000 mg | Freq: Every day | RECTAL | Status: DC
  Administered 2022-05-05: 300 mg via RECTAL
  Filled 2022-05-05: qty 1

## 2022-05-05 MED ORDER — LIDOCAINE 2% (20 MG/ML) 5 ML SYRINGE
INTRAMUSCULAR | Status: DC | PRN
  Administered 2022-05-05: 60 mg via INTRAVENOUS

## 2022-05-05 MED ORDER — SODIUM CHLORIDE (PF) 0.9 % IJ SOLN
INTRAVENOUS | Status: DC | PRN
  Administered 2022-05-05 (×3): 25 ug via INTRA_ARTERIAL

## 2022-05-05 MED ORDER — SODIUM CHLORIDE 0.9% FLUSH: 3.0000 mL | Freq: Once | INTRAVENOUS | Status: DC

## 2022-05-05 MED ORDER — SODIUM CHLORIDE 0.9 % IV SOLN: 250.0000 mL | INTRAVENOUS | Status: DC

## 2022-05-05 MED ORDER — ACETAMINOPHEN 325 MG PO TABS
650.0000 mg | ORAL_TABLET | ORAL | Status: DC | PRN
  Administered 2022-05-13: 650 mg via ORAL
  Filled 2022-05-05: qty 2

## 2022-05-05 MED ORDER — PROPOFOL 10 MG/ML IV BOLUS
INTRAVENOUS | Status: DC | PRN
  Administered 2022-05-05: 50 mg via INTRAVENOUS
  Administered 2022-05-05: 100 mg via INTRAVENOUS

## 2022-05-05 MED ORDER — FENTANYL CITRATE PF 50 MCG/ML IJ SOSY
25.0000 ug | PREFILLED_SYRINGE | INTRAMUSCULAR | Status: DC | PRN
  Administered 2022-05-08 – 2022-05-09 (×2): 25 ug via INTRAVENOUS
  Filled 2022-05-05: qty 1

## 2022-05-05 MED ORDER — CHLORHEXIDINE GLUCONATE CLOTH 2 % EX PADS
6.0000 | MEDICATED_PAD | Freq: Every day | CUTANEOUS | Status: DC
  Administered 2022-05-05 – 2022-05-16 (×15): 6 via TOPICAL

## 2022-05-05 MED ORDER — PHENYLEPHRINE HCL-NACL 20-0.9 MG/250ML-% IV SOLN
INTRAVENOUS | Status: DC | PRN
  Administered 2022-05-05: 20 ug/min via INTRAVENOUS

## 2022-05-05 MED ORDER — FENTANYL CITRATE PF 50 MCG/ML IJ SOSY
25.0000 ug | PREFILLED_SYRINGE | INTRAMUSCULAR | Status: DC | PRN
  Administered 2022-05-09 – 2022-05-10 (×2): 25 ug via INTRAVENOUS
  Administered 2022-05-11: 100 ug via INTRAVENOUS
  Filled 2022-05-05: qty 1
  Filled 2022-05-05: qty 2
  Filled 2022-05-05 (×2): qty 1

## 2022-05-05 MED ORDER — ACETAMINOPHEN 160 MG/5ML PO SOLN
650.0000 mg | ORAL | Status: DC | PRN
  Administered 2022-05-06 – 2022-05-16 (×4): 650 mg
  Filled 2022-05-05 (×3): qty 20.3

## 2022-05-05 MED ORDER — INSULIN GLARGINE-YFGN 100 UNIT/ML ~~LOC~~ SOLN
10.0000 [IU] | Freq: Every day | SUBCUTANEOUS | Status: DC
  Filled 2022-05-05: qty 0.1

## 2022-05-05 MED ORDER — INSULIN ASPART 100 UNIT/ML IJ SOLN
0.0000 [IU] | INTRAMUSCULAR | Status: DC
  Administered 2022-05-05: 5 [IU] via SUBCUTANEOUS
  Administered 2022-05-05: 7 [IU] via SUBCUTANEOUS

## 2022-05-05 MED ORDER — FENTANYL CITRATE (PF) 100 MCG/2ML IJ SOLN
INTRAMUSCULAR | Status: AC
  Filled 2022-05-05: qty 2

## 2022-05-05 MED ORDER — ACETAMINOPHEN 650 MG RE SUPP
650.0000 mg | RECTAL | Status: DC | PRN
  Administered 2022-05-05 – 2022-05-06 (×3): 650 mg via RECTAL
  Filled 2022-05-05 (×3): qty 1

## 2022-05-05 MED ORDER — PROPOFOL 1000 MG/100ML IV EMUL
INTRAVENOUS | Status: AC
  Filled 2022-05-05: qty 100

## 2022-05-05 MED ORDER — POLYETHYLENE GLYCOL 3350 17 G PO PACK
17.0000 g | PACK | Freq: Every day | ORAL | Status: DC
  Administered 2022-05-07 – 2022-05-12 (×6): 17 g
  Filled 2022-05-05 (×6): qty 1

## 2022-05-05 MED ORDER — PHENYLEPHRINE 80 MCG/ML (10ML) SYRINGE FOR IV PUSH (FOR BLOOD PRESSURE SUPPORT)
PREFILLED_SYRINGE | INTRAVENOUS | Status: DC | PRN
  Administered 2022-05-05: 80 ug via INTRAVENOUS

## 2022-05-05 MED ORDER — STROKE: EARLY STAGES OF RECOVERY BOOK: Freq: Once | Status: DC

## 2022-05-05 MED ORDER — ORAL CARE MOUTH RINSE
15.0000 mL | OROMUCOSAL | Status: DC
  Administered 2022-05-05 – 2022-05-16 (×131): 15 mL via OROMUCOSAL

## 2022-05-05 MED ORDER — CLEVIDIPINE BUTYRATE 0.5 MG/ML IV EMUL
0.0000 mg/h | INTRAVENOUS | Status: DC
  Administered 2022-05-05: 1 mg/h via INTRAVENOUS
  Administered 2022-05-05: 16 mg/h via INTRAVENOUS
  Administered 2022-05-05: 10 mg/h via INTRAVENOUS
  Administered 2022-05-06: 6 mg/h via INTRAVENOUS
  Administered 2022-05-06: 4 mg/h via INTRAVENOUS
  Administered 2022-05-06: 10 mg/h via INTRAVENOUS
  Administered 2022-05-07: 0 mg/h via INTRAVENOUS
  Filled 2022-05-05 (×2): qty 100
  Filled 2022-05-05: qty 50
  Filled 2022-05-05 (×2): qty 100

## 2022-05-05 MED ORDER — SODIUM CHLORIDE 0.9 % IV SOLN: INTRAVENOUS | Status: DC

## 2022-05-05 MED ORDER — CEFAZOLIN SODIUM-DEXTROSE 1-4 GM/50ML-% IV SOLN
INTRAVENOUS | Status: DC | PRN
  Administered 2022-05-05: 2 g via INTRAVENOUS

## 2022-05-05 MED ORDER — SODIUM CHLORIDE 0.9 % IV BOLUS
1000.0000 mL | Freq: Once | INTRAVENOUS | Status: AC
  Administered 2022-05-05: 1000 mL via INTRAVENOUS

## 2022-05-05 NOTE — Anesthesia Procedure Notes (Signed)
Arterial Line Insertion Start/End2/08/2022 9:55 AM, 05/05/2022 9:58 AM Performed by: Heide Scales, CRNA, CRNA  Patient location: Pre-op. Preanesthetic checklist: patient identified, IV checked, site marked, risks and benefits discussed, surgical consent, monitors and equipment checked, pre-op evaluation, timeout performed and anesthesia consent Lidocaine 1% used for infiltration Left, radial was placed Catheter size: 20 G Hand hygiene performed , maximum sterile barriers used  and Seldinger technique used Allen's test indicative of satisfactory collateral circulation Attempts: 1 Procedure performed without using ultrasound guided technique. Following insertion, dressing applied. Post procedure assessment: normal and unchanged  Patient tolerated the procedure well with no immediate complications.

## 2022-05-05 NOTE — Progress Notes (Signed)
OT Cancellation Note  Patient Details Name: [name] MRN: [id number] DOB: 15-Jun-1934   Cancelled Treatment:    Reason Eval/Treat Not Completed: Active bedrest order  Jeri Modena 05/05/2022, 1:49 PM

## 2022-05-05 NOTE — Transfer of Care (Signed)
Immediate Anesthesia Transfer of Care Note  Patient: [name]  Procedure(s) Performed: IR WITH ANESTHESIA  Patient Location: ICU  Anesthesia Type:General  Level of Consciousness: sedated and Patient remains intubated per anesthesia plan  Airway & Oxygen Therapy: Patient remains intubated per anesthesia plan and Patient placed on Ventilator (see vital sign flow sheet for setting)  Post-op Assessment: Report given to RN and Post -op Vital signs reviewed and stable  Post vital signs: Reviewed  Last Vitals:  Vitals Value Taken Time  BP 136/63 05/05/22 1125  Temp 36.4 C 05/05/22 1119  Pulse 80 05/05/22 1126  Resp    SpO2 97 % 05/05/22 1126  Vitals shown include unvalidated device data.  Last Pain:  Vitals:   05/05/22 1119  TempSrc: Oral         Complications: No notable events documented.

## 2022-05-05 NOTE — Inpatient Diabetes Management (Signed)
Inpatient Diabetes Program Recommendations  AACE/ADA: New Consensus Statement on Inpatient Glycemic Control (2015)  Target Ranges:  Prepandial:   less than 140 mg/dL      Peak postprandial:   less than 180 mg/dL (1-2 hours)      Critically ill patients:  140 - 180 mg/dL   Lab Results  Component Value Date   GLUCAP 287 (H) 05/05/2022   HGBA1C 9.6 (H) 05/05/2022    Review of Glycemic Control  Latest Reference Range & Units 05/05/22 09:19 05/05/22 13:03  Glucose-Capillary 70 - 99 mg/dL 262 (H) 287 (H)  (H): Data is abnormally high Diabetes history: Type 2 DM Outpatient Diabetes medications: none Current orders for Inpatient glycemic control: Novolog 0-9 units Q4H  Inpatient Diabetes Program Recommendations:    Consider adding Semglee 10 units QHS.   Thanks, Bronson Curb, MSN, RNC-OB Diabetes Coordinator [phone] (8a-5p)

## 2022-05-05 NOTE — Anesthesia Preprocedure Evaluation (Signed)
Anesthesia Evaluation  Patient identified by MRN, date of birth, ID band Patient unresponsive  Preop documentation limited or incomplete due to emergent nature of procedure.  Airway Mallampati: III  TM Distance: >3 FB     Dental  (+) Dental Advisory Given   Pulmonary COPD   breath sounds clear to auscultation       Cardiovascular hypertension, + Valvular Problems/Murmurs AS  Rhythm:Regular Rate:Normal     Neuro/Psych CVA    GI/Hepatic   Endo/Other  diabetes    Renal/GU      Musculoskeletal   Abdominal   Peds  Hematology   Anesthesia Other Findings   Reproductive/Obstetrics                             Anesthesia Physical Anesthesia Plan  ASA: 4  Anesthesia Plan: General   Post-op Pain Management:    Induction: Intravenous and Rapid sequence  PONV Risk Score and Plan: 3 and Dexamethasone and Ondansetron  Airway Management Planned: Oral ETT  Additional Equipment: Arterial line  Intra-op Plan:   Post-operative Plan: Possible Post-op intubation/ventilation  Informed Consent: I have reviewed the patients History and Physical, chart, labs and discussed the procedure including the risks, benefits and alternatives for the proposed anesthesia with the patient or authorized representative who has indicated his/her understanding and acceptance.       Plan Discussed with:   Anesthesia Plan Comments:        Anesthesia Quick Evaluation

## 2022-05-05 NOTE — Consult Note (Signed)
NAME:  [name], MRN:  [id number], DOB:  [birth year], LOS: 0 ADMISSION DATE:  05/05/2022 CONSULTATION DATE:  05/05/2022 REFERRING MD:  Leonel Ramsay - Neuro CHIEF COMPLAINT:  Code Stroke, MV post-L MCA occlusion   History of Present Illness:  88 year old woman who presented to [facility] ED 2/6 as Code Stroke. Presented with R-sided weakness/neglect. LKW 0030 2/6. PMHx significant for HTN, emphysema, T2DM, thyroid disease.  Presented to ED as Code Stroke; noted to have R facial droop, R-sided weakness and aphasia. Following some commands with LUE on ED arrival. LKW ~0030 2/6. CT Head negative for ICH. CTA Head/Neck with left MCA M1 occlusion, poor flow in M2 segments, ?low flow, severe vertebrobasilar stenosis, moderate bilateral PCA stenoses. Out of window for TNK. Taken to [facility] for mechanical thrombectomy with TICI3 revascularization. Left intubated post-procedure.   PCCM consulted for vent/hemodynamic management.  Pertinent Medical History:   Past Medical History:  Diagnosis Date   Diabetes mellitus without complication ([place])    Emphysema of lung ([place])    Hypertension    Thyroid disease    UTI (lower urinary tract infection)    Significant Hospital Events: Including procedures, antibiotic start and stop dates in addition to other pertinent events   2/6 - Presented to ED as Code Stroke. CT Head negative for ICH. CTA Head/Neck with left MCA M1 occlusion, poor flow in M2 segments, ?low flow, severe vertebrobasilar stenosis, moderate bilateral PCA stenoses. Out of window for TNK. Taken to [facility] for mechanical thrombectomy with TICI3 revascularization. Left intubated post-procedure. PCCM consulted for vent management.  Interim History / Subjective:  PCCM consulted post-NIR for vent management  Objective:  Blood pressure (!) 153/58, pulse 84, temperature (!) 97.5 F (36.4 C), temperature source Oral, height 5\' 2"  (1.575 m), weight 83 kg.    Vent Mode: PRVC FiO2 (%):  [60 %-100 %] 60 % Set  Rate:  [18 bmp] 18 bmp Vt Set:  [400 mL] 400 mL PEEP:  [5 cmH20] 5 cmH20   Intake/Output Summary (Last 24 hours) at 05/05/2022 1134 Last data filed at 05/05/2022 1125 Gross per 24 hour  Intake 1100 ml  Output 450 ml  Net 650 ml   Filed Weights   05/05/22 0900  Weight: 83 kg   Physical Examination: General: Acute-on-chronically ill-appearing elderly woman in NAD. Intubated, sedated. HEENT: [place]/AT, anicteric sclera, PERRL 2.28mm, moist mucous membranes. ETT in place. Neuro: Sedated. Does not respond to verbal, tactile or noxious stimuli. Does not withdraw to pain.Not following commands. No spontaneous movement of extremities on my exam (?some paralytic still on-board). +Cough and +Gag  CV: RRR, +high-pitched systolic murmur, III/VI heard best at R 2nd ICS. PULM: Breathing even and unlabored on vent (PEEP 5, FiO2 50%). Lung fields CTAB. GI: Obese, soft, nontender, nondistended. Normoactive bowel sounds. Extremities: Bilateral chronic symmetric 2+ LE edema noted. Skin: Warm/dry, chronic venous stasis changes of BLE, LE nails in poor repair.  Resolved Hospital Problem List:    Assessment & Plan:  Left MCA M1 occlusion S/p IR mechanical thrombectomy - Stroke team primary, appreciate management - Post-procedure management per NIR - Goal SBP 120-140 x 24H post-IR - ASA per Neuro; "allergy" is stomach cramps only, benefit > risk - MRI Brain per Neuro - F/u Echo, lipid panel - Frequent neuro checks - Neuroprotective measures: HOB > 30 degrees, normoglycemia, normothermia, electrolytes WNL - PT/OT/SLP when able to participate in care  Acute hypoxemic respiratory failure/respiratory insufficiency requiring MV History of emphysema - Continue full vent support (4-8cc/kg IBW) -  Wean FiO2 for O2 sat > 90% - Daily WUA/SBT as mental status allows - VAP bundle - Pulmonary hygiene - PAD protocol for sedation: Propofol and Fentanyl for goal RASS 0 to -1  Hypertension Likely HLD Mild AS ?CHF  component, given significant peripheral edema Seen by Dr. Audie Box in the past for LE edema, c/w venous insufficiency. Home regimen: Lisinopril. - Goal SBP 120-140 post-NIR x 24H - Cleviprex titrated to goal SBP - F/u Echo as above - Hold home antihypertensives for now - Start statin therapy  T2DM Hemoglobin A1c 9.6% on admission. - SSI - CBGs Q4H - Goal CBG 140-180 - Will likely require basal insulin once requirements more stable  Best Practice: (right click and "Reselect all SmartList Selections" daily)   Diet/type: NPO DVT prophylaxis: SCDs, AC per Neuro GI prophylaxis: PPI Lines: Arterial Line Foley:  Yes, and it is still needed Code Status:  full code Last date of multidisciplinary goals of care discussion [Pending]  Labs:  CBC: Recent Labs  Lab 05/05/22 1000 05/05/22 1011  WBC 5.5  --   NEUTROABS 3.5  --   HGB 13.9 13.3  HCT 39.7 39.0  MCV 90.6  --   PLT 221  --    Basic Metabolic Panel: Recent Labs  Lab 05/05/22 1011  NA 138  K 5.3*  CL 102  GLUCOSE 260*  BUN 13  CREATININE 0.50   GFR: Estimated Creatinine Clearance: 50.4 mL/min (by C-G formula based on SCr of 0.5 mg/dL). Recent Labs  Lab 05/05/22 1000  WBC 5.5   Liver Function Tests: No results for input(s): "AST", "ALT", "ALKPHOS", "BILITOT", "PROT", "ALBUMIN" in the last 168 hours. No results for input(s): "LIPASE", "AMYLASE" in the last 168 hours. No results for input(s): "AMMONIA" in the last 168 hours.  ABG:    Component Value Date/Time   HCO3 28.4 (H) 03/14/2007 1415   TCO2 29 05/05/2022 1011    Coagulation Profile: Recent Labs  Lab 05/05/22 1000  INR 1.1   Cardiac Enzymes: No results for input(s): "CKTOTAL", "CKMB", "CKMBINDEX", "TROPONINI" in the last 168 hours.  HbA1C: Hgb A1c MFr Bld  Date/Time Value Ref Range Status  05/05/2022 10:00 AM 9.6 (H) 4.8 - 5.6 % Final    Comment:    (NOTE) Pre diabetes:          5.7%-6.4%  Diabetes:              >6.4%  Glycemic control  for   <7.0% adults with diabetes   12/29/2017 04:48 PM 9.1 (H) 4.6 - 6.5 % Final    Comment:    Glycemic Control Guidelines for People with Diabetes:Non Diabetic:  <6%Goal of Therapy: <7%Additional Action Suggested:  >8%    CBG: Recent Labs  Lab 05/05/22 0919  GLUCAP 262*   Review of Systems:   Patient is encephalopathic and/or intubated. Therefore history has been obtained from chart review.   Past Medical History:  She,  has a past medical history of Diabetes mellitus without complication ([place]), Emphysema of lung ([place]), Hypertension, Thyroid disease, and UTI (lower urinary tract infection).   Surgical History:   Past Surgical History:  Procedure Laterality Date   REPLACEMENT TOTAL KNEE Left      Social History:   reports that she has never smoked. She has never used smokeless tobacco. She reports that she does not drink alcohol and does not use drugs.   Family History:  Her family history includes Asthma in her brother and mother; COPD in her  brother and sister; Cancer in her father.   Allergies: Allergies  Allergen Reactions   Aspirin Other (See Comments)    Stomach cramp    Home Medications: Prior to Admission medications   Medication Sig Start Date End Date Taking? Authorizing Provider  ACCU-CHEK AVIVA PLUS test strip USE AS DIRECTED TO TEST BLOOD SUGAR ONCE DAILY 04/06/18   [provider]  blood glucose meter kit and supplies Dispense based on patient and insurance preference. Use up to four times daily as directed. (FOR ICD-10 E10.9, E11.9). 10/27/17   Hedges, Dellis Filbert, PA-C  lisinopril (PRINIVIL,ZESTRIL) 10 MG tablet  02/17/18   [provider]    Critical care time: 39 minutes   The patient is critically ill with multiple organ system failure and requires high complexity decision making for assessment and support, frequent evaluation and titration of therapies, advanced monitoring, review of radiographic studies and interpretation of complex  data.   Critical Care Time devoted to patient care services, exclusive of separately billable procedures, described in this note is 39 minutes.  Lestine Mount, PA-C [facility] 05/05/22 11:34 AM  Please see [email or web address] for pager details.  From 7A-7P if no response, please call [phone] After hours, please call ELink [phone]

## 2022-05-05 NOTE — Progress Notes (Signed)
eLink Physician-Brief Progress Note Patient Name: [name] DOB: [birth year] MRN: [id number]   Date of Service  05/05/2022  HPI/Events of Note  Hypotension - BP = 103/42. LVEF = 60-65% No WMA. SBP goal = 120-140.  eICU Interventions  Plan: Bolus with 0.9 NaCl 1 liter IV over 1 hour now.  If SBP goal not met, start a Phenylephrine IV infusion via PIV. Titrate to SBP = 120-140.      Intervention Category Major Interventions: Hypotension - evaluation and management  Lysle Dingwall 05/05/2022, 9:39 PM

## 2022-05-05 NOTE — ED Triage Notes (Signed)
Code stroke, LnW  0030, Came from motel, where son noticed no movement on right side nor speech. Called ems.

## 2022-05-05 NOTE — Anesthesia Procedure Notes (Signed)
Procedure Name: Intubation Date/Time: 05/05/2022 10:01 AM  Performed by: Wilburn Cornelia, CRNAPre-anesthesia Checklist: Patient identified, Emergency Drugs available, Suction available and Patient being monitored Patient Re-evaluated:Patient Re-evaluated prior to induction Oxygen Delivery Method: Circle system utilized Preoxygenation: Pre-oxygenation with 100% oxygen Induction Type: IV induction Ventilation: Mask ventilation without difficulty Laryngoscope Size: Mac and 3 Grade View: Grade I Tube type: Oral Tube size: 7.5 mm Number of attempts: 1 Airway Equipment and Method: Stylet and Oral airway Placement Confirmation: ETT inserted through vocal cords under direct vision, positive ETCO2 and breath sounds checked- equal and bilateral Secured at: 22 cm Tube secured with: Tape Dental Injury: Teeth and Oropharynx as per pre-operative assessment

## 2022-05-05 NOTE — Anesthesia Postprocedure Evaluation (Signed)
Anesthesia Post Note  Patient: [name]  Procedure(s) Performed: IR WITH ANESTHESIA     Patient location during evaluation: SICU Anesthesia Type: General Level of consciousness: sedated Pain management: pain level controlled Vital Signs Assessment: post-procedure vital signs reviewed and stable Respiratory status: patient remains intubated per anesthesia plan Cardiovascular status: stable Postop Assessment: no apparent nausea or vomiting Anesthetic complications: no  No notable events documented.  Last Vitals:  Vitals:   05/05/22 1200 05/05/22 1230  BP: (!) 142/53 (!) 149/52  Pulse: 87 84  Temp:    SpO2: 97% 98%    Last Pain:  Vitals:   05/05/22 1119  TempSrc: Oral                 Tiajuana Amass

## 2022-05-05 NOTE — ED Notes (Signed)
Son Octavia Bruckner cell [phone] will find ride to hospital. Belongings in patient bag at bedside. Handoff given to Trauma RN and VIR.

## 2022-05-05 NOTE — ED Provider Notes (Signed)
[facility] Provider Note   CSN: [id number] Arrival date & time: 05/05/22  4098     History  No chief complaint on file.   [name] is a 88 y.o. female.  Level 5 caveat for acuity of condition.  Patient brought in as code stroke.  She was found to have right-sided weakness this morning with facial droop and aphasia.  Last seen normal was about 1230 this morning.  History of diabetes and hypertension.  No blood thinner use.  Patient unable to speak on arrival.  She is flaccid on the right side.  Will follow some commands of the left arm.  The history is provided by the patient and the EMS personnel. The history is limited by the condition of the patient.       Home Medications Prior to Admission medications   Medication Sig Start Date End Date Taking? Authorizing Provider  ACCU-CHEK AVIVA PLUS test strip USE AS DIRECTED TO TEST BLOOD SUGAR ONCE DAILY 04/06/18   [provider]  blood glucose meter kit and supplies Dispense based on patient and insurance preference. Use up to four times daily as directed. (FOR ICD-10 E10.9, E11.9). 10/27/17   Hedges, Dellis Filbert, PA-C  lisinopril (PRINIVIL,ZESTRIL) 10 MG tablet  02/17/18   [provider]      Allergies    Aspirin    Review of Systems   Review of Systems  Unable to perform ROS: Mental status change    Physical Exam Updated Vital Signs BP (!) 186/70 (BP Location: Right Arm)   Pulse 84   Wt 83 kg   BMI 33.47 kg/m  Physical Exam Constitutional:      General: She is in acute distress.     Appearance: Normal appearance.     Comments: Does not speak, protecting airway  HENT:     Head: Normocephalic and atraumatic.     Nose: No congestion or rhinorrhea.     Mouth/Throat:     Mouth: Mucous membranes are moist.  Eyes:     Extraocular Movements: Extraocular movements intact.     Pupils: Pupils are equal, round, and reactive to light.  Cardiovascular:      Rate and Rhythm: Normal rate and regular rhythm.     Heart sounds: No murmur heard. Pulmonary:     Effort: No respiratory distress.     Breath sounds: No wheezing.  Abdominal:     Tenderness: There is no abdominal tenderness.  Musculoskeletal:        General: No swelling or tenderness. Normal range of motion.  Skin:    General: Skin is warm.  Neurological:     Mental Status: She is alert.     Cranial Nerves: Cranial nerve deficit present.     Motor: Weakness present.     Comments: Aphasic, does not speak.  Follows some commands with the left arm.  Holds left arm off the bed against gravity and moves it spontaneously.  Unable to hold up right arm or right leg.     ED Results / Procedures / Treatments   Labs (all labs ordered are listed, but only abnormal results are displayed) Labs Reviewed  HEMOGLOBIN A1C - Abnormal; Notable for the following components:      Result Value   Hgb A1c MFr Bld 9.6 (*)    All other components within normal limits  I-STAT CHEM 8, ED - Abnormal; Notable for the following components:   Potassium 5.3 (*)  Glucose, Bld 260 (*)    Calcium, Ion 1.03 (*)    All other components within normal limits  CBG MONITORING, ED - Abnormal; Notable for the following components:   Glucose-Capillary 262 (*)    All other components within normal limits  RESP PANEL BY RT-PCR (RSV, FLU A&B, COVID)  RVPGX2  PROTIME-INR  APTT  CBC  DIFFERENTIAL  COMPREHENSIVE METABOLIC PANEL  ETHANOL  RAPID URINE DRUG SCREEN, HOSP PERFORMED  I-STAT CHEM 8, ED    EKG None  Radiology CT HEAD CODE STROKE WO CONTRAST  Result Date: 05/05/2022 CLINICAL DATA:  Code stroke.  Left facial droop EXAM: CT HEAD WITHOUT CONTRAST TECHNIQUE: Contiguous axial images were obtained from the base of the skull through the vertex without intravenous contrast. RADIATION DOSE REDUCTION: This exam was performed according to the departmental dose-optimization program which includes automated exposure  control, adjustment of the mA and/or kV according to patient size and/or use of iterative reconstruction technique. COMPARISON:  CT Head 01/23/22 FINDINGS: Brain: There is an acute infarct in the left MCA territory involving the left lentiform nucleus, left insula and M1-M3 cortices. Aspects is 5-6. No hemorrhage. No hydrocephalus. No extra-axial fluid collection. No mass lesion. Vascular: See same day CT head and neck angiogram for additional findings. No hyperdense vessel is visualized. Skull: Normal. Negative for fracture or focal lesion. Sinuses/Orbits: No mastoid or middle ear effusion. There are frothy secretions in the sphenoid sinus, which can be seen in the setting acute sinusitis. Bilateral lens replacement. Orbits are otherwise unremarkable. Other: None. ASPECTS [facility] Stroke Program Early CT Score) - Ganglionic level infarction (caudate, lentiform nuclei, internal capsule, insula, M1-M3 cortex): 3 - Supraganglionic infarction (M4-M6 cortex): 2-3 Total score (0-10 with 10 being normal): 5-6 IMPRESSION: Acute infarct in the left MCA territory. Aspects is 5-6. No hemorrhage. Findings were paged to Dr. Leonel Ramsay on 05/05/22 at 9:36 AM via [facility] paging system. Electronically Signed   By: Marin Roberts M.D.   On: 05/05/2022 09:44    Procedures .Critical Care  Performed by: Ezequiel Essex, MD Authorized by: Ezequiel Essex, MD   Critical care provider statement:    Critical care time (minutes):  35   Critical care time was exclusive of:  Separately billable procedures and treating other patients   Critical care was necessary to treat or prevent imminent or life-threatening deterioration of the following conditions:  CNS failure or compromise   Critical care was time spent personally by me on the following activities:  Development of treatment plan with patient or surrogate, discussions with consultants, evaluation of patient's response to treatment, examination of patient, ordering and review of  laboratory studies, ordering and review of radiographic studies, ordering and performing treatments and interventions, pulse oximetry, re-evaluation of patient's condition, review of old charts, blood draw for specimens and obtaining history from patient or surrogate   I assumed direction of critical care for this patient from another provider in my specialty: no     Care discussed with: admitting provider       Medications Ordered in ED Medications  sodium chloride flush (NS) 0.9 % injection 3 mL (has no administration in time range)    ED Course/ Medical Decision Making/ A&P                             Medical Decision Making Amount and/or Complexity of Data Reviewed Labs: ordered. Decision-making details documented in ED Course. Radiology: ordered and independent interpretation  performed. Decision-making details documented in ED Course. ECG/medicine tests: ordered and independent interpretation performed. Decision-making details documented in ED Course.  Risk Decision regarding hospitalization.   Patient here with right sided weakness and aphasia, last normal at 12:30 AM.  Code stroke on arrival seen with Dr. Leonel Ramsay of neurology.  CT head negative for hemorrhage.  Concern for possible large vessel occlusion.  Patient not TNK candidate given delayed presentation >4.5 hours.  CT head shows MCA infarct on the left.  No hemorrhage.  Results reviewed interpreted by me.  CTA positive for MCA occlusion. IMPRESSION: 1. Acute occlusion of the proximal to mid portion of the left M1 segment. Poor opacification of the M2 segments in the left MCA territory may be secondary to decreased flow in the setting of occlusive thrombus versus additional small scattered thrombi in the distal left MCA territory. Perfusion findings, as above. 2. 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  Patient will have CTA performed.  Dr. Leonel Ramsay discussing with  interventional radiology possible intervention for thrombectomy.  She is not a TNK candidate given delay in presentation.  Patient taken emergently to interventional radiology.       Final Clinical Impression(s) / ED Diagnoses Final diagnoses:  Acute ischemic stroke [facility])    Rx / DC Orders ED Discharge Orders     None         Chipper Koudelka, Annie Main, MD 05/05/22 1045

## 2022-05-05 NOTE — Code Documentation (Signed)
Stroke Response Nurse Documentation Code Documentation  [name] is a 88 y.o. female arriving to [facility]  via [facility] on 05/05/2022 with past medical hx of diabetes, hypertension, UTI, emphysema of lung, and thyroid disease. On No antithrombotic.   Patient from home where she was LKW at [place] and woke up this morning unable to speak or move right side. Code stroke was activated by EMS.   Stroke team at the bedside on patient arrival. Labs drawn and patient cleared for CT by Dr. Scot Dock. Patient to CT with team. NIHSS 29, see documentation for details and code stroke times. Patient with decreased LOC, disoriented, not following commands, left gaze preference , right hemianopia, right facial droop, bilateral arm weakness, bilateral leg weakness, right decreased sensation, Global aphasia , dysarthria , and Visual  neglect on exam. The following imaging was completed:  CT Head, CTA, and CTP. Patient is not a candidate for IV Thrombolytic due to out of the window. Patient is a candidate for IR due to LVO. Dr. Leonel Ramsay spoke with the patient's son. Consent obtained by providers. Patient taken to IR suite and prepped for procedure by team.   Care Plan: admit to ICU post IR with frequent assessments per orders.   Bedside handoff with IR RN Elta Guadeloupe.    Leverne Humbles Stroke Response RN

## 2022-05-05 NOTE — Progress Notes (Signed)
PT Cancellation Note  Patient Details Name: [name] MRN: [id number] DOB: [birth year]   Cancelled Treatment:    Reason Eval/Treat Not Completed: Active bedrest order. Pt on bedrest after IR, intubated at this time. PT will follow up tomorrow.   Zenaida Niece 05/05/2022, 12:19 PM

## 2022-05-05 NOTE — H&P (Signed)
Neurology H&P  CC: Right-sided weakness  History is obtained from:son   HPI: [name] is a 88 y.o. female  who at baseline uses a walker need some help, but is able to get around on her own.  She does not have any symptoms of dementia per the son, thinks clearly and has a quality of life.  She was last seen normal shortly after midnight, then it was noted she was not moving her right side this morning.  Of note her social situation is tenuous, they have been affected from their house and are currently living in a extended stay hotel looking for a longer term place.   LKW: 12:30 AM tpa given?: No, outside of window IR Thrombectomy?  Yes Modified Rankin Scale: 3-Moderate disability-requires help but walks WITHOUT assistance NIHSS: 27  Past Medical History:  Diagnosis Date   Diabetes mellitus without complication (HCC)    Emphysema of lung ([place])    Hypertension    Thyroid disease    UTI (lower urinary tract infection)      Family History  Problem Relation Age of Onset   Asthma Mother    Cancer Father    COPD Sister    COPD Brother    Asthma Brother      Social History:  reports that she has never smoked. She has never used smokeless tobacco. She reports that she does not drink alcohol and does not use drugs.   Prior to Admission medications   Medication Sig Start Date End Date Taking? Authorizing Provider  ACCU-CHEK AVIVA PLUS test strip USE AS DIRECTED TO TEST BLOOD SUGAR ONCE DAILY 04/06/18   [provider]  blood glucose meter kit and supplies Dispense based on patient and insurance preference. Use up to four times daily as directed. (FOR ICD-10 E10.9, E11.9). 10/27/17   Hedges, Dellis Filbert, PA-C  lisinopril (PRINIVIL,ZESTRIL) 10 MG tablet  02/17/18   [provider]     Exam: Current vital signs: BP (!) 186/70 (BP Location: Right Arm)   Pulse 84   Wt 83 kg   BMI 33.47 kg/m    Physical Exam  Constitutional: Appears well-developed and  well-nourished.  Psych: Affect appropriate to situation Eyes: No scleral injection HENT: No OP obstrucion Head: Normocephalic.  Cardiovascular: Normal rate and regular rhythm.  Respiratory: Effort normal and nonlabored breathing GI: Soft.  No distension. There is no tenderness.  Skin: WDI Extremities: Significant pitting edema bilaterally  Neuro: Mental Status: Patient is drowsy, but easily arousable.  She is densely aphasic, does not follow commands or answer questions. Cranial Nerves: II: She does not blink to threat from the right, but does from the left III,IV, VI: She has a left gaze preference and does not cross midline, but is not deviated V, VII: Facial movement is weak on the right Motor: She has a flaccid right paralysis, she does withdraw minimally to pain in the right leg, she does not hold her left leg against gravity  sensory: She responds less to noxious stimulation on the right than the left  Cerebellar: Does not perform  I have reviewed labs in epic and the pertinent results are: Results for orders placed or performed during the hospital encounter of 05/05/22 (from the past 24 hour(s))  CBG monitoring, ED     Status: Abnormal   Collection Time: 05/05/22  9:19 AM  Result Value Ref Range   Glucose-Capillary 262 (H) 70 - 99 mg/dL  Protime-INR     Status: None  Collection Time: 05/05/22 10:00 AM  Result Value Ref Range   Prothrombin Time 13.7 11.4 - 15.2 seconds   INR 1.1 0.8 - 1.2  APTT     Status: None   Collection Time: 05/05/22 10:00 AM  Result Value Ref Range   aPTT 26 24 - 36 seconds  CBC     Status: None   Collection Time: 05/05/22 10:00 AM  Result Value Ref Range   WBC 5.5 4.0 - 10.5 K/uL   RBC 4.38 3.87 - 5.11 MIL/uL   Hemoglobin 13.9 12.0 - 15.0 g/dL   HCT 39.7 36.0 - 46.0 %   MCV 90.6 80.0 - 100.0 fL   MCH 31.7 26.0 - 34.0 pg   MCHC 35.0 30.0 - 36.0 g/dL   RDW 12.3 11.5 - 15.5 %   Platelets 221 150 - 400 K/uL   nRBC 0.0 0.0 - 0.2 %   Differential     Status: None   Collection Time: 05/05/22 10:00 AM  Result Value Ref Range   Neutrophils Relative % 63 %   Neutro Abs 3.5 1.7 - 7.7 K/uL   Lymphocytes Relative 27 %   Lymphs Abs 1.5 0.7 - 4.0 K/uL   Monocytes Relative 7 %   Monocytes Absolute 0.4 0.1 - 1.0 K/uL   Eosinophils Relative 1 %   Eosinophils Absolute 0.0 0.0 - 0.5 K/uL   Basophils Relative 1 %   Basophils Absolute 0.0 0.0 - 0.1 K/uL   Immature Granulocytes 1 %   Abs Immature Granulocytes 0.04 0.00 - 0.07 K/uL  Hemoglobin A1c     Status: Abnormal   Collection Time: 05/05/22 10:00 AM  Result Value Ref Range   Hgb A1c MFr Bld 9.6 (H) 4.8 - 5.6 %   Mean Plasma Glucose 228.82 mg/dL  I-stat chem 8, ED     Status: Abnormal   Collection Time: 05/05/22 10:11 AM  Result Value Ref Range   Sodium 138 135 - 145 mmol/L   Potassium 5.3 (H) 3.5 - 5.1 mmol/L   Chloride 102 98 - 111 mmol/L   BUN 13 8 - 23 mg/dL   Creatinine, Ser 0.50 0.44 - 1.00 mg/dL   Glucose, Bld 260 (H) 70 - 99 mg/dL   Calcium, Ion 1.03 (L) 1.15 - 1.40 mmol/L   TCO2 29 22 - 32 mmol/L   Hemoglobin 13.3 12.0 - 15.0 g/dL   HCT 39.0 36.0 - 46.0 %     I have reviewed the images obtained: CT head-acute ischemic stroke in the anterior left MCA distribution CT A/P she has an M2 occlusion with resulting infarct, but also significant areas of penumbra in the distribution of her M1 occlusion  Primary Diagnosis:  Cerebral infarction due to embolism of  left middle cerebral artery.   Secondary Diagnosis: Cerebral edema, Heart failure, unspecified, Type 2 diabetes mellitus with hyperglycemia , and Hypocalcemia, hyperkalemia   Impression: 88 year old female with likely embolic right M2 occlusion, as well as more proximal MCA occlusion with resultant penumbra.  Unclear if artery to artery from the intrinsic stenosis versus embolus.  Given the large area of penumbra, and the fact that she had decent quality of life prior to this insult, the risks and  benefits of the thrombectomy were discussed with the patient's son who indicated that she would want to go ahead with the procedure.    Plan: Stroke - HgbA1c, fasting lipid panel - MRI of the brain without contrast - Frequent neuro checks - Echocardiogram - Prophylactic therapy-Antiplatelet med:  Aspirin -300 mg PR or 325 mg p.o.(though she has an listed allergy, this symptom is stomach cramps and therefore I think the benefit outweighs risk in this scenario) - Risk factor modification - Telemetry monitoring - PT consult, OT consult, Speech consult - Stroke team to follow  Diabetes: Unclear if new diagnosis, will start SSI  ?  CHF based on peripheral edema -Echo, CXR - Appreciate critical care consultation   This patient is critically ill and at significant risk of neurological worsening, death and care requires constant monitoring of vital signs, hemodynamics,respiratory and cardiac monitoring, neurological assessment, discussion with family, other specialists and medical decision making of high complexity. I spent 65 minutes of neurocritical care time  in the care of  this patient. This was time spent independent of any time provided by nurse practitioner or PA.  Roland Rack, MD [facility] [phone]  If 7pm- 7am, please page neurology on call as listed in [place].

## 2022-05-05 NOTE — Procedures (Signed)
INR.  Status post left common carotid arteriogram.  Right CFA approach.  Findings.  Occluded left middle cerebral artery M1 segment.  Status post complete  revascularization of occluded left middle cerebral artery M1 segment with 1 pass with an 070 free climb aspiration catheter rate proximal flow arrest achieving a TICI 3 revascularization.  8 French Angio-Seal closure device deployed for hemostasis at the right groin puncture site.  Distal pulses both DP's dopplerable.  PTs not dopplerable unchanged from prior to the procedure.  Post CT brain  no ICH .[name]  Mild contrast staining noted in the posterior aspect of the left putamen.  Patient left intubated due to her medical accommodation.  [name].

## 2022-05-06 ENCOUNTER — Inpatient Hospital Stay (HOSPITAL_COMMUNITY): Payer: Medicare HMO

## 2022-05-06 DIAGNOSIS — I10 Essential (primary) hypertension: Secondary | ICD-10-CM

## 2022-05-06 DIAGNOSIS — I6389 Other cerebral infarction: Secondary | ICD-10-CM | POA: Diagnosis not present

## 2022-05-06 DIAGNOSIS — J9589 Other postprocedural complications and disorders of respiratory system, not elsewhere classified: Secondary | ICD-10-CM | POA: Diagnosis not present

## 2022-05-06 DIAGNOSIS — I639 Cerebral infarction, unspecified: Secondary | ICD-10-CM | POA: Diagnosis not present

## 2022-05-06 LAB — CBC WITH DIFFERENTIAL/PLATELET
Abs Immature Granulocytes: 0.08 10*3/uL — ABNORMAL HIGH (ref 0.00–0.07)
Basophils Absolute: 0 10*3/uL (ref 0.0–0.1)
Basophils Relative: 0 %
Eosinophils Absolute: 0 10*3/uL (ref 0.0–0.5)
Eosinophils Relative: 0 %
HCT: 37.7 % (ref 36.0–46.0)
Hemoglobin: 12.6 g/dL (ref 12.0–15.0)
Immature Granulocytes: 1 %
Lymphocytes Relative: 18 %
Lymphs Abs: 1.7 10*3/uL (ref 0.7–4.0)
MCH: 30.4 pg (ref 26.0–34.0)
MCHC: 33.4 g/dL (ref 30.0–36.0)
MCV: 91.1 fL (ref 80.0–100.0)
Monocytes Absolute: 0.8 10*3/uL (ref 0.1–1.0)
Monocytes Relative: 8 %
Neutro Abs: 6.7 10*3/uL (ref 1.7–7.7)
Neutrophils Relative %: 73 %
Platelets: 203 10*3/uL (ref 150–400)
RBC: 4.14 MIL/uL (ref 3.87–5.11)
RDW: 12.3 % (ref 11.5–15.5)
WBC: 9.3 10*3/uL (ref 4.0–10.5)
nRBC: 0 % (ref 0.0–0.2)

## 2022-05-06 LAB — LIPID PANEL
Cholesterol: 176 mg/dL (ref 0–200)
HDL: 30 mg/dL — ABNORMAL LOW (ref >40)
LDL Cholesterol: 112 mg/dL — ABNORMAL HIGH (ref 0–99)
Total CHOL/HDL Ratio: 5.9 RATIO
Triglycerides: 172 mg/dL — ABNORMAL HIGH (ref <150)
VLDL: 34 mg/dL (ref 0–40)

## 2022-05-06 LAB — TRIGLYCERIDES: Triglycerides: 177 mg/dL — ABNORMAL HIGH (ref <150)

## 2022-05-06 LAB — ECHOCARDIOGRAM COMPLETE
AR max vel: 1.47 cm2
AV Area VTI: 1.55 cm2
AV Area mean vel: 1.42 cm2
AV Mean grad: 28.5 mmHg
AV Peak grad: 47.6 mmHg
Ao pk vel: 3.45 m/s
Area-P 1/2: 3.77 cm2
Height: 62 in
MV VTI: 2.13 cm2
S' Lateral: 2.2 cm
Weight: 2927.71 oz

## 2022-05-06 LAB — BASIC METABOLIC PANEL
Anion gap: 10 (ref 5–15)
BUN: 7 mg/dL — ABNORMAL LOW (ref 8–23)
CO2: 22 mmol/L (ref 22–32)
Calcium: 8.1 mg/dL — ABNORMAL LOW (ref 8.9–10.3)
Chloride: 105 mmol/L (ref 98–111)
Creatinine, Ser: 0.66 mg/dL (ref 0.44–1.00)
GFR, Estimated: >60 mL/min (ref >60)
Glucose, Bld: 133 mg/dL — ABNORMAL HIGH (ref 70–99)
Potassium: 3.6 mmol/L (ref 3.5–5.1)
Sodium: 137 mmol/L (ref 135–145)

## 2022-05-06 LAB — URINALYSIS, ROUTINE W REFLEX MICROSCOPIC
Bilirubin Urine: NEGATIVE
Glucose, UA: NEGATIVE mg/dL
Hgb urine dipstick: NEGATIVE
Ketones, ur: 5 mg/dL — AB
Leukocytes,Ua: NEGATIVE
Nitrite: NEGATIVE
Protein, ur: NEGATIVE mg/dL
Specific Gravity, Urine: 1.015 (ref 1.005–1.030)
pH: 6 (ref 5.0–8.0)

## 2022-05-06 LAB — GLUCOSE, CAPILLARY
Glucose-Capillary: 132 mg/dL — ABNORMAL HIGH (ref 70–99)
Glucose-Capillary: 131 mg/dL — ABNORMAL HIGH (ref 70–99)
Glucose-Capillary: 169 mg/dL — ABNORMAL HIGH (ref 70–99)
Glucose-Capillary: 179 mg/dL — ABNORMAL HIGH (ref 70–99)
Glucose-Capillary: 229 mg/dL — ABNORMAL HIGH (ref 70–99)
Glucose-Capillary: 255 mg/dL — ABNORMAL HIGH (ref 70–99)

## 2022-05-06 LAB — MAGNESIUM: Magnesium: 1.9 mg/dL (ref 1.7–2.4)

## 2022-05-06 LAB — PHOSPHORUS: Phosphorus: 2.6 mg/dL (ref 2.5–4.6)

## 2022-05-06 MED ORDER — JEVITY 1.2 CAL PO LIQD
1000.0000 mL | ORAL | Status: DC
  Administered 2022-05-06 – 2022-05-16 (×11): 1000 mL
  Filled 2022-05-06 (×20): qty 1000

## 2022-05-06 MED ORDER — PROSOURCE TF20 ENFIT COMPATIBL EN LIQD
60.0000 mL | Freq: Every day | ENTERAL | Status: DC
  Administered 2022-05-06 – 2022-05-16 (×11): 60 mL
  Filled 2022-05-06 (×11): qty 60

## 2022-05-06 MED ORDER — ROSUVASTATIN CALCIUM 20 MG PO TABS
20.0000 mg | ORAL_TABLET | Freq: Every day | ORAL | Status: DC
  Administered 2022-05-06 – 2022-05-16 (×11): 20 mg
  Filled 2022-05-06 (×11): qty 1

## 2022-05-06 MED ORDER — ASPIRIN 81 MG PO CHEW: 81.0000 mg | CHEWABLE_TABLET | Freq: Every day | ORAL | Status: DC

## 2022-05-06 MED ORDER — CLOPIDOGREL BISULFATE 75 MG PO TABS: 75.0000 mg | ORAL_TABLET | Freq: Every day | ORAL | Status: DC

## 2022-05-06 MED ORDER — ADULT MULTIVITAMIN W/MINERALS CH
1.0000 | ORAL_TABLET | Freq: Every day | ORAL | Status: DC
  Administered 2022-05-06 – 2022-05-16 (×11): 1
  Filled 2022-05-06 (×11): qty 1

## 2022-05-06 NOTE — Progress Notes (Signed)
  Echocardiogram 2D Echocardiogram has been performed.  [name] 05/06/2022, 1:17 PM

## 2022-05-06 NOTE — Progress Notes (Signed)
OT Cancellation Note  Patient Details Name: Shantasia Hunnell MRN: [id number] DOB: Aug 15, 1934   Cancelled Treatment:    Reason Eval/Treat Not Completed: Medical issues which prohibited therapy (Hold per RN, weaning from vent, low arousal, not following commands/waking up. OT will continue to monitor patient and assess appropriateness for evaluation completion.)  Ailene Ravel, OTR/L,CBIS  Supplemental OT - [facility] and [facility] Secure Chat Preferred   05/06/2022, 10:43 AM

## 2022-05-06 NOTE — Progress Notes (Addendum)
STROKE TEAM PROGRESS NOTE   INTERVAL HISTORY Patient is seen in her room with no family at the bedside.  She presented yesterday when it was noted that she was unable to move her right side.  TNK was not given as she presented outside the window.  She was taken to interventional radiology for mechanical thrombectomy of occluded left MCA, and TICI 3 flow was achieved after 1 pass.  MRI this morning shows left MCA territory infarct with some hemorrhagic transformation.  Vitals:   05/06/22 0930 05/06/22 1000 05/06/22 1030 05/06/22 1100  BP:  (!) 131/59  (!) 129/48  Pulse: 77 96 78 86  Resp:      Temp:      TempSrc:      SpO2: 96% 94% 96% 99%  Weight:      Height:       CBC:  Recent Labs  Lab 05/05/22 1000 05/05/22 1011 05/05/22 1400 05/06/22 0505  WBC 5.5  --   --  9.3  NEUTROABS 3.5  --   --  6.7  HGB 13.9   < > 12.9 12.6  HCT 39.7   < > 38.0 37.7  MCV 90.6  --   --  91.1  PLT 221  --   --  203   < > = values in this interval not displayed.   Basic Metabolic Panel:  Recent Labs  Lab 05/05/22 1152 05/05/22 1400 05/06/22 0505  NA 136 138 137  K 3.8 3.8 3.6  CL 99  --  105  CO2 25  --  22  GLUCOSE 284*  --  133*  BUN 9  --  7*  CREATININE 0.59  --  0.66  CALCIUM 8.6*  --  8.1*   Lipid Panel:  Recent Labs  Lab 05/06/22 0505  CHOL 176  TRIG 172*  177*  HDL 30*  CHOLHDL 5.9  VLDL 34  LDLCALC 112*   HgbA1c:  Recent Labs  Lab 05/05/22 1000  HGBA1C 9.6*   Urine Drug Screen:  Recent Labs  Lab 05/05/22 1152  LABOPIA NONE DETECTED  COCAINSCRNUR NONE DETECTED  LABBENZ NONE DETECTED  AMPHETMU NONE DETECTED  THCU NONE DETECTED  LABBARB NONE DETECTED    Alcohol Level  Recent Labs  Lab 05/05/22 1000  ETH <10    IMAGING past 24 hours DG CHEST PORT 1 VIEW  Result Date: 05/06/2022 CLINICAL DATA:  Acute hypercapnic respiratory failure. EXAM: PORTABLE CHEST 1 VIEW COMPARISON:  May 05, 2022. FINDINGS: The heart size and mediastinal contours are within  normal limits. Both lungs are clear. The visualized skeletal structures are unremarkable. IMPRESSION: No active disease. Electronically Signed   By: Marijo Conception M.D.   On: 05/06/2022 09:25    PHYSICAL EXAM General: Intubated elderly patient in no acute distress Respiratory: Respirations synchronous with ventilator Neurological: Pupils equal round and reactive, cough and gag present, right gaze deviation noted, patient unable to follow commands does not move right side at all will localize noxious stimuli with left upper extremity and moves left lower extremity spontaneously and to noxious stimuli.  ASSESSMENT/PLAN [name] is a 88 y.o. female with history of diabetes, emphysema and hypertension presenting with inability to move her right side.  TNK was not given as she presented outside the window.  She was taken to interventional radiology for mechanical thrombectomy of occluded left MCA, and TICI 3 flow was achieved after 1 pass.  MRI this morning shows left MCA territory infarct with  some hemorrhagic transformation.  Stroke:  left MCA stroke Etiology: Possibly embolic versus large vessel stenosis Code Stroke CT head acute infarct in the left MCA territory ASPECTS 5-6 CTA head & neck acute occlusion of left M1 segment with poor opacifications of M2 segments, severe stenosis of vertebrobasilar junction and proximal basilar artery, multifocal mild to moderate stenoses in bilateral PCA territory, severe stenosis at left V3 V4 junction Post IR CT no evidence of hemorrhage MRI left MCA territory infarct with some hemorrhagic transformation 2D Echo pending LDL 112 HgbA1c 9.6 VTE prophylaxis -SCDs    Diet   Diet NPO time specified   No antithrombotic prior to admission, now on No antithrombotic secondary to hemorrhagic transformation, DAPT discontinued Therapy recommendations: Pending Disposition: Pending, goals of care conversations with family ongoing  Hypertension Home  meds: Lisinopril 20 mg daily Stable Keep systolic blood pressure 161-096 for 24 hours then liberalized to less than 180 Long-term BP goal normotensive  Hyperlipidemia Home meds: None LDL 112, goal < 70 Add rosuvastatin 20 mg daily Continue statin at discharge  Diabetes type II Uncontrolled Home meds: Glipizide 10 mg twice daily HgbA1c 9.6, goal < 7.0 CBGs Recent Labs    05/05/22 2307 05/06/22 0318 05/06/22 0802  GLUCAP 131* 132* 131*  Recommend close PCP follow-up for better diabetes control SSI  Respiratory failure Patient left intubated after procedure Ventilator management per CCM Extubate when able  Fever Patient febrile with Tmax of 101.4 Chest x-ray shows no acute abnormalities UA negative No leukocytosis  Other Stroke Risk Factors Advanced Age >/= 68  Obesity, Body mass index is 33.47 kg/m., BMI >/= 30 associated with increased stroke risk, recommend weight loss, diet and exercise as appropriate   Other Active Problems None  Hospital day # Archer , MSN, AGACNP-BC [facility] See Amion for schedule and pager information 05/06/2022 1:18 PM    ATTENDING ATTESTATION:  88 year old with left M1 occlusion status post thrombectomy.  MRI completed shows large left MCA stroke with hemorrhagic transformation.  On exam she is unresponsive not following commands left gaze preference with mild localizing the right upper extremity to vigorous stimulation.  Family arrived later in the morning and discussed with her daughter and granddaughter for poor prognosis.  They will discuss with further family and hold off on making decisions.  Hold antiplatelets due to hemorrhagic transformation.  On tube feeds, cortrak placed. Echo completed.   Dr. Reeves Forth evaluated pt independently, reviewed imaging, chart, labs. Discussed and formulated plan with the Resident/APP. Changes were made to the note where appropriate. Please see APP/resident note above for  details.      This patient is critically ill due to respiratory distress, stroke s/p IR and at significant risk of neurological worsening, death form heart failure, respiratory failure, recurrent stroke, bleeding from [facility], seizure, sepsis. This patient's care requires constant monitoring of vital signs, hemodynamics, respiratory and cardiac monitoring, review of multiple databases, neurological assessment, discussion with family, other specialists and medical decision making of high complexity. I spent 35 minutes of neurocritical care time in the care of this patient.   Karlin Binion,MD   To contact Stroke Continuity provider, please refer to [email or web address] After hours, contact General Neurology

## 2022-05-06 NOTE — Procedures (Signed)
Cortrak  Person Inserting Tube:  Ranell Patrick D, RD Tube Type:  Cortrak - 43 inches Tube Size:  10 Tube Location:  Left nare Secured by: Bridle Technique Used to Measure Tube Placement:  Marking at nare/corner of mouth Cortrak Secured At:  66 cm Procedure Comments:  Cortrak Tube Team Note:  Consult received to place a Cortrak feeding tube.   X-ray is required, abdominal x-ray has been ordered by the Cortrak team. Please confirm tube placement before using the Cortrak tube.   If the tube becomes dislodged please keep the tube and contact the Cortrak team at [email or web address] for replacement.  If after hours and replacement cannot be delayed, place a NG tube and confirm placement with an abdominal x-ray.    Ranell Patrick, RD, LDN Clinical Dietitian RD pager # available in [place]  After hours/weekend pager # available in [facility]

## 2022-05-06 NOTE — Progress Notes (Signed)
Patient transported to MRI and back without complications. RN at bedside. 

## 2022-05-06 NOTE — Progress Notes (Signed)
NAME:  [name], MRN:  [id number], DOB:  Aug 01, 1934, LOS: 1 ADMISSION DATE:  05/05/2022 CONSULTATION DATE:  05/05/2022 REFERRING MD:  Leonel Ramsay - Neuro CHIEF COMPLAINT:  Code Stroke, MV post-L MCA occlusion   History of Present Illness:  88 year old woman who presented to [facility] ED 2/6 as Code Stroke. Presented with R-sided weakness/neglect. LKW 0030 2/6. PMHx significant for HTN, emphysema, T2DM, thyroid disease.  Presented to ED as Code Stroke; noted to have R facial droop, R-sided weakness and aphasia. Following some commands with LUE on ED arrival. LKW ~0030 2/6. CT Head negative for ICH. CTA Head/Neck with left MCA M1 occlusion, poor flow in M2 segments, ?low flow, severe vertebrobasilar stenosis, moderate bilateral PCA stenoses. Out of window for TNK. Taken to [facility] for mechanical thrombectomy with TICI3 revascularization. Left intubated post-procedure.   PCCM consulted for medical management  Pertinent Medical History:   Past Medical History:  Diagnosis Date   Diabetes mellitus without complication ([place])    Emphysema of lung ([place])    Hypertension    Thyroid disease    UTI (lower urinary tract infection)    Significant Hospital Events: Including procedures, antibiotic start and stop dates in addition to other pertinent events   2/6 - Presented to ED as Code Stroke. CT Head negative for ICH. CTA Head/Neck with left MCA M1 occlusion, poor flow in M2 segments, ?low flow, severe vertebrobasilar stenosis, moderate bilateral PCA stenoses. Out of window for TNK. Taken to [facility] for mechanical thrombectomy with TICI3 revascularization. Left intubated post-procedure. PCCM consulted for vent management.  Interim History / Subjective:  Patient started spiking fever with Tmax 102 She is off sedation, tolerating spontaneous breathing trial but not waking up Became hypotensive overnight requiring 1 L of IV fluids followed by phenylephrine for short period  Objective:  Blood pressure (!)  116/54, pulse 82, temperature (!) 101 F (38.3 C), temperature source Axillary, resp. rate (!) 22, height 5\' 2"  (1.575 m), weight 83 kg, SpO2 96 %.    Vent Mode: PSV;CPAP FiO2 (%):  [40 %-100 %] 40 % Set Rate:  [18 bmp] 18 bmp Vt Set:  [400 mL] 400 mL PEEP:  [5 cmH20] 5 cmH20 Pressure Support:  [10 cmH20] 10 cmH20 Plateau Pressure:  [13 cmH20] 13 cmH20   Intake/Output Summary (Last 24 hours) at 05/06/2022 0756 Last data filed at 05/06/2022 7829 Gross per 24 hour  Intake 4061.35 ml  Output 2050 ml  Net 2011.35 ml   Filed Weights   05/05/22 0900  Weight: 83 kg   Physical Examination: Physical exam: General: Crtitically ill-appearing female, orally intubated HEENT: [place]/AT, eyes anicteric.  ETT and OGT in place Neuro: Eyes closed, does not open, not following commands, positive cough and gag moving bilateral lower extremities Chest: Coarse breath sounds, no wheezes or rhonchi Heart: Regular rate and rhythm, strong systolic murmur best heard in aortic area Abdomen: Soft, nondistended, bowel sounds present Skin: No rash, chronic venous stasis changes in bilateral lower extremities   Resolved Hospital Problem List:    Assessment & Plan:  Acute left MCA stroke s/p mechanical thrombectomy with TICI 3 Continue neuro watch Patient is not waking up despite being off sedation, tolerating spontaneous breathing trial Stroke team is following Continue secondary stroke prophylaxis MRI brain is pending Will get echocardiogram Postextubation PT/OT/SLP eval Continue aspirin and Plavix  Acute respiratory insufficiency, postprocedure Patient is tolerating supportive breathing trial VAP prevention bundle in place Off sedation Will try to give her chance of extubation after MRI  Hypertension, poorly controlled Moderate to severe aortic stenosis patient Chronic HFpEF Patient is currently on clevidipine infusion Hold oral antihypertensives Prior echocardiogram showed moderate to severe  aortic stenosis with diastolic dysfunction Echocardiogram is pending now Monitor intake and output  Poorly controlled diabetes with hyperglycemia Hemoglobin A1c 9.6% Blood sugars are better controlled after starting Semglee with sliding scale Monitor fingerstick with goal CBG 140-180  Obesity Counseling regarding diet and exercise postextubation  Best Practice: (right click and "Reselect all SmartList Selections" daily)   Diet/type: NPO SLP post extubation DVT prophylaxis: SCDs GI prophylaxis: PPI Lines: Arterial Line Foley:  Yes, and it is still needed, discontinue after extubation Code Status:  full code Last date of multidisciplinary goals of care discussion [Per primary team]  Labs:  CBC: Recent Labs  Lab 05/05/22 1000 05/05/22 1011 05/05/22 1400 05/06/22 0505  WBC 5.5  --   --  9.3  NEUTROABS 3.5  --   --  6.7  HGB 13.9 13.3 12.9 12.6  HCT 39.7 39.0 38.0 37.7  MCV 90.6  --   --  91.1  PLT 221  --   --  789   Basic Metabolic Panel: Recent Labs  Lab 05/05/22 1011 05/05/22 1152 05/05/22 1400 05/06/22 0505  NA 138 136 138 137  K 5.3* 3.8 3.8 3.6  CL 102 99  --  105  CO2  --  25  --  22  GLUCOSE 260* 284*  --  133*  BUN 13 9  --  7*  CREATININE 0.50 0.59  --  0.66  CALCIUM  --  8.6*  --  8.1*   GFR: Estimated Creatinine Clearance: 50.4 mL/min (by C-G formula based on SCr of 0.66 mg/dL). Recent Labs  Lab 05/05/22 1000 05/06/22 0505  WBC 5.5 9.3   Liver Function Tests: Recent Labs  Lab 05/05/22 1152  AST 17  ALT 15  ALKPHOS 57  BILITOT 0.2*  PROT 6.8  ALBUMIN 3.4*   No results for input(s): "LIPASE", "AMYLASE" in the last 168 hours. No results for input(s): "AMMONIA" in the last 168 hours.  ABG:    Component Value Date/Time   PHART 7.345 (L) 05/05/2022 1400   PCO2ART 45.7 05/05/2022 1400   PO2ART 100 05/05/2022 1400   HCO3 25.1 05/05/2022 1400   TCO2 26 05/05/2022 1400   ACIDBASEDEF 1.0 05/05/2022 1400   O2SAT 97 05/05/2022 1400     Coagulation Profile: Recent Labs  Lab 05/05/22 1000  INR 1.1   Cardiac Enzymes: No results for input(s): "CKTOTAL", "CKMB", "CKMBINDEX", "TROPONINI" in the last 168 hours.  HbA1C: Hgb A1c MFr Bld  Date/Time Value Ref Range Status  05/05/2022 10:00 AM 9.6 (H) 4.8 - 5.6 % Final    Comment:    (NOTE) Pre diabetes:          5.7%-6.4%  Diabetes:              >6.4%  Glycemic control for   <7.0% adults with diabetes   12/29/2017 04:48 PM 9.1 (H) 4.6 - 6.5 % Final    Comment:    Glycemic Control Guidelines for People with Diabetes:Non Diabetic:  <6%Goal of Therapy: <7%Additional Action Suggested:  >8%    CBG: Recent Labs  Lab 05/05/22 1303 05/05/22 1557 05/05/22 1914 05/05/22 2307 05/06/22 0318  GLUCAP 287* 346* 209* 131* 132*    This patient is critically ill with multiple organ system failure which requires frequent high complexity decision making, assessment, support, evaluation, and titration of therapies. This was  completed through the application of advanced monitoring technologies and extensive interpretation of multiple databases.  During this encounter critical care time was devoted to patient care services described in this note for 39 minutes.    Jacky Kindle, MD [facility] See Amion for pager If no response to pager, please call [phone] until 7pm After 7pm, Please call E-link [phone]

## 2022-05-06 NOTE — Progress Notes (Signed)
PT Cancellation Note  Patient Details Name: [name] MRN: [id number] DOB: [birth year]   Cancelled Treatment:    Reason Eval/Treat Not Completed: Patient not medically ready.  Hold per RN, weaning from vent, low arousal, not follow commands. 05/06/2022  Maria Carne., PT Acute Rehabilitation Services [phone]  (office)   [name] 05/06/2022, 10:29 AM

## 2022-05-06 NOTE — Progress Notes (Signed)
SLP Cancellation Note  Patient Details Name: [name] MRN: [id number] DOB: [birth year]   Cancelled treatment:       Reason Eval/Treat Not Completed: Medical issues which prohibited therapy (Patient remains intubated. Will f/u next date.)  Gabriel Rainwater MA, [place] 05/06/2022, 10:03 AM

## 2022-05-06 NOTE — Progress Notes (Signed)
Referring Physician(s): Dr Amada Jupiter  Supervising Physician: Julieanne Cotton  Patient Status:  [facility] - In-pt  Chief Complaint:  CVA Occluded left middle cerebral artery M1 segment.  Subjective:   2/6 IR procedure: Status post complete  revascularization of occluded left middle cerebral artery M1 segment with 1 pass with an 070 free climb aspiration catheter rate proximal flow arrest achieving a TICI 3 revascularization.  Intubated No response Sedation off 30 minutes ago Does not follow commands   Allergies: Aspirin  Medications: Prior to Admission medications   Medication Sig Start Date End Date Taking? Authorizing Provider  glipiZIDE (GLUCOTROL) 10 MG tablet Take 10 mg by mouth 2 (two) times daily before a meal.   Yes [provider]  lisinopril (ZESTRIL) 20 MG tablet Take 20 mg by mouth daily.   Yes [provider]  ACCU-CHEK AVIVA PLUS test strip USE AS DIRECTED TO TEST BLOOD SUGAR ONCE DAILY 04/06/18   [provider]  blood glucose meter kit and supplies Dispense based on patient and insurance preference. Use up to four times daily as directed. (FOR ICD-10 E10.9, E11.9). 10/27/17   Hedges, Tinnie Gens, PA-C     Vital Signs: BP (!) 116/54   Pulse 82   Temp (!) 101 F (38.3 C) (Axillary)   Resp (!) 22   Ht 5\' 2"  (1.575 m)   Wt 182 lb 15.7 oz (83 kg)   SpO2 96%   BMI 33.47 kg/m   Physical Exam Musculoskeletal:     Comments: Does move Rt shoulder to pain No other response seen  Skin:    Comments: Rt groin NT no bleeding No hematoma Rt foot doppler pulses      Imaging: IR PERCUTANEOUS ART THROMBECTOMY/INFUSION INTRACRANIAL INC DIAG ANGIO  Result Date: 05/06/2022 INDICATION: New onset right-sided weakness. Occluded left middle cerebral artery M1 segment on CT angiogram of the head and neck. EXAM: 1. EMERGENT LARGE VESSEL OCCLUSION THROMBOLYSIS (anterior CIRCULATION) COMPARISON:  CT angiogram of the head and neck of May 05, 2022. MEDICATIONS: Ancef 2 g IV antibiotic was administered within 1 hour of the procedure. ANESTHESIA/SEDATION: General anesthesia. CONTRAST:  Omnipaque 300 50 mL. FLUOROSCOPY TIME:  Fluoroscopy Time: 8 minutes 24 seconds (890 mGy). COMPLICATIONS: None immediate. TECHNIQUE: Following a full explanation of the procedure along with the potential associated complications, an informed witnessed consent was obtained. The risks of intracranial hemorrhage of 10%, worsening neurological deficit, ventilator dependency, death and inability to revascularize were all reviewed in detail with the patient's son. The patient was then put under general anesthesia by the Department of Anesthesiology at [facility]. The right groin was prepped and draped in the usual sterile fashion. Thereafter using modified Seldinger technique, transfemoral access into the right common femoral artery was obtained without difficulty. Over a 0.035 inch guidewire an 8 [facility] Pinnacle sheath was inserted. Through this, and also over a 0.035 inch guidewire a combination of a 5 French 125 cm Simmons 2 support catheter inside of an 087 95 cm balloon guide catheter was advanced to the aortic arch, and selectively positioned in the mid left common carotid artery. An arteriogram was then performed centered extra cranially and intracranially. The combination was then advanced to the distal cervical left ICA. The support Simmons 2 catheter, and the guidewire were removed. Good aspiration obtained from the hub of the balloon guide catheter. A gentle control arteriogram performed through the balloon guide catheter demonstrated no evidence of spasms, dissections or of intraluminal filling defects. FINDINGS: Intracranially  an the occluded left M1 segment was confirmed. Patency of the left anterior cerebral artery intact. Left external carotid artery origin and branches demonstrate wide patency. PROCEDURE: Through the balloon guide catheter in the distal  cervical left ICA, a combination of an 070 aspiration catheter with its inner support catheter were advanced using biplane roadmap technique and constant fluoroscopic guidance to the supraclinoid left ICA. Combination was then gently advanced into the occluded left middle cerebral artery distal M1 segment followed by the aspiration catheter. The inner support catheter was removed, and the 070 aspiration catheter was engaged into the M1 clot. Constant aspiration was then applied at the hub of the aspiration catheter with the Penumbra aspiration device and also at the hub of the balloon guide catheter with proximal flow arrest for a minute and a half. Thereafter, the combination of the 070 aspiration catheter was retrieved and removed. Following reversal of flow arrest in the left internal carotid artery, a control arteriogram performed demonstrated complete revascularization of the left MCA distribution achieving a TICI 3 revascularization. The left anterior cerebral artery remained widely patent. Mid cervical left ICA spasm was treated with 3 aliquots of 25 mcg of nitroglycerin with relief. A final control arteriogram performed through the balloon guide catheter in the left common carotid artery demonstrate wide patency of the left internal carotid artery extra cranially and intracranially. The left MCA maintained a TICI 3 revascularization. The balloon catheter was removed. An 8 French Angio-Seal closure device was deployed at the right groin puncture site. Distal pulses remained Dopplerable unchanged. Flat panel CT of the brain demonstrated no evidence of intracranial hemorrhage. Mild contrast stain of the posterior aspect of the left putamen was noted. Patient was left intubated due to her medical condition. She was transferred to the neuro ICU for post revascularization management. IMPRESSION: Status post complete revascularization of occluded left middle cerebral M1 segment with 1 pass with 070 FreeClimb  aspiration catheter with proximal flow arrest retrieving a TICI 3 revascularization. PLAN: As per referring MD. Electronically Signed   By: Luanne Bras M.D.   On: 05/06/2022 08:09   IR CT Head Ltd  Result Date: 05/06/2022 INDICATION: New onset right-sided weakness. Occluded left middle cerebral artery M1 segment on CT angiogram of the head and neck. EXAM: 1. EMERGENT LARGE VESSEL OCCLUSION THROMBOLYSIS (anterior CIRCULATION) COMPARISON:  CT angiogram of the head and neck of May 05, 2022. MEDICATIONS: Ancef 2 g IV antibiotic was administered within 1 hour of the procedure. ANESTHESIA/SEDATION: General anesthesia. CONTRAST:  Omnipaque 300 50 mL. FLUOROSCOPY TIME:  Fluoroscopy Time: 8 minutes 24 seconds (890 mGy). COMPLICATIONS: None immediate. TECHNIQUE: Following a full explanation of the procedure along with the potential associated complications, an informed witnessed consent was obtained. The risks of intracranial hemorrhage of 10%, worsening neurological deficit, ventilator dependency, death and inability to revascularize were all reviewed in detail with the patient's son. The patient was then put under general anesthesia by the Department of Anesthesiology at [facility]. The right groin was prepped and draped in the usual sterile fashion. Thereafter using modified Seldinger technique, transfemoral access into the right common femoral artery was obtained without difficulty. Over a 0.035 inch guidewire an 8 [place] Pinnacle sheath was inserted. Through this, and also over a 0.035 inch guidewire a combination of a 5 French 125 cm Simmons 2 support catheter inside of an 087 95 cm balloon guide catheter was advanced to the aortic arch, and selectively positioned in the mid left common carotid artery. An  arteriogram was then performed centered extra cranially and intracranially. The combination was then advanced to the distal cervical left ICA. The support Simmons 2 catheter, and the guidewire  were removed. Good aspiration obtained from the hub of the balloon guide catheter. A gentle control arteriogram performed through the balloon guide catheter demonstrated no evidence of spasms, dissections or of intraluminal filling defects. FINDINGS: Intracranially an the occluded left M1 segment was confirmed. Patency of the left anterior cerebral artery intact. Left external carotid artery origin and branches demonstrate wide patency. PROCEDURE: Through the balloon guide catheter in the distal cervical left ICA, a combination of an 070 aspiration catheter with its inner support catheter were advanced using biplane roadmap technique and constant fluoroscopic guidance to the supraclinoid left ICA. Combination was then gently advanced into the occluded left middle cerebral artery distal M1 segment followed by the aspiration catheter. The inner support catheter was removed, and the 070 aspiration catheter was engaged into the M1 clot. Constant aspiration was then applied at the hub of the aspiration catheter with the Penumbra aspiration device and also at the hub of the balloon guide catheter with proximal flow arrest for a minute and a half. Thereafter, the combination of the 070 aspiration catheter was retrieved and removed. Following reversal of flow arrest in the left internal carotid artery, a control arteriogram performed demonstrated complete revascularization of the left MCA distribution achieving a TICI 3 revascularization. The left anterior cerebral artery remained widely patent. Mid cervical left ICA spasm was treated with 3 aliquots of 25 mcg of nitroglycerin with relief. A final control arteriogram performed through the balloon guide catheter in the left common carotid artery demonstrate wide patency of the left internal carotid artery extra cranially and intracranially. The left MCA maintained a TICI 3 revascularization. The balloon catheter was removed. An 8 French Angio-Seal closure device was deployed  at the right groin puncture site. Distal pulses remained Dopplerable unchanged. Flat panel CT of the brain demonstrated no evidence of intracranial hemorrhage. Mild contrast stain of the posterior aspect of the left putamen was noted. Patient was left intubated due to her medical condition. She was transferred to the neuro ICU for post revascularization management. IMPRESSION: Status post complete revascularization of occluded left middle cerebral M1 segment with 1 pass with 070 FreeClimb aspiration catheter with proximal flow arrest retrieving a TICI 3 revascularization. PLAN: As per referring MD. Electronically Signed   By: Julieanne Cotton M.D.   On: 05/06/2022 08:09   IR ANGIO INTRA EXTRACRAN SEL INTERNAL CAROTID UNI L MOD SED  Result Date: 05/06/2022 INDICATION: New onset right-sided weakness. Occluded left middle cerebral artery M1 segment on CT angiogram of the head and neck. EXAM: 1. EMERGENT LARGE VESSEL OCCLUSION THROMBOLYSIS (anterior CIRCULATION) COMPARISON:  CT angiogram of the head and neck of May 05, 2022. MEDICATIONS: Ancef 2 g IV antibiotic was administered within 1 hour of the procedure. ANESTHESIA/SEDATION: General anesthesia. CONTRAST:  Omnipaque 300 50 mL. FLUOROSCOPY TIME:  Fluoroscopy Time: 8 minutes 24 seconds (890 mGy). COMPLICATIONS: None immediate. TECHNIQUE: Following a full explanation of the procedure along with the potential associated complications, an informed witnessed consent was obtained. The risks of intracranial hemorrhage of 10%, worsening neurological deficit, ventilator dependency, death and inability to revascularize were all reviewed in detail with the patient's son. The patient was then put under general anesthesia by the Department of Anesthesiology at [facility]. The right groin was prepped and draped in the usual sterile fashion. Thereafter using modified Seldinger technique,  transfemoral access into the right common femoral artery was obtained without  difficulty. Over a 0.035 inch guidewire an 8 [place] Pinnacle sheath was inserted. Through this, and also over a 0.035 inch guidewire a combination of a 5 French 125 cm Simmons 2 support catheter inside of an 087 95 cm balloon guide catheter was advanced to the aortic arch, and selectively positioned in the mid left common carotid artery. An arteriogram was then performed centered extra cranially and intracranially. The combination was then advanced to the distal cervical left ICA. The support Simmons 2 catheter, and the guidewire were removed. Good aspiration obtained from the hub of the balloon guide catheter. A gentle control arteriogram performed through the balloon guide catheter demonstrated no evidence of spasms, dissections or of intraluminal filling defects. FINDINGS: Intracranially an the occluded left M1 segment was confirmed. Patency of the left anterior cerebral artery intact. Left external carotid artery origin and branches demonstrate wide patency. PROCEDURE: Through the balloon guide catheter in the distal cervical left ICA, a combination of an 070 aspiration catheter with its inner support catheter were advanced using biplane roadmap technique and constant fluoroscopic guidance to the supraclinoid left ICA. Combination was then gently advanced into the occluded left middle cerebral artery distal M1 segment followed by the aspiration catheter. The inner support catheter was removed, and the 070 aspiration catheter was engaged into the M1 clot. Constant aspiration was then applied at the hub of the aspiration catheter with the Penumbra aspiration device and also at the hub of the balloon guide catheter with proximal flow arrest for a minute and a half. Thereafter, the combination of the 070 aspiration catheter was retrieved and removed. Following reversal of flow arrest in the left internal carotid artery, a control arteriogram performed demonstrated complete revascularization of the left MCA  distribution achieving a TICI 3 revascularization. The left anterior cerebral artery remained widely patent. Mid cervical left ICA spasm was treated with 3 aliquots of 25 mcg of nitroglycerin with relief. A final control arteriogram performed through the balloon guide catheter in the left common carotid artery demonstrate wide patency of the left internal carotid artery extra cranially and intracranially. The left MCA maintained a TICI 3 revascularization. The balloon catheter was removed. An 8 French Angio-Seal closure device was deployed at the right groin puncture site. Distal pulses remained Dopplerable unchanged. Flat panel CT of the brain demonstrated no evidence of intracranial hemorrhage. Mild contrast stain of the posterior aspect of the left putamen was noted. Patient was left intubated due to her medical condition. She was transferred to the neuro ICU for post revascularization management. IMPRESSION: Status post complete revascularization of occluded left middle cerebral M1 segment with 1 pass with 070 FreeClimb aspiration catheter with proximal flow arrest retrieving a TICI 3 revascularization. PLAN: As per referring MD. Electronically Signed   By: Luanne Bras M.D.   On: 05/06/2022 08:09   DG CHEST PORT 1 VIEW  Result Date: 05/05/2022 CLINICAL DATA:  Heart failure EXAM: PORTABLE CHEST 1 VIEW COMPARISON:  10/14/2017 FINDINGS: Endotracheal tube terminates 3.0 cm above the carina. Mild cardiomegaly. Aortic atherosclerosis. Streaky left basilar opacity with probable small left pleural effusion. No pneumothorax. IMPRESSION: 1. Endotracheal tube terminates 3.0 cm above the carina. 2. Streaky left basilar opacity with probable small left pleural effusion. Electronically Signed   By: Davina Poke D.O.   On: 05/05/2022 12:21   CT ANGIO HEAD NECK W WO CM W PERF (CODE STROKE)  Result Date: 05/05/2022 CLINICAL DATA:  Stroke  suspected EXAM: CT ANGIOGRAPHY HEAD AND NECK CT PERFUSION BRAIN TECHNIQUE:  Multidetector CT imaging of the head and neck was performed using the standard protocol during bolus administration of intravenous contrast. Multiplanar CT image reconstructions and MIPs were obtained to evaluate the vascular anatomy. Carotid stenosis measurements (when applicable) are obtained utilizing NASCET criteria, using the distal internal carotid diameter as the denominator. Multiphase CT imaging of the brain was performed following IV bolus contrast injection. Subsequent parametric perfusion maps were calculated using RAPID software. RADIATION DOSE REDUCTION: This exam was performed according to the departmental dose-optimization program which includes automated exposure control, adjustment of the mA and/or kV according to patient size and/or use of iterative reconstruction technique. CONTRAST:  69mL OMNIPAQUE IOHEXOL 350 MG/ML SOLN COMPARISON:  Same day CT head FINDINGS: CT HEAD FINDINGS See same day CT brain for findings left MCA territory infarct. CTA NECK FINDINGS Aortic arch: Standard branching. Imaged portion shows no evidence of aneurysm or dissection. No significant stenosis of the major arch vessel origins. There is nonspecific irregular narrowing of the distal left subclavian/proximal axillary artery. Right carotid system: No evidence of dissection, stenosis (50% or greater) or occlusion. Left carotid system: No evidence of dissection, stenosis (50% or greater) or occlusion. Vertebral arteries: Severe stenosis of the V3 V4 junction of the left vertebral artery (series 8, image 142). Skeleton: Multilevel degenerative changes in the cervical spine. Other neck: Carious dentition multiple dental caries and periapical lucencies. No evidence of an odontogenic soft tissue abscess. Patient is edentulous along the maxillary arch. Upper chest: Biapical bronchial wall thickening as can be seen in the setting of small airways disease or bronchitis. Review of the MIP images confirms the above findings CTA HEAD  FINDINGS Anterior circulation: Common origin of the bilateral A2 segments with the A1 segment arising from the [place]. There is an acute occlusion of the proximal to mid portion of the left M1 (series 8, image 85). There is poor opacification of the M2 segments in the left MCA territory, which may be secondary to decreased flow in the setting of nearly occlusive thrombus or additional small scattered thrombi in the left MCA territory. Mild-to-moderate narrowing is also present in the distal M1 segment of the right MCA. Posterior circulation: There is severe stenosis of the vertebrobasilar junction and in the proximal basilar artery. The P1 segment right PCA small in caliber. There are multifocal stenoses in the bilateral PCA territory of the P2 and P3 segments. Venous sinuses: Not well opacified Anatomic variants: None Review of the MIP images confirms the above findings CT Brain Perfusion Findings: ASPECTS: 5-6 CBF (<30%) Volume: 50mL Perfusion (Tmax>6.0s) volume: 133mL Mismatch Volume: 3.45mL Infarction Location:Left MCA territory IMPRESSION: 1. Acute occlusion of the proximal to mid portion of the left M1 segment. Poor opacification of the M2 segments in the left MCA territory may be secondary to decreased flow in the setting of occlusive thrombus versus additional small scattered thrombi in the distal left MCA territory. Perfusion findings, as above. 2. 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 3. Multifocal mild to moderate stenoses in the bilateral PCA territory. 4. Severe stenosis at the V3/V4 junction of the left vertebral artery. 5. No hemodynamically significant stenosis in the neck. Findings were paged to Dr. Leonel Ramsay on 05/05/22 at 9:36 AM via [facility] paging system. Electronically Signed   By: Marin Roberts M.D.   On: 05/05/2022 09:59   CT HEAD CODE STROKE WO CONTRAST  Result Date: 05/05/2022 CLINICAL DATA:  Code stroke.  Left facial droop EXAM: CT HEAD WITHOUT  CONTRAST TECHNIQUE: Contiguous axial images were obtained from the base of the skull through the vertex without intravenous contrast. RADIATION DOSE REDUCTION: This exam was performed according to the departmental dose-optimization program which includes automated exposure control, adjustment of the mA and/or kV according to patient size and/or use of iterative reconstruction technique. COMPARISON:  CT Head 01/23/22 FINDINGS: Brain: There is an acute infarct in the left MCA territory involving the left lentiform nucleus, left insula and M1-M3 cortices. Aspects is 5-6. No hemorrhage. No hydrocephalus. No extra-axial fluid collection. No mass lesion. Vascular: See same day CT head and neck angiogram for additional findings. No hyperdense vessel is visualized. Skull: Normal. Negative for fracture or focal lesion. Sinuses/Orbits: No mastoid or middle ear effusion. There are frothy secretions in the sphenoid sinus, which can be seen in the setting acute sinusitis. Bilateral lens replacement. Orbits are otherwise unremarkable. Other: None. ASPECTS [facility] Stroke Program Early CT Score) - Ganglionic level infarction (caudate, lentiform nuclei, internal capsule, insula, M1-M3 cortex): 3 - Supraganglionic infarction (M4-M6 cortex): 2-3 Total score (0-10 with 10 being normal): 5-6 IMPRESSION: Acute infarct in the left MCA territory. Aspects is 5-6. No hemorrhage. Findings were paged to Dr. Amada Jupiter on 05/05/22 at 9:36 AM via [facility] paging system. Electronically Signed   By: Lorenza Cambridge M.D.   On: 05/05/2022 09:44    Labs:  CBC: Recent Labs    05/05/22 1000 05/05/22 1011 05/05/22 1400 05/06/22 0505  WBC 5.5  --   --  9.3  HGB 13.9 13.3 12.9 12.6  HCT 39.7 39.0 38.0 37.7  PLT 221  --   --  203    COAGS: Recent Labs    05/05/22 1000  INR 1.1  APTT 26    BMP: Recent Labs    05/05/22 1011 05/05/22 1152 05/05/22 1400 05/06/22 0505  NA 138 136 138 137  K 5.3* 3.8 3.8 3.6  CL 102 99  --  105  CO2   --  25  --  22  GLUCOSE 260* 284*  --  133*  BUN 13 9  --  7*  CALCIUM  --  8.6*  --  8.1*  CREATININE 0.50 0.59  --  0.66  GFRNONAA  --  >60  --  >60    LIVER FUNCTION TESTS: Recent Labs    05/05/22 1152  BILITOT 0.2*  AST 17  ALT 15  ALKPHOS 57  PROT 6.8  ALBUMIN 3.4*    Assessment and Plan:  LMCA thrombectomy 05/05/22 NIR will follow with Stroke team   Electronically Signed: Robet Leu, PA-C 05/06/2022, 8:47 AM   I spent a total of 15 Minutes at the the patient's bedside AND on the patient's hospital floor or unit, greater than 50% of which was counseling/coordinating care for L MCA thrombectomy

## 2022-05-06 NOTE — Progress Notes (Signed)
Initial Nutrition Assessment  DOCUMENTATION CODES:   Not applicable  INTERVENTION:   Initiate tube feeds via Cortrak: - Start Jevity 1.2 @ 40 ml/hr and advance rate by 10 ml q 6 hours to goal rate of 60 ml/hr (1440 ml/day) - PROSource TF20 60 ml daily  Tube feeding regimen at goal rate provides 1808 kcal, 100 grams of protein, and 1162 ml of H2O.  - MVI with minerals daily per tube  NUTRITION DIAGNOSIS:   Inadequate oral intake related to inability to eat as evidenced by NPO status.  GOAL:   Patient will meet greater than or equal to 90% of their needs  MONITOR:   Vent status, Labs, Weight trends, TF tolerance, Skin  REASON FOR ASSESSMENT:   Ventilator, Consult Enteral/tube feeding initiation and management  ASSESSMENT:   88 year old female who presented to the ED on 2/06 as a Code Stroke. PMH of T2DM, HTN, emphysema. Pt admitted with left MCA stroke.  02/06 - s/p mechanical thrombectomy 02/07 - Cortrak placement  Discussed pt with RN and with Neurology NP. MRI this morning shows L MCA territory infarct with some hemorrhagic transformation. Consult received for tube feeding initiation and management. Cortrak placed today by Cortrak team.  Unable to obtain diet and weight history at this time. Reviewed weight history in chart. Weight up 6 kg compared to last available weight from 10/27/19.  Patient is currently intubated on ventilator support MV: 7.7 L/min Temp (24hrs), Avg:100.9 F (38.3 C), Min:99.3 F (37.4 C), Max:102.6 F (39.2 C)  Drips: Cleviprex: 8 mg/hr (provides 768 kcal daily from lipid)  Medications reviewed and include: colace, SSI q 4 hours, semglee 10 units daily, IV protonix, miralax  Labs reviewed: LDL 112, TG 177, hemoglobin A1C 9.6 CBG's: 131-346 x 24 hours  UOP: 2050 ml x 24 hours I/O's: +1.8 L since admit  NUTRITION - FOCUSED PHYSICAL EXAM:  Flowsheet Row Most Recent Value  Orbital Region Mild depletion  Upper Arm Region No  depletion  Thoracic and Lumbar Region No depletion  Buccal Region Unable to assess  Temple Region Mild depletion  Clavicle Bone Region Mild depletion  Clavicle and Acromion Bone Region Mild depletion  Scapular Bone Region No depletion  Dorsal Hand No depletion  Patellar Region No depletion  Anterior Thigh Region No depletion  Posterior Calf Region No depletion  Edema (RD Assessment) Mild  Hair Reviewed  Eyes Reviewed  Mouth Reviewed  Skin Reviewed  Nails Reviewed       Diet Order:   Diet Order             Diet NPO time specified  Diet effective now                   EDUCATION NEEDS:   Not appropriate for education at this time  Skin:  Skin Assessment: Skin Integrity Issues: Stage II: L buttocks  Last BM:  no documented BM  Height:   Ht Readings from Last 1 Encounters:  05/05/22 5\' 2"  (1.575 m)    Weight:   Wt Readings from Last 1 Encounters:  05/05/22 83 kg    Ideal Body Weight:  50 kg  BMI:  Body mass index is 33.47 kg/m.  Estimated Nutritional Needs:   Kcal:  1700-1900  Protein:  85-100 grams  Fluid:  1.7-1.9 L    Gustavus Bryant, MS, RD, LDN Inpatient Clinical Dietitian Please see AMiON for contact information.

## 2022-05-07 ENCOUNTER — Encounter (HOSPITAL_COMMUNITY): Payer: Self-pay | Admitting: Radiology

## 2022-05-07 DIAGNOSIS — Z7189 Other specified counseling: Secondary | ICD-10-CM

## 2022-05-07 DIAGNOSIS — I5032 Chronic diastolic (congestive) heart failure: Secondary | ICD-10-CM | POA: Diagnosis not present

## 2022-05-07 DIAGNOSIS — J9601 Acute respiratory failure with hypoxia: Secondary | ICD-10-CM

## 2022-05-07 DIAGNOSIS — I639 Cerebral infarction, unspecified: Secondary | ICD-10-CM | POA: Diagnosis not present

## 2022-05-07 DIAGNOSIS — Z515 Encounter for palliative care: Secondary | ICD-10-CM

## 2022-05-07 LAB — CBC
HCT: 33.4 % — ABNORMAL LOW (ref 36.0–46.0)
Hemoglobin: 11.7 g/dL — ABNORMAL LOW (ref 12.0–15.0)
MCH: 31.3 pg (ref 26.0–34.0)
MCHC: 35 g/dL (ref 30.0–36.0)
MCV: 89.3 fL (ref 80.0–100.0)
Platelets: 157 10*3/uL (ref 150–400)
RBC: 3.74 MIL/uL — ABNORMAL LOW (ref 3.87–5.11)
RDW: 12.2 % (ref 11.5–15.5)
WBC: 7.7 10*3/uL (ref 4.0–10.5)
nRBC: 0 % (ref 0.0–0.2)

## 2022-05-07 LAB — PHOSPHORUS
Phosphorus: 2.6 mg/dL (ref 2.5–4.6)
Phosphorus: 2 mg/dL — ABNORMAL LOW (ref 2.5–4.6)

## 2022-05-07 LAB — GLUCOSE, CAPILLARY
Glucose-Capillary: 236 mg/dL — ABNORMAL HIGH (ref 70–99)
Glucose-Capillary: 312 mg/dL — ABNORMAL HIGH (ref 70–99)
Glucose-Capillary: 261 mg/dL — ABNORMAL HIGH (ref 70–99)
Glucose-Capillary: 241 mg/dL — ABNORMAL HIGH (ref 70–99)
Glucose-Capillary: 238 mg/dL — ABNORMAL HIGH (ref 70–99)
Glucose-Capillary: 291 mg/dL — ABNORMAL HIGH (ref 70–99)

## 2022-05-07 LAB — BASIC METABOLIC PANEL
Anion gap: 9 (ref 5–15)
BUN: 12 mg/dL (ref 8–23)
CO2: 25 mmol/L (ref 22–32)
Calcium: 8 mg/dL — ABNORMAL LOW (ref 8.9–10.3)
Chloride: 101 mmol/L (ref 98–111)
Creatinine, Ser: 0.64 mg/dL (ref 0.44–1.00)
GFR, Estimated: >60 mL/min (ref >60)
Glucose, Bld: 305 mg/dL — ABNORMAL HIGH (ref 70–99)
Potassium: 3.5 mmol/L (ref 3.5–5.1)
Sodium: 135 mmol/L (ref 135–145)

## 2022-05-07 LAB — MAGNESIUM
Magnesium: 1.9 mg/dL (ref 1.7–2.4)
Magnesium: 2.1 mg/dL (ref 1.7–2.4)

## 2022-05-07 MED ORDER — CARVEDILOL 3.125 MG PO TABS
6.2500 mg | ORAL_TABLET | Freq: Two times a day (BID) | ORAL | Status: DC
  Administered 2022-05-07 – 2022-05-08 (×3): 6.25 mg
  Filled 2022-05-07 (×3): qty 2

## 2022-05-07 MED ORDER — SODIUM CHLORIDE 0.9 % IV SOLN
3.0000 g | Freq: Four times a day (QID) | INTRAVENOUS | Status: AC
  Administered 2022-05-07 – 2022-05-14 (×28): 3 g via INTRAVENOUS
  Filled 2022-05-07 (×28): qty 8

## 2022-05-07 MED ORDER — INSULIN GLARGINE-YFGN 100 UNIT/ML ~~LOC~~ SOLN
10.0000 [IU] | Freq: Two times a day (BID) | SUBCUTANEOUS | Status: DC
  Administered 2022-05-07 (×2): 10 [IU] via SUBCUTANEOUS
  Filled 2022-05-07 (×4): qty 0.1

## 2022-05-07 MED ORDER — LISINOPRIL 20 MG PO TABS
20.0000 mg | ORAL_TABLET | Freq: Every day | ORAL | Status: DC
  Administered 2022-05-07 – 2022-05-16 (×8): 20 mg
  Filled 2022-05-07 (×10): qty 1

## 2022-05-07 MED ORDER — INSULIN ASPART 100 UNIT/ML IJ SOLN
2.0000 [IU] | INTRAMUSCULAR | Status: DC
  Administered 2022-05-07 – 2022-05-08 (×7): 2 [IU] via SUBCUTANEOUS

## 2022-05-07 NOTE — Progress Notes (Signed)
OT Cancellation Note  Patient Details Name: Novah Nessel MRN: [id number] DOB: 25-Feb-1935   Cancelled Treatment:    Reason Eval/Treat Not Completed: Medical issues which prohibited therapy.  Pt not medically appropriate for therapy at this time.  Will check back tomorrow and proceed with eval if pt is appropriate and goals of care are decided with family.    Clyda Greener, OTR/L Acute Rehabilitation Services  Office [phone] 05/07/2022

## 2022-05-07 NOTE — Progress Notes (Signed)
NAME:  [name], MRN:  [id number], DOB:  1934/06/15, LOS: 2 ADMISSION DATE:  05/05/2022 CONSULTATION DATE:  05/05/2022 REFERRING MD:  Leonel Ramsay - Neuro CHIEF COMPLAINT:  Code Stroke, MV post-L MCA occlusion   History of Present Illness:  88 year old woman who presented to [facility] ED 2/6 as Code Stroke. Presented with R-sided weakness/neglect. LKW 0030 2/6. PMHx significant for HTN, emphysema, T2DM, thyroid disease.  Presented to ED as Code Stroke; noted to have R facial droop, R-sided weakness and aphasia. Following some commands with LUE on ED arrival. LKW ~0030 2/6. CT Head negative for ICH. CTA Head/Neck with left MCA M1 occlusion, poor flow in M2 segments, ?low flow, severe vertebrobasilar stenosis, moderate bilateral PCA stenoses. Out of window for TNK. Taken to [facility] for mechanical thrombectomy with TICI3 revascularization. Left intubated post-procedure.   PCCM consulted for medical management  Pertinent Medical History:   Past Medical History:  Diagnosis Date   Diabetes mellitus without complication ([place])    Emphysema of lung ([place])    Hypertension    Thyroid disease    UTI (lower urinary tract infection)    Significant Hospital Events: Including procedures, antibiotic start and stop dates in addition to other pertinent events   2/6 - Presented to ED as Code Stroke. CT Head negative for ICH. CTA Head/Neck with left MCA M1 occlusion, poor flow in M2 segments, ?low flow, severe vertebrobasilar stenosis, moderate bilateral PCA stenoses. Out of window for TNK. Taken to [facility] for mechanical thrombectomy with TICI3 revascularization. Left intubated post-procedure. PCCM consulted for vent management.  Interim History / Subjective:  Patient continued to spike fever with with Tmax 102 She has copious amount of greenish secretions via ET tube Still not waking up MRI brain showed large left MCA stroke with hemorrhagic conversion  Objective:  Blood pressure (!) 123/47, pulse 64, temperature  98.6 F (37 C), temperature source Oral, resp. rate 18, height 5\' 2"  (1.575 m), weight 78 kg, SpO2 100 %.    Vent Mode: PSV;CPAP FiO2 (%):  [40 %] 40 % Set Rate:  [18 bmp] 18 bmp Vt Set:  [400 mL] 400 mL PEEP:  [5 cmH20] 5 cmH20 Pressure Support:  [10 cmH20] 10 cmH20 Plateau Pressure:  [14 cmH20] 14 cmH20   Intake/Output Summary (Last 24 hours) at 05/07/2022 0856 Last data filed at 05/07/2022 0600 Gross per 24 hour  Intake 881.68 ml  Output 1475 ml  Net -593.32 ml   Filed Weights   05/05/22 0900 05/07/22 0500  Weight: 83 kg 78 kg   Physical Examination:   Physical exam: General: Crtitically ill-appearing female, orally intubated HEENT: [place]/AT, eyes anicteric.  ETT and OGT in place Neuro: Eyes closed, barely opens, not following commands, spontaneously moving left side, withdrawing on right side Chest: Crackles heard on right lower lung zone, no wheezes Heart: Regular rate and rhythm, no murmurs or gallops Abdomen: Soft, nondistended, bowel sounds present Skin: No rash.  Chronic venous stasis changes in bilateral lower extremities with outgrown nail and bilateral feet   Resolved Hospital Problem List:    Assessment & Plan:  Acute left MCA stroke s/p mechanical thrombectomy with TICI 3 Continue neuro watch Patient is not waking up despite being off sedation, tolerating spontaneous breathing trial Stroke team is following, goals of care discussions ongoing Continue secondary stroke prophylaxis MRI brain confirmed large left MCA stroke with hemorrhagic conversion Holding antiplatelets in the setting of hemorrhagic conversion  Acute hypoxic respiratory failure Aspiration pneumonia Patient is tolerating supportive breathing trial,  mental status precludes extubation VAP prevention bundle in place Off sedation She had copious amount of thick secretions via ET tube, continue to spike fever with Tmax 102 Respiratory culture is showing abundant amount of WBCs, no  organisms Started on Unasyn  Hypertension, poorly controlled Moderate to severe aortic stenosis patient Chronic HFpEF Clevidipine infusion was titrated off Patient remained hypertensive Started back on lisinopril Added Coreg 6.25 mg twice daily  Echocardiogram is consistent with grade 2 diastolic dysfunction with ejection fraction 70-75%  Monitor intake and output  Poorly controlled diabetes with hyperglycemia Hemoglobin A1c 9.6% Patient blood sugars remain elevated into the 300s Increase Lantus to 10 units twice daily Added tube feed coverage 2 units every 4 hours Continue sliding scale insulin Monitor fingerstick with goal CBG 140-180  Obesity Counseling regarding diet and exercise postextubation  Best Practice: (right click and "Reselect all SmartList Selections" daily)   Diet/type: NPO start tube feeds DVT prophylaxis: SCDs GI prophylaxis: PPI Lines: Arterial Line Foley: Discontinue Code Status:  full code Last date of multidisciplinary goals of care discussion [Per primary team] ongoing goals of care discussion  Labs:  CBC: Recent Labs  Lab 05/05/22 1000 05/05/22 1011 05/05/22 1400 05/06/22 0505  WBC 5.5  --   --  9.3  NEUTROABS 3.5  --   --  6.7  HGB 13.9 13.3 12.9 12.6  HCT 39.7 39.0 38.0 37.7  MCV 90.6  --   --  91.1  PLT 221  --   --  782   Basic Metabolic Panel: Recent Labs  Lab 05/05/22 1011 05/05/22 1152 05/05/22 1400 05/06/22 0505 05/06/22 1832 05/07/22 0429  NA 138 136 138 137  --   --   K 5.3* 3.8 3.8 3.6  --   --   CL 102 99  --  105  --   --   CO2  --  25  --  22  --   --   GLUCOSE 260* 284*  --  133*  --   --   BUN 13 9  --  7*  --   --   CREATININE 0.50 0.59  --  0.66  --   --   CALCIUM  --  8.6*  --  8.1*  --   --   MG  --   --   --   --  1.9 1.9  PHOS  --   --   --   --  2.6 2.6   GFR: Estimated Creatinine Clearance: 48.8 mL/min (by C-G formula based on SCr of 0.66 mg/dL). Recent Labs  Lab 05/05/22 1000 05/06/22 0505  WBC  5.5 9.3   Liver Function Tests: Recent Labs  Lab 05/05/22 1152  AST 17  ALT 15  ALKPHOS 57  BILITOT 0.2*  PROT 6.8  ALBUMIN 3.4*   No results for input(s): "LIPASE", "AMYLASE" in the last 168 hours. No results for input(s): "AMMONIA" in the last 168 hours.  ABG:    Component Value Date/Time   PHART 7.345 (L) 05/05/2022 1400   PCO2ART 45.7 05/05/2022 1400   PO2ART 100 05/05/2022 1400   HCO3 25.1 05/05/2022 1400   TCO2 26 05/05/2022 1400   ACIDBASEDEF 1.0 05/05/2022 1400   O2SAT 97 05/05/2022 1400    Coagulation Profile: Recent Labs  Lab 05/05/22 1000  INR 1.1   Cardiac Enzymes: No results for input(s): "CKTOTAL", "CKMB", "CKMBINDEX", "TROPONINI" in the last 168 hours.  HbA1C: Hgb A1c MFr Bld  Date/Time Value Ref Range Status  05/05/2022 10:00 AM 9.6 (H) 4.8 - 5.6 % Final    Comment:    (NOTE) Pre diabetes:          5.7%-6.4%  Diabetes:              >6.4%  Glycemic control for   <7.0% adults with diabetes   12/29/2017 04:48 PM 9.1 (H) 4.6 - 6.5 % Final    Comment:    Glycemic Control Guidelines for People with Diabetes:Non Diabetic:  <6%Goal of Therapy: <7%Additional Action Suggested:  >8%    CBG: Recent Labs  Lab 05/06/22 1522 05/06/22 1911 05/06/22 2313 05/07/22 0319 05/07/22 0805  GLUCAP 179* 229* 255* 236* 312*    This patient is critically ill with multiple organ system failure which requires frequent high complexity decision making, assessment, support, evaluation, and titration of therapies. This was completed through the application of advanced monitoring technologies and extensive interpretation of multiple databases.  During this encounter critical care time was devoted to patient care services described in this note for 38 minutes.    Jacky Kindle, MD  Pulmonary Critical Care See Amion for pager If no response to pager, please call [phone] until 7pm After 7pm, Please call E-link [phone]

## 2022-05-07 NOTE — Progress Notes (Addendum)
STROKE TEAM PROGRESS NOTE   INTERVAL HISTORY Patient is seen in her room with no family at the bedside.  She remains in the ICU but has been hemodynamically stable.  MRI yesterday revealed large left MCA territory stroke with hemorrhagic transformation.  She is not yet ready for extubation, and goals of care conversations with family are ongoing.  Vitals:   05/07/22 1000 05/07/22 1100 05/07/22 1113 05/07/22 1200  BP: (!) 126/40 (!) 154/54  (!) 134/59  Pulse: 71 67  79  Resp:      Temp:      TempSrc:      SpO2: 100% 100% 100% 100%  Weight:      Height:       CBC:  Recent Labs  Lab 05/05/22 1000 05/05/22 1011 05/06/22 0505 05/07/22 0847  WBC 5.5  --  9.3 7.7  NEUTROABS 3.5  --  6.7  --   HGB 13.9   < > 12.6 11.7*  HCT 39.7   < > 37.7 33.4*  MCV 90.6  --  91.1 89.3  PLT 221  --  203 157   < > = values in this interval not displayed.    Basic Metabolic Panel:  Recent Labs  Lab 05/06/22 0505 05/06/22 1832 05/07/22 0429 05/07/22 0847  NA 137  --   --  135  K 3.6  --   --  3.5  CL 105  --   --  101  CO2 22  --   --  25  GLUCOSE 133*  --   --  305*  BUN 7*  --   --  12  CREATININE 0.66  --   --  0.64  CALCIUM 8.1*  --   --  8.0*  MG  --  1.9 1.9  --   PHOS  --  2.6 2.6  --     Lipid Panel:  Recent Labs  Lab 05/06/22 0505  CHOL 176  TRIG 172*  177*  HDL 30*  CHOLHDL 5.9  VLDL 34  LDLCALC 329*    HgbA1c:  Recent Labs  Lab 05/05/22 1000  HGBA1C 9.6*    Urine Drug Screen:  Recent Labs  Lab 05/05/22 1152  LABOPIA NONE DETECTED  COCAINSCRNUR NONE DETECTED  LABBENZ NONE DETECTED  AMPHETMU NONE DETECTED  THCU NONE DETECTED  LABBARB NONE DETECTED     Alcohol Level  Recent Labs  Lab 05/05/22 1000  ETH <10     IMAGING past 24 hours DG Abd Portable 1V  Result Date: 05/06/2022 CLINICAL DATA:  Feeding tube placement EXAM: PORTABLE ABDOMEN - 1 VIEW COMPARISON:  06/10/2012 FINDINGS: Limited radiograph of the lower chest and upper abdomen was  obtained for the purposes of enteric tube localization. Enteric tube is seen coursing below the diaphragm with distal tip terminating within the expected location of the gastric body. IMPRESSION: Enteric tube terminates within the expected location of the gastric body. Electronically Signed   By: Duanne Guess D.O.   On: 05/06/2022 15:32   ECHOCARDIOGRAM COMPLETE  Result Date: 05/06/2022    ECHOCARDIOGRAM REPORT   Patient Name:   Arlesia Kiel Date of Exam: 05/06/2022 Medical Rec #:  [id number]        Height:       62.0 in Accession #:    [id number]       Weight:       183.0 lb Date of Birth:  [birth year]        BSA:  1.841 m Patient Age:    88 years         BP:           131/59 mmHg Patient Gender: F                HR:           87 bpm. Exam Location:  Inpatient Procedure: 2D Echo, Cardiac Doppler, Color Doppler and 3D Echo Indications:    Stroke  History:        Patient has prior history of Echocardiogram examinations, most                 recent 11/15/2019. Stroke, Aortic Valve Disease; Risk                 Factors:Hypertension and Diabetes. Aortic stenosis.  Sonographer:    Sheralyn Boatman RDCS Referring Phys: MCNEILL P [facility]  Sonographer Comments: Technically difficult study due to poor echo windows. Patient supine on vent. Patient restricted probe position with left arm. Could not obtain optimal window. IMPRESSIONS  1. Left ventricular ejection fraction, by estimation, is 70 to 75%. The left ventricle has hyperdynamic function. The left ventricle has no regional wall motion abnormalities. There is moderate asymmetric left ventricular hypertrophy of the basal-septal  segment. Left ventricular diastolic parameters are consistent with Grade II diastolic dysfunction (pseudonormalization). Elevated left atrial pressure.  2. Right ventricular systolic function is normal. The right ventricular size is mildly enlarged. There is normal pulmonary artery systolic pressure.  3. The mitral valve is  degenerative. Trivial mitral valve regurgitation. Mild mitral stenosis. The mean mitral valve gradient is 4.0 mmHg with average heart rate of 82 bpm. Severe mitral annular calcification. MVA 1.9 cm^2 by continuity equation  4. The aortic valve is calcified. There is moderate calcification of the aortic valve. Aortic valve regurgitation is trivial. Moderate aortic valve stenosis. Vmax 3.6 m/s, MG 30 mmHg, AVA 1.3 cm^2, DI 0.35  5. The inferior vena cava is normal in size with greater than 50% respiratory variability, suggesting right atrial pressure of 3 mmHg. FINDINGS  Left Ventricle: Left ventricular ejection fraction, by estimation, is 70 to 75%. The left ventricle has hyperdynamic function. The left ventricle has no regional wall motion abnormalities. The left ventricular internal cavity size was normal in size. There is moderate asymmetric left ventricular hypertrophy of the basal-septal segment. Left ventricular diastolic parameters are consistent with Grade II diastolic dysfunction (pseudonormalization). Elevated left atrial pressure. Right Ventricle: The right ventricular size is mildly enlarged. Right vetricular wall thickness was not well visualized. Right ventricular systolic function is normal. There is normal pulmonary artery systolic pressure. The tricuspid regurgitant velocity  is 1.71 m/s, and with an assumed right atrial pressure of 3 mmHg, the estimated right ventricular systolic pressure is 14.7 mmHg. Left Atrium: Left atrial size was normal in size. Right Atrium: Right atrial size was normal in size. Pericardium: There is no evidence of pericardial effusion. Presence of epicardial fat layer. Mitral Valve: The mitral valve is degenerative in appearance. Severe mitral annular calcification. Trivial mitral valve regurgitation. Mild mitral valve stenosis. MV peak gradient, 13.2 mmHg. The mean mitral valve gradient is 4.0 mmHg with average heart rate of 82 bpm. Tricuspid Valve: The tricuspid valve is  normal in structure. Tricuspid valve regurgitation is trivial. Aortic Valve: The aortic valve is calcified. There is moderate calcification of the aortic valve. Aortic valve regurgitation is trivial. Moderate aortic stenosis is present. Aortic valve mean gradient measures 28.5 mmHg. Aortic  valve peak gradient measures 47.6 mmHg. Aortic valve area, by VTI measures 1.55 cm. Pulmonic Valve: The pulmonic valve was not well visualized. Pulmonic valve regurgitation is not visualized. Aorta: The aortic root and ascending aorta are structurally normal, with no evidence of dilitation. Venous: The inferior vena cava is normal in size with greater than 50% respiratory variability, suggesting right atrial pressure of 3 mmHg. IAS/Shunts: The interatrial septum was not well visualized.  LEFT VENTRICLE PLAX 2D LVIDd:         4.10 cm   Diastology LVIDs:         2.20 cm   LV e' medial:    4.19 cm/s LV PW:         1.25 cm   LV E/e' medial:  41.8 LV IVS:        1.40 cm   LV e' lateral:   5.70 cm/s LVOT diam:     2.20 cm   LV E/e' lateral: 30.7 LV SV:         97 LV SV Index:   53 LVOT Area:     3.80 cm  RIGHT VENTRICLE             IVC RV S prime:     14.20 cm/s  IVC diam: 2.00 cm TAPSE (M-mode): 1.7 cm LEFT ATRIUM             Index        RIGHT ATRIUM           Index LA diam:        3.40 cm 1.85 cm/m   RA Area:     18.50 cm LA Vol (A2C):   69.5 ml 37.76 ml/m  RA Volume:   49.10 ml  26.67 ml/m LA Vol (A4C):   38.0 ml 20.64 ml/m LA Biplane Vol: 54.0 ml 29.34 ml/m  AORTIC VALVE AV Area (Vmax):    1.47 cm AV Area (Vmean):   1.42 cm AV Area (VTI):     1.55 cm AV Vmax:           345.00 cm/s AV Vmean:          238.200 cm/s AV VTI:            0.626 m AV Peak Grad:      47.6 mmHg AV Mean Grad:      28.5 mmHg LVOT Vmax:         133.50 cm/s LVOT Vmean:        89.150 cm/s LVOT VTI:          0.256 m LVOT/AV VTI ratio: 0.41  AORTA Ao Root diam: 3.20 cm Ao Asc diam:  3.10 cm MITRAL VALVE                TRICUSPID VALVE MV Area (PHT): 3.77  cm     TR Peak grad:   11.7 mmHg MV Area VTI:   2.13 cm     TR Vmax:        171.00 cm/s MV Peak grad:  13.2 mmHg MV Mean grad:  4.0 mmHg     SHUNTS MV Vmax:       1.82 m/s     Systemic VTI:  0.26 m MV Vmean:      99.0 cm/s    Systemic Diam: 2.20 cm MV Decel Time: 201 msec MV E velocity: 175.00 cm/s MV A velocity: 116.00 cm/s MV E/A ratio:  1.51 Oswaldo Milian MD Electronically signed by Oswaldo Milian MD  Signature Date/Time: 05/06/2022/3:28:52 PM    Final     PHYSICAL EXAM (off sedation) General: Intubated elderly patient in no acute distress Respiratory: Respirations synchronous with ventilator Neurological: Pupils equal round and reactive, cough and gag present, right gaze deviation noted, patient unable to follow commands but will move all extremities to sternal rub, right greater than left.  Does not respond to noxious stimuli on the right.  ASSESSMENT/PLAN Ms. Liddie Chichester is a 88 y.o. female with history of diabetes, emphysema and hypertension presenting with inability to move her right side.  TNK was not given as she presented outside the window.  She was taken to interventional radiology for mechanical thrombectomy of occluded left MCA, and TICI 3 flow was achieved after 1 pass.  MRI shows left MCA territory infarct with some hemorrhagic transformation.  Will consult palliative care for assistance in determining goals of care with family.  Stroke:  left MCA stroke Etiology: Possibly embolic versus large vessel stenosis Code Stroke CT head acute infarct in the left MCA territory ASPECTS 5-6 CTA head & neck acute occlusion of left M1 segment with poor opacifications of M2 segments, severe stenosis of vertebrobasilar junction and proximal basilar artery, multifocal mild to moderate stenoses in bilateral PCA territory, severe stenosis at left V3 V4 junction Post IR CT no evidence of hemorrhage MRI left MCA territory infarct with some hemorrhagic transformation 2D Echo EF 70 to  75%, moderate asymmetric LVH with basal septal segment, normal left atrial size, interatrial septum not well-visualized LDL 112 HgbA1c 9.6 VTE prophylaxis -SCDs    Diet   Diet NPO time specified   No antithrombotic prior to admission, now on No antithrombotic secondary to hemorrhagic transformation. Therapy recommendations: Pending Disposition: Pending, goals of care conversations with family ongoing  Hypertension Home meds: Lisinopril 20 mg daily Stable Keep systolic blood pressure 737-106 for 24 hours then liberalized to less than 180 Long-term BP goal normotensive  Hyperlipidemia Home meds: None LDL 112, goal < 70 Add rosuvastatin 20 mg daily Continue statin at discharge  Diabetes type II Uncontrolled Home meds: Glipizide 10 mg twice daily HgbA1c 9.6, goal < 7.0 CBGs Recent Labs    05/07/22 0319 05/07/22 0805 05/07/22 1123  GLUCAP 236* 312* 261*   Recommend close PCP follow-up for better diabetes control SSI  Respiratory failure Patient left intubated after procedure Ventilator management per CCM Extubate when able  Fever Patient febrile with Tmax of 101.4-> 102.2 Unasyn 3 g every 6 hours for aspiration pneumonia UA negative No leukocytosis  Other Stroke Risk Factors Advanced Age >/= 45  Obesity, Body mass index is 31.45 kg/m., BMI >/= 30 associated with increased stroke risk, recommend weight loss, diet and exercise as appropriate   Other Active Problems None  Hospital day # [place] , MSN, AGACNP-BC [facility] See Amion for schedule and pager information 05/07/2022 1:03 PM   ATTENDING ATTESTATION:  Left M1 s/p IR with large left MCA stroke with HT.  Poor exam with left gaze preference, dense left hemipelgia, not waking up, not following commands. Discussed prognosis with multiple family members. Pallative consulted, planned meeting tomorrow am. Holding antiplatelets due to HT. TF on going.  Dr. Reeves Forth evaluated pt  independently, reviewed imaging, chart, labs. Discussed and formulated plan with the Resident/APP. Changes were made to the note where appropriate. Please see APP/resident note above for details.     This patient is critically ill due to respiratory distress, stroke s/p IR and at  significant risk of neurological worsening, death form heart failure, respiratory failure, recurrent stroke, bleeding from [facility], seizure, sepsis. This patient's care requires constant monitoring of vital signs, hemodynamics, respiratory and cardiac monitoring, review of multiple databases, neurological assessment, discussion with family, other specialists and medical decision making of high complexity. I spent 35 minutes of neurocritical care time in the care of this patient.   Detric Scalisi,MD    To contact Stroke Continuity provider, please refer to [email or web address] After hours, contact General Neurology

## 2022-05-07 NOTE — Consult Note (Signed)
Palliative Medicine Inpatient Consult Note  Consulting Provider:  de Ashley Murrain, NP   Reason for consult:   [facility] Consult  Reason for Consult? patient with large left MCA stroke, unable to extubate so far, family needs assistance in determining goals of care   05/07/2022  HPI:  Per intake H&P -->  [name] is a 88 y.o. female  with a h/o dementia, DM, emphysema, HTN, thyroid dz, and prior UTI(s). Admitted d/t inability to move R side. MRI showed that she had suffered a large left MCA territory stroke s/p mechanical thrombectomy of occluded left MCA, and TICI 3 flow was achieved after 1 pass. Now with hemorrhagic transformation.   Palliative care has been asked to get involved for discussions as related to goals of care moving forward.   Clinical Assessment/Goals of Care:  *Please note that this is a verbal dictation therefore any spelling or grammatical errors are due to the "[employer]" system interpretation.  I have reviewed medical records including EPIC notes, labs and imaging, received report from bedside RN, assessed the patient who is lying in bed intubated though not noted to be on any sedation.    I called patients daughter, [name] to further discuss diagnosis prognosis, GOC, EOL wishes, disposition and options.   I introduced Palliative Medicine as specialized medical care for people living with serious illness. It focuses on providing relief from the symptoms and stress of a serious illness. The goal is to improve quality of life for both the patient and the family.  Medical History Review and Understanding:  We reviewed patients past medical history of type 2 diabetes, cognitive impairment, HTN, CAD, and emphysema.   Social History:  [name] is from [place] originally. She is a widow and has five children, 32 grandchildren, and > 30 great grandchildren. 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 She gets joy out of spending time with her family. She likes to listen to music. She has a strong International aid/development worker and practices within the [facility] denomination.   Functional and Nutritional State:  Living in an extended stay motel with daughter and son. Had been mobile and able to walk with a walker, dress herself, & bath herself though did need some assistance. She had a good appetite preceding admission.   Advance Directives:  A detailed discussion was had today regarding advanced directives.  Patient has not advance directives therefore decisions are deferred to her five children.   Code Status:  Concepts specific to code status, artifical feeding and hydration, continued IV antibiotics and rehospitalization was had.  The difference between a aggressive medical intervention path  and a palliative comfort care path for this patient at this time was had.   At this time patient remains a full code. Her daughter [name] shares that while watching a medical drama once [name] vocalized the medical team "giving up too soon". This to [name] indicated that [facility] would want to have all interventions for improvement.   We reviewed the importance of reviewing what full cardiopulmonary resuscitation entails with all of [name]'s five children as often the interventions pursued can be very traumatic to the body.   "Hard Choices for Loving People" booklet was left at bedside.   Discussion:  I reviewed with patients daughter, [name] the progress notes. I shared that often strokes in older patients who had some functional deficits at baseline can be greatly devastating.  I shared that based upon imaging studies [name] will likely have long term implications and declined function in most to all facets of her life.   Patients daughter, [name] shares that the medical providers have told her that it;'s time to "let [name] go". She is having a hard time conceptualizing this as she feels  [name] is responsive to her through the use of her arm, moving her feet, and withdrawing from pain.  WE agreed to have a formal meeting with all of Loyes five children tomorrow between the hours of 9=10 for further conversations.   Discussed the importance of continued conversation with family and their  medical providers regarding overall plan of care and treatment options, ensuring decisions are within the context of the patients values and GOCs.  Decision Maker: [name],[name] (Daughter): [phone] (Home Phone)   SUMMARY OF RECOMMENDATIONS   Full Code/Full Scope of Care --> Pending further conversations  Plan for family meeting tomorrow between 9AM-10AM --> Will asked RN to inform PMT and Neurology when all family arrive  Ongoing PMT support  Code Status/Advance Care Planning: FULL CODE  Palliative Prophylaxis:  Aspiration, Bowel Regimen, Delirium Protocol, Frequent Pain Assessment, Oral Care, Palliative Wound Care, and Turn Reposition  Additional Recommendations (Limitations, Scope, Preferences): Continue current care at this time  Psycho-social/Spiritual:  Desire for further Chaplaincy support: Yes Additional Recommendations: Discussion of long term stroke deficits   Prognosis: Overall quite poor given the amount of debility patient will likely endure post stroke in addition to her chronic disease burden at baseline.   Discharge Planning: Unclear at this time.   Vitals:   05/07/22 1200 05/07/22 1300  BP: (!) 134/59 (!) 140/47  Pulse: 79 73  Resp:    Temp: 99.7 F (37.6 C)   SpO2: 100% 100%    Intake/Output Summary (Last 24 hours) at 05/07/2022 1435 Last data filed at 05/07/2022 1333 Gross per 24 hour  Intake 1279.02 ml  Output 2075 ml  Net -795.98 ml   Last Weight  Most recent update: 05/07/2022  5:16 AM    Weight  78 kg (171 lb 15.3 oz)            Gen:  Elderly caucasian F acutely ill in appearence HEENT: ETT, cortrak, dry mucous membranes CV: Regular rate  and rhythm  PULM: On mechanical ventilator ABD: soft/nontender, (+) BS EXT: No edema  Neuro: Somnolent  PPS: 10%   This conversation/these recommendations were discussed with patient primary care team, Dr. Reeves Forth  Billing based on MDM: High  Problems Addressed: One acute or chronic illness or injury that poses a threat to life or bodily function  Amount and/or Complexity of Data: Category 3:Discussion of management or test interpretation with external physician/other qualified health care professional/appropriate source (not separately reported)  Risks: Decision regarding hospitalization or escalation of hospital care and Decision not to resuscitate or to de-escalate care because of poor prognosis ______________________________________________________ [place] Team Team Cell Phone: [phone] Please utilize secure chat with additional questions, if there is no response within 30 minutes please call the above phone number  Palliative Medicine Team providers are available by phone from 7am to 7pm daily and can be reached through the team cell phone.  Should this patient require assistance outside of these hours, please call the patient's attending physician.

## 2022-05-07 NOTE — Progress Notes (Signed)
PT Cancellation Note  Patient Details Name: [name] MRN: [id number] DOB: [birth year]   Cancelled Treatment:    Reason Eval/Treat Not Completed: Patient not medically ready;Other (comment) (potentially going comfort). Will check back tomorrow but will sign off if pt becomes comfort care only.   Leighton Roach, PT  Acute Rehab Services Secure chat preferred Office [place] 05/07/2022, 12:52 PM

## 2022-05-08 ENCOUNTER — Encounter (HOSPITAL_COMMUNITY): Payer: Medicare HMO

## 2022-05-08 DIAGNOSIS — Z515 Encounter for palliative care: Secondary | ICD-10-CM | POA: Diagnosis not present

## 2022-05-08 DIAGNOSIS — J9601 Acute respiratory failure with hypoxia: Secondary | ICD-10-CM | POA: Diagnosis not present

## 2022-05-08 DIAGNOSIS — I639 Cerebral infarction, unspecified: Secondary | ICD-10-CM | POA: Diagnosis not present

## 2022-05-08 DIAGNOSIS — Z7189 Other specified counseling: Secondary | ICD-10-CM | POA: Diagnosis not present

## 2022-05-08 LAB — CULTURE, RESPIRATORY W GRAM STAIN: Culture: NORMAL

## 2022-05-08 LAB — CBC
HCT: 36.2 % (ref 36.0–46.0)
Hemoglobin: 12 g/dL (ref 12.0–15.0)
MCH: 30.6 pg (ref 26.0–34.0)
MCHC: 33.1 g/dL (ref 30.0–36.0)
MCV: 92.3 fL (ref 80.0–100.0)
Platelets: 153 10*3/uL (ref 150–400)
RBC: 3.92 MIL/uL (ref 3.87–5.11)
RDW: 12.3 % (ref 11.5–15.5)
WBC: 8.1 10*3/uL (ref 4.0–10.5)
nRBC: 0 % (ref 0.0–0.2)

## 2022-05-08 LAB — BASIC METABOLIC PANEL
Anion gap: 12 (ref 5–15)
BUN: 14 mg/dL (ref 8–23)
CO2: 24 mmol/L (ref 22–32)
Calcium: 8 mg/dL — ABNORMAL LOW (ref 8.9–10.3)
Chloride: 103 mmol/L (ref 98–111)
Creatinine, Ser: 0.64 mg/dL (ref 0.44–1.00)
GFR, Estimated: >60 mL/min (ref >60)
Glucose, Bld: 233 mg/dL — ABNORMAL HIGH (ref 70–99)
Potassium: 3.6 mmol/L (ref 3.5–5.1)
Sodium: 139 mmol/L (ref 135–145)

## 2022-05-08 LAB — GLUCOSE, CAPILLARY
Glucose-Capillary: 258 mg/dL — ABNORMAL HIGH (ref 70–99)
Glucose-Capillary: 256 mg/dL — ABNORMAL HIGH (ref 70–99)
Glucose-Capillary: 241 mg/dL — ABNORMAL HIGH (ref 70–99)
Glucose-Capillary: 211 mg/dL — ABNORMAL HIGH (ref 70–99)
Glucose-Capillary: 235 mg/dL — ABNORMAL HIGH (ref 70–99)
Glucose-Capillary: 225 mg/dL — ABNORMAL HIGH (ref 70–99)

## 2022-05-08 LAB — MAGNESIUM: Magnesium: 2.2 mg/dL (ref 1.7–2.4)

## 2022-05-08 LAB — PHOSPHORUS: Phosphorus: 2.6 mg/dL (ref 2.5–4.6)

## 2022-05-08 MED ORDER — ENOXAPARIN SODIUM 40 MG/0.4ML IJ SOSY
40.0000 mg | PREFILLED_SYRINGE | INTRAMUSCULAR | Status: DC
  Administered 2022-05-08 – 2022-05-15 (×8): 40 mg via SUBCUTANEOUS
  Filled 2022-05-08 (×8): qty 0.4

## 2022-05-08 MED ORDER — SENNOSIDES-DOCUSATE SODIUM 8.6-50 MG PO TABS
1.0000 | ORAL_TABLET | Freq: Two times a day (BID) | ORAL | Status: DC
  Administered 2022-05-08 – 2022-05-15 (×11): 1
  Filled 2022-05-08 (×12): qty 1

## 2022-05-08 MED ORDER — INSULIN GLARGINE-YFGN 100 UNIT/ML ~~LOC~~ SOLN
15.0000 [IU] | Freq: Two times a day (BID) | SUBCUTANEOUS | Status: DC
  Administered 2022-05-08 (×2): 15 [IU] via SUBCUTANEOUS
  Filled 2022-05-08 (×4): qty 0.15

## 2022-05-08 MED ORDER — POTASSIUM CHLORIDE 20 MEQ PO PACK
40.0000 meq | PACK | Freq: Once | ORAL | Status: AC
  Administered 2022-05-08: 40 meq
  Filled 2022-05-08: qty 2

## 2022-05-08 MED ORDER — INSULIN ASPART 100 UNIT/ML IJ SOLN
4.0000 [IU] | INTRAMUSCULAR | Status: DC
  Administered 2022-05-08 – 2022-05-16 (×48): 4 [IU] via SUBCUTANEOUS

## 2022-05-08 MED ORDER — CARVEDILOL 12.5 MG PO TABS
12.5000 mg | ORAL_TABLET | Freq: Two times a day (BID) | ORAL | Status: DC
  Administered 2022-05-08 – 2022-05-16 (×16): 12.5 mg
  Filled 2022-05-08 (×16): qty 1

## 2022-05-08 MED ORDER — POTASSIUM & SODIUM PHOSPHATES 280-160-250 MG PO PACK
2.0000 | PACK | Freq: Once | ORAL | Status: AC
  Administered 2022-05-08: 2
  Filled 2022-05-08: qty 2

## 2022-05-08 NOTE — Progress Notes (Signed)
ANTICOAGULATION CONSULT NOTE - Initial Consult  Pharmacy Consult for Lovenox Indication: VTE prophylaxis  Allergies  Allergen Reactions   Aspirin Other (See Comments)    Stomach cramp    Patient Measurements: Height: 5' 2"$  (157.5 cm) Weight: 78.7 kg (173 lb 8 oz) IBW/kg (Calculated) : 50.1  Vital Signs: Temp: 99.6 F (37.6 C) (02/09 1200) Temp Source: Axillary (02/09 1200) BP: 166/70 (02/09 1400) Pulse Rate: 88 (02/09 1400)  Labs: Recent Labs    05/06/22 0505 05/07/22 0847 05/08/22 0632  HGB 12.6 11.7* 12.0  HCT 37.7 33.4* 36.2  PLT 203 157 153  CREATININE 0.66 0.64 0.64    Estimated Creatinine Clearance: 49 mL/min (by C-G formula based on SCr of 0.64 mg/dL).   Medical History: Past Medical History:  Diagnosis Date   Diabetes mellitus without complication ([place])    Emphysema of lung ([place])    Hypertension    Thyroid disease    UTI (lower urinary tract infection)     Medications:  Medications Prior to Admission  Medication Sig Dispense Refill Last Dose   glipiZIDE (GLUCOTROL) 10 MG tablet Take 10 mg by mouth 2 (two) times daily before a meal.   05/05/2022   lisinopril (ZESTRIL) 20 MG tablet Take 20 mg by mouth daily.   05/05/2022   ACCU-CHEK AVIVA PLUS test strip USE AS DIRECTED TO TEST BLOOD SUGAR ONCE DAILY      blood glucose meter kit and supplies Dispense based on patient and insurance preference. Use up to four times daily as directed. (FOR ICD-10 E10.9, E11.9). 1 each 0     Assessment: 88 y.o. M presents with code stroke and now with hemorrhagic transformation. Pharmacy asked to start Lovenox for VTE prophylaxis. CBC ok on admission. No AC PTA.  Goal of Therapy:  Prevention of VTE Monitor platelets by anticoagulation protocol: Yes   Plan:  Lovenox 72m SQ q24h Pharmacy will sign off - please reconsult if needed  CSherlon Handing PharmD, BCPS Please see amion for complete clinical pharmacist phone list 05/08/2022,3:57 PM

## 2022-05-08 NOTE — Progress Notes (Signed)
PT Cancellation Note  Patient Details Name: [name] MRN: [id number] DOB: 1934-09-17   Cancelled Treatment:    Reason Eval/Treat Not Completed: Medical issues which prohibited therapy. Pt remains intubated after large CVA with hemorrhagic conversion. PT will sign off at this time. If pt becomes better able to participate in PT evaluation please re-consult.   Zenaida Niece 05/08/2022, 12:57 PM

## 2022-05-08 NOTE — Progress Notes (Signed)
SLP Cancellation Note  Patient Details Name: [name] MRN: [id number] DOB: November 27, 1934   Cancelled treatment:       Reason Eval/Treat Not Completed: Patient not medically ready (on vent). Will f/u as able.    Osie Bond., M.A. [place] Office [phone]  Secure chat preferred  05/08/2022, 7:40 AM

## 2022-05-08 NOTE — Progress Notes (Signed)
Occupational Therapy Discharge Patient Details Name: [name] MRN: [id number] DOB: October 24, 1934 Today's Date: 05/08/2022 Time:  -     Patient discharged from OT services secondary to medical decline - will need to re-order OT to resume therapy services and Palliative currently working with family without medical changes to allow therapy evaluation. OT to sign off at this time. Recommend reorder if becomes appropriate.  .  Please see latest therapy progress note for current level of functioning and progress toward goals.    Progress and discharge plan discussed with patient and/or caregiver: Patient unable to participate in discharge planning and no caregivers available  GO     Jeri Modena 05/08/2022, 12:34 PM

## 2022-05-08 NOTE — Progress Notes (Addendum)
STROKE TEAM PROGRESS NOTE   INTERVAL HISTORY Patient is seen in her room with no family at the bedside.  She remains in the ICU but has been hemodynamically stable.  MRI yesterday revealed large left MCA territory stroke with hemorrhagic transformation.   Family meeting today -> agreed to DNR status. Family wants some time to discuss goals of care and next steps.   Vitals:   05/08/22 0700 05/08/22 0745 05/08/22 0800 05/08/22 0819  BP: (!) 148/58  (!) 160/55 (!) 160/55  Pulse: 69  69 91  Resp:      Temp:      TempSrc:      SpO2: 100% 100% 100% 100%  Weight:      Height:       CBC:  Recent Labs  Lab 05/05/22 1000 05/05/22 1011 05/06/22 0505 05/07/22 0847 05/08/22 0632  WBC 5.5  --  9.3 7.7 8.1  NEUTROABS 3.5  --  6.7  --   --   HGB 13.9   < > 12.6 11.7* 12.0  HCT 39.7   < > 37.7 33.4* 36.2  MCV 90.6  --  91.1 89.3 92.3  PLT 221  --  203 157 153   < > = values in this interval not displayed.    Basic Metabolic Panel:  Recent Labs  Lab 05/07/22 0847 05/07/22 1810 05/08/22 0632  NA 135  --  139  K 3.5  --  3.6  CL 101  --  103  CO2 25  --  24  GLUCOSE 305*  --  233*  BUN 12  --  14  CREATININE 0.64  --  0.64  CALCIUM 8.0*  --  8.0*  MG  --  2.1 2.2  PHOS  --  2.0* 2.6    Lipid Panel:  Recent Labs  Lab 05/06/22 0505  CHOL 176  TRIG 172*  177*  HDL 30*  CHOLHDL 5.9  VLDL 34  LDLCALC 112*    HgbA1c:  Recent Labs  Lab 05/05/22 1000  HGBA1C 9.6*    Urine Drug Screen:  Recent Labs  Lab 05/05/22 1152  LABOPIA NONE DETECTED  COCAINSCRNUR NONE DETECTED  LABBENZ NONE DETECTED  AMPHETMU NONE DETECTED  THCU NONE DETECTED  LABBARB NONE DETECTED     Alcohol Level  Recent Labs  Lab 05/05/22 1000  ETH <10     IMAGING past 24 hours No results found.  PHYSICAL EXAM (off sedation) General: Intubated elderly patient in no acute distress Respiratory: Respirations synchronous with ventilator Neurological: Pupils equal round and reactive, cough  and gag present, right gaze deviation noted, patient unable to follow commands but will move all extremities to sternal rub, right greater than left.  Does not respond to noxious stimuli on the right.  ASSESSMENT/PLAN Ms. Geet Ehman is a 88 y.o. female with history of diabetes, emphysema and hypertension presenting with inability to move her right side.  TNK was not given as she presented outside the window.  She was taken to interventional radiology for mechanical thrombectomy of occluded left MCA, and TICI 3 flow was achieved after 1 pass.  MRI shows left MCA territory infarct with some hemorrhagic transformation.  Family meeting 2/9, now DNR. She has been off sedation since 2/7  Stroke:  left MCA stroke Etiology: Possibly embolic versus large vessel stenosis Code Stroke CT head acute infarct in the left MCA territory ASPECTS 5-6 CTA head & neck acute occlusion of left M1 segment with poor opacifications of M2  segments, severe stenosis of vertebrobasilar junction and proximal basilar artery, multifocal mild to moderate stenoses in bilateral PCA territory, severe stenosis at left V3 V4 junction Post IR CT no evidence of hemorrhage MRI left MCA territory infarct with some hemorrhagic transformation 2D Echo EF 70 to 75%, moderate asymmetric LVH with basal septal segment, normal left atrial size, interatrial septum not well-visualized LDL 112 HgbA1c 9.6 VTE prophylaxis -SCDs. Lovenox added today.    Diet   Diet NPO time specified   No antithrombotic prior to admission, now on No antithrombotic secondary to hemorrhagic transformation. Therapy recommendations: Pending Disposition: Pending, goals of care conversations with family ongoing  Hypertension Home meds: Lisinopril 20 mg daily Stable Keep systolic blood pressure 0000000 for 24 hours then liberalized to less than 180 Long-term BP goal normotensive  Hyperlipidemia Home meds: None LDL 112, goal < 70 rosuvastatin 20 mg  daily Continue statin at discharge  Diabetes type II Uncontrolled Home meds: Glipizide 10 mg twice daily HgbA1c 9.6, goal < 7.0 CBGs Recent Labs    05/07/22 2303 05/08/22 0301 05/08/22 0747  GLUCAP 291* 258* 256*   Recommend close PCP follow-up for better diabetes control SSI  Respiratory failure Patient left intubated after procedure Ventilator management per CCM Extubate when able  Fever Patient febrile with Tmax of 101.4-> 102.2 -> 98.6 Unasyn 3 g every 6 hours for aspiration pneumonia UA negative No leukocytosis  Other Stroke Risk Factors Advanced Age >/= 74  Obesity, Body mass index is 31.73 kg/m., BMI >/= 30 associated with increased stroke risk, recommend weight loss, diet and exercise as appropriate   Other Active Problems None  Hospital day # 3  Patient seen and examined by NP/APP with MD. MD to update note as needed.   Janine Ores, DNP, FNP-BC [facility] Pager: [phone]  ATTENDING ATTESTATION:  88 year old with large left MCA stroke status post thrombectomy for M1 occlusion with hemorrhagic transformation.  Currently she is breathing on her own but not following commands she will move her left arm and leg to painful stimuli.  Eyes she will keep forcibly closed, pupils equal round reactive, would not track examiner.  Large family meeting this morning with palliative care.  Family made her DNR should she have a acute decline.  She is currently breathing on her own will likely be able to do so for some time if she were to be extubated.  Family's questions were answered to their satisfaction. they needed more time to discuss comfort care measures.  Will continue ongoing care for now with vent, Tfs and BP mgt.  Continue antibiotics for aspiration pneumonia.  Discussed with Dr. Tacy Learn, appreciate CCM assistance.  Dr. Reeves Forth evaluated pt independently, reviewed imaging, chart, labs. Discussed and formulated plan with the Resident/APP. Changes  were made to the note where appropriate. Please see APP/resident note above for details.      This patient is critically ill due to respiratory distress, stroke s/p thrombbectomy and at significant risk of neurological worsening, death form heart failure, respiratory failure, recurrent stroke, bleeding from [facility], seizure, sepsis. This patient's care requires constant monitoring of vital signs, hemodynamics, respiratory and cardiac monitoring, review of multiple databases, neurological assessment, discussion with family, other specialists and medical decision making of high complexity. I spent 35 minutes of neurocritical care time in the care of this patient.   Kylinn Shropshire,MD   To contact Stroke Continuity provider, please refer to [email or web address] After hours, contact General Neurology

## 2022-05-08 NOTE — Progress Notes (Signed)
Palliative:  HPI: 88 y.o. female  with a h/o dementia, DM, emphysema, HTN, thyroid dz, and prior UTI(s). Admitted d/t inability to move R side. MRI showed that she had suffered a large left MCA territory stroke s/p mechanical thrombectomy of occluded left MCA, and TICI 3 flow was achieved after 1 pass. Now with hemorrhagic transformation. Palliative care has been asked to get involved for discussions as related to goals of care moving forward.   I met today with multiple grandchildren and 1 son was present via telephone. Family confirms that her other children were unable or chose not to attend. I offered that we can get them over the phone if desired. Grandchildren shared with me about [name] and how independent and functional she is and how she loves to walk. They tell me that she is happy to be in her chair and watching television most days. Her family is very important to her.   [name] joined [place] and explained the severity of her stroke and expectations moving forward. He was very clear about [name]'s deficits from large stroke and the quality of life she would be facing. We discussed expectations for full scope with measures to prolong life vs comfort care path. We discussed tube feeding and how this can cause more discomfort in these situations and would only prolong her suffering. I was clear that patients in her situation do not suffer from feelings of hunger and this would not be uncomfortable for her. We explained that we would have medication available to ensure her comfort and minimize suffering. Family was given opportunity to ask questions and discuss. They all agree that they do not want her to suffer. Son expresses that he would want to keep her here as long as possible but he acknowledges that he knows she would not want to live like this. We discussed DNR status and they agree with DNR at this time. Continue current treatment to allow further family discussion. Family today seem  clear that [name] would not desire her life to be prolonged in this state and I encouraged that we need to make our decisions on what she would want for herself.   All questions/concerns addressed. Emotional support provided. Updated Dr. Tacy Learn and RN.   Exam: Unresponsive. Does not follow commands. Weaning on vent. No distress. Moves towards noxious stimuli on left side but no clear purposeful movements.   Plan: - DNR decided - Anticipate moving towards comfort measures but family needs more time to discuss and process poor prognosis  82 min  Maria Sill, [name] Palliative Medicine Team Pager [phone] (Please see [email or web address] for schedule) Team Phone [phone]    Greater than 50%  of this time was spent counseling and coordinating care related to the above assessment and plan

## 2022-05-08 NOTE — Progress Notes (Signed)
NAME:  [name], MRN:  [id number], DOB:  1934-04-28, LOS: 3 ADMISSION DATE:  05/05/2022 CONSULTATION DATE:  05/05/2022 REFERRING MD:  Leonel Ramsay - Neuro CHIEF COMPLAINT:  Code Stroke, MV post-L MCA occlusion   History of Present Illness:  88 year old woman who presented to [facility] ED 2/6 as Code Stroke. Presented with R-sided weakness/neglect. LKW 0030 2/6. PMHx significant for HTN, emphysema, T2DM, thyroid disease.  Presented to ED as Code Stroke; noted to have R facial droop, R-sided weakness and aphasia. Following some commands with LUE on ED arrival. LKW ~0030 2/6. CT Head negative for ICH. CTA Head/Neck with left MCA M1 occlusion, poor flow in M2 segments, ?low flow, severe vertebrobasilar stenosis, moderate bilateral PCA stenoses. Out of window for TNK. Taken to [facility] for mechanical thrombectomy with TICI3 revascularization. Left intubated post-procedure.   PCCM consulted for medical management  Pertinent Medical History:   Past Medical History:  Diagnosis Date   Diabetes mellitus without complication ([place])    Emphysema of lung ([place])    Hypertension    Thyroid disease    UTI (lower urinary tract infection)    Significant Hospital Events: Including procedures, antibiotic start and stop dates in addition to other pertinent events   2/6 - Presented to ED as Code Stroke. CT Head negative for ICH. CTA Head/Neck with left MCA M1 occlusion, poor flow in M2 segments, ?low flow, severe vertebrobasilar stenosis, moderate bilateral PCA stenoses. Out of window for TNK. Taken to [facility] for mechanical thrombectomy with TICI3 revascularization. Left intubated post-procedure. PCCM consulted for vent management.  Interim History / Subjective:  Patient spiked fever again with Tmax 101 No overnight changes Tolerating spontaneous breathing trial  Objective:  Blood pressure (!) 160/55, pulse 91, temperature 98.6 F (37 C), temperature source Oral, resp. rate 18, height 5' 2"$  (1.575 m), weight 78.7 kg,  SpO2 100 %.    Vent Mode: CPAP FiO2 (%):  [30 %-40 %] 30 % Set Rate:  [18 bmp] 18 bmp Vt Set:  [400 mL] 400 mL PEEP:  [5 cmH20] 5 cmH20 Pressure Support:  [5 cmH20-10 cmH20] 5 cmH20 Plateau Pressure:  [14 cmH20] 14 cmH20   Intake/Output Summary (Last 24 hours) at 05/08/2022 0858 Last data filed at 05/08/2022 0800 Gross per 24 hour  Intake 1961.45 ml  Output 2250 ml  Net -288.55 ml   Filed Weights   05/05/22 0900 05/07/22 0500 05/08/22 0500  Weight: 83 kg 78 kg 78.7 kg   Physical Examination:   Physical exam: General: Crtitically ill-appearing elderly female, orally intubated HEENT: [place]/AT, eyes anicteric.  ETT and OGT in place Neuro: Eyes closed, does not open, not following commands, spontaneously moving left side, weaker on right side Chest: Fine crackles heard in right lower lobe, no wheezes or rhonchi Heart: Regular rate and rhythm, no murmurs or gallops Abdomen: Soft, nondistended, bowel sounds present Skin: No rash.  Chronic stasis changes in bilateral lower extremities with outgrown nails and toes bilaterally  Labs and images were reviewed   Resolved Hospital Problem List:    Assessment & Plan:  Acute left MCA stroke s/p mechanical thrombectomy with TICI 3 Continue neuro watch Patient remained off sedation but still not waking up Stroke team is following, goals of care discussions ongoing Palliative care, neurology and family is meeting with family today Continue secondary stroke prophylaxis MRI brain confirmed large left MCA stroke with hemorrhagic conversion Holding antiplatelets in the setting of hemorrhagic conversion  Acute hypoxic respiratory failure Aspiration pneumonia Patient is tolerating supportive  breathing trial, mental status precludes extubation VAP prevention bundle in place Off sedation Continue to spike fever, though fever spike is trending down after initiation of antibiotics Pending respiratory culture Continue Unasyn  Hypertension,  poorly controlled Moderate to severe aortic stenosis patient Chronic HFpEF Patient remained hypertensive Continue lisinopril 20 mg once daily Increase Coreg to 12.5 mg twice daily Echocardiogram is consistent with grade 2 diastolic dysfunction with ejection fraction 70-75%  Monitor intake and output  Poorly controlled diabetes with hyperglycemia Hemoglobin A1c 9.6% Patient blood sugars remain elevated Increase Lantus to 15 units twice daily Increase tube feed coverage 4 units every 4 hours Continue sliding scale insulin Monitor fingerstick with goal CBG 140-180  Obesity Counseling regarding diet and exercise postextubation  Best Practice: (right click and "Reselect all SmartList Selections" daily)   Diet/type: NPO start tube feeds DVT prophylaxis: SCDs GI prophylaxis: PPI Lines: Arterial Line Foley: Discontinue Code Status:  full code Last date of multidisciplinary goals of care discussion [Per primary team] ongoing goals of care discussion.  Family meeting is scheduled with palliative care today  Labs:  CBC: Recent Labs  Lab 05/05/22 1000 05/05/22 1011 05/05/22 1400 05/06/22 0505 05/07/22 0847 05/08/22 0632  WBC 5.5  --   --  9.3 7.7 8.1  NEUTROABS 3.5  --   --  6.7  --   --   HGB 13.9 13.3 12.9 12.6 11.7* 12.0  HCT 39.7 39.0 38.0 37.7 33.4* 36.2  MCV 90.6  --   --  91.1 89.3 92.3  PLT 221  --   --  203 157 0000000   Basic Metabolic Panel: Recent Labs  Lab 05/05/22 1011 05/05/22 1152 05/05/22 1400 05/06/22 0505 05/06/22 1832 05/07/22 0429 05/07/22 0847 05/07/22 1810 05/08/22 0632  NA 138 136 138 137  --   --  135  --  139  K 5.3* 3.8 3.8 3.6  --   --  3.5  --  3.6  CL 102 99  --  105  --   --  101  --  103  CO2  --  25  --  22  --   --  25  --  24  GLUCOSE 260* 284*  --  133*  --   --  305*  --  233*  BUN 13 9  --  7*  --   --  12  --  14  CREATININE 0.50 0.59  --  0.66  --   --  0.64  --  0.64  CALCIUM  --  8.6*  --  8.1*  --   --  8.0*  --  8.0*  MG   --   --   --   --  1.9 1.9  --  2.1 2.2  PHOS  --   --   --   --  2.6 2.6  --  2.0* 2.6   GFR: Estimated Creatinine Clearance: 49 mL/min (by C-G formula based on SCr of 0.64 mg/dL). Recent Labs  Lab 05/05/22 1000 05/06/22 0505 05/07/22 0847 05/08/22 0632  WBC 5.5 9.3 7.7 8.1   Liver Function Tests: Recent Labs  Lab 05/05/22 1152  AST 17  ALT 15  ALKPHOS 57  BILITOT 0.2*  PROT 6.8  ALBUMIN 3.4*   No results for input(s): "LIPASE", "AMYLASE" in the last 168 hours. No results for input(s): "AMMONIA" in the last 168 hours.  ABG:    Component Value Date/Time   PHART 7.345 (L) 05/05/2022 1400   PCO2ART  45.7 05/05/2022 1400   PO2ART 100 05/05/2022 1400   HCO3 25.1 05/05/2022 1400   TCO2 26 05/05/2022 1400   ACIDBASEDEF 1.0 05/05/2022 1400   O2SAT 97 05/05/2022 1400    Coagulation Profile: Recent Labs  Lab 05/05/22 1000  INR 1.1   Cardiac Enzymes: No results for input(s): "CKTOTAL", "CKMB", "CKMBINDEX", "TROPONINI" in the last 168 hours.  HbA1C: Hgb A1c MFr Bld  Date/Time Value Ref Range Status  05/05/2022 10:00 AM 9.6 (H) 4.8 - 5.6 % Final    Comment:    (NOTE) Pre diabetes:          5.7%-6.4%  Diabetes:              >6.4%  Glycemic control for   <7.0% adults with diabetes   12/29/2017 04:48 PM 9.1 (H) 4.6 - 6.5 % Final    Comment:    Glycemic Control Guidelines for People with Diabetes:Non Diabetic:  <6%Goal of Therapy: <7%Additional Action Suggested:  >8%    CBG: Recent Labs  Lab 05/07/22 1522 05/07/22 1916 05/07/22 2303 05/08/22 0301 05/08/22 0747  GLUCAP 241* 238* 291* 258* 256*    This patient is critically ill with multiple organ system failure which requires frequent high complexity decision making, assessment, support, evaluation, and titration of therapies. This was completed through the application of advanced monitoring technologies and extensive interpretation of multiple databases.  During this encounter critical care time was devoted  to patient care services described in this note for 36 minutes.    Jacky Kindle, MD [facility] See Amion for pager If no response to pager, please call [phone] until 7pm After 7pm, Please call E-link [phone]

## 2022-05-09 DIAGNOSIS — E11649 Type 2 diabetes mellitus with hypoglycemia without coma: Secondary | ICD-10-CM

## 2022-05-09 DIAGNOSIS — J690 Pneumonitis due to inhalation of food and vomit: Secondary | ICD-10-CM | POA: Diagnosis not present

## 2022-05-09 DIAGNOSIS — J9600 Acute respiratory failure, unspecified whether with hypoxia or hypercapnia: Secondary | ICD-10-CM

## 2022-05-09 DIAGNOSIS — J988 Other specified respiratory disorders: Secondary | ICD-10-CM

## 2022-05-09 DIAGNOSIS — I639 Cerebral infarction, unspecified: Secondary | ICD-10-CM | POA: Diagnosis not present

## 2022-05-09 DIAGNOSIS — Z7189 Other specified counseling: Secondary | ICD-10-CM | POA: Diagnosis not present

## 2022-05-09 DIAGNOSIS — Z515 Encounter for palliative care: Secondary | ICD-10-CM | POA: Diagnosis not present

## 2022-05-09 LAB — BASIC METABOLIC PANEL
Anion gap: 10 (ref 5–15)
BUN: 16 mg/dL (ref 8–23)
CO2: 26 mmol/L (ref 22–32)
Calcium: 8.1 mg/dL — ABNORMAL LOW (ref 8.9–10.3)
Chloride: 101 mmol/L (ref 98–111)
Creatinine, Ser: 0.53 mg/dL (ref 0.44–1.00)
GFR, Estimated: >60 mL/min (ref >60)
Glucose, Bld: 211 mg/dL — ABNORMAL HIGH (ref 70–99)
Potassium: 4 mmol/L (ref 3.5–5.1)
Sodium: 137 mmol/L (ref 135–145)

## 2022-05-09 LAB — CBC
HCT: 35 % — ABNORMAL LOW (ref 36.0–46.0)
Hemoglobin: 11.8 g/dL — ABNORMAL LOW (ref 12.0–15.0)
MCH: 30.9 pg (ref 26.0–34.0)
MCHC: 33.7 g/dL (ref 30.0–36.0)
MCV: 91.6 fL (ref 80.0–100.0)
Platelets: 161 K/uL (ref 150–400)
RBC: 3.82 MIL/uL — ABNORMAL LOW (ref 3.87–5.11)
RDW: 12.3 % (ref 11.5–15.5)
WBC: 7.1 K/uL (ref 4.0–10.5)
nRBC: 0 % (ref 0.0–0.2)

## 2022-05-09 LAB — GLUCOSE, CAPILLARY
Glucose-Capillary: 228 mg/dL — ABNORMAL HIGH (ref 70–99)
Glucose-Capillary: 222 mg/dL — ABNORMAL HIGH (ref 70–99)
Glucose-Capillary: 247 mg/dL — ABNORMAL HIGH (ref 70–99)
Glucose-Capillary: 228 mg/dL — ABNORMAL HIGH (ref 70–99)
Glucose-Capillary: 244 mg/dL — ABNORMAL HIGH (ref 70–99)
Glucose-Capillary: 184 mg/dL — ABNORMAL HIGH (ref 70–99)

## 2022-05-09 MED ORDER — INSULIN GLARGINE-YFGN 100 UNIT/ML ~~LOC~~ SOLN
18.0000 [IU] | Freq: Two times a day (BID) | SUBCUTANEOUS | Status: DC
  Administered 2022-05-09 – 2022-05-16 (×15): 18 [IU] via SUBCUTANEOUS
  Filled 2022-05-09 (×16): qty 0.18

## 2022-05-09 MED ORDER — ASPIRIN 325 MG PO TABS
325.0000 mg | ORAL_TABLET | Freq: Every day | ORAL | Status: DC
  Administered 2022-05-09 – 2022-05-16 (×8): 325 mg
  Filled 2022-05-09 (×8): qty 1

## 2022-05-09 NOTE — Progress Notes (Signed)
NAME:  [name], MRN:  [id number], DOB:  Jul 27, 1934, LOS: 4 ADMISSION DATE:  05/05/2022 CONSULTATION DATE:  05/05/2022 REFERRING MD:  Leonel Ramsay - Neuro CHIEF COMPLAINT:  Code Stroke, MV post-L MCA occlusion   History of Present Illness:  88 year old woman who presented to [facility] ED 2/6 as Code Stroke. Presented with R-sided weakness/neglect. LKW 0030 2/6. PMHx significant for HTN, emphysema, T2DM, thyroid disease.  Presented to ED as Code Stroke; noted to have R facial droop, R-sided weakness and aphasia. Following some commands with LUE on ED arrival. LKW ~0030 2/6. CT Head negative for ICH. CTA Head/Neck with left MCA M1 occlusion, poor flow in M2 segments, ?low flow, severe vertebrobasilar stenosis, moderate bilateral PCA stenoses. Out of window for TNK. Taken to [facility] for mechanical thrombectomy with TICI3 revascularization. Left intubated post-procedure.   PCCM consulted for medical management  Pertinent Medical History:  DM type 2, Emphysema, HTN, Hypothyroidism  Significant Hospital Events: Including procedures, antibiotic start and stop dates in addition to other pertinent events   2/6 - Presented to ED as Code Stroke. CT Head negative for ICH. CTA Head/Neck with left MCA M1 occlusion, poor flow in M2 segments, ?low flow, severe vertebrobasilar stenosis, moderate bilateral PCA stenoses. Out of window for TNK. Taken to [facility] for mechanical thrombectomy with TICI3 revascularization. Left intubated post-procedure. PCCM consulted for vent management. 2/7- sedation turned off 2/8 - palliative care consulted 2/9 - change to DNR  Interim History / Subjective:  Tm 100.69F.  Tolerating pressure support.  Objective:  Blood pressure (!) 145/41, pulse 60, temperature 98.6 F (37 C), temperature source Oral, resp. rate 18, height 5' 2"$  (1.575 m), weight 78.7 kg, SpO2 100 %.    Vent Mode: PRVC FiO2 (%):  [30 %-40 %] 40 % Set Rate:  [18 bmp] 18 bmp Vt Set:  [400 mL] 400 mL PEEP:  [5 cmH20] 5  cmH20 Pressure Support:  [5 cmH20] 5 cmH20 Plateau Pressure:  [14 cmH20] 14 cmH20   Intake/Output Summary (Last 24 hours) at 05/09/2022 0740 Last data filed at 05/09/2022 0600 Gross per 24 hour  Intake 1899.91 ml  Output 1200 ml  Net 699.91 ml   Filed Weights   05/07/22 0500 05/08/22 0500 05/09/22 0430  Weight: 78 kg 78.7 kg 78.7 kg   Physical Examination:  General - on vent Eyes - squints when stimulated ENT - ETT in place Cardiac - regular rate/rhythm, 2/6 SM Chest - equal breath sounds b/l, no wheezing or rales Abdomen - soft, non tender, + bowel sounds Extremities - no cyanosis, clubbing, or edema Skin - no rashes Neuro - not following commands  Resolved Hospital Problem List:    Assessment & Plan:   Compromised airway in setting of CVA. - pressure support wean as tolerated - mental status precludes extubation trial - need to clarify goals of care with family before proceeding with extubation - goal SpO2 > 92% - f/u CXR intermittently  Aspiration pneumonitis. - day 3 of unasyn  Acute left MCA stroke s/p mechanical thrombectomy with TICI 3 - per neurology - prognosis for meaningful recovery seems poor  HTN, chronic diastolic CHF, mod AS, HLD. - goal SBP < 180 for now - continue coreg, lisinopril, crestor  DM type 2 poorly controlled with hyperglycemia. - SSI - increase semglee to 18 units bid  Best Practice: (right click and "Reselect all SmartList Selections" daily)   Diet/type: tube feeds DVT prophylaxis: lovenox GI prophylaxis: protonix Lines: none Foley: none Code Status:  DNR  Last date of multidisciplinary goals of care discussion [Palliative care consulted]  Labs:      Latest Ref Rng & Units 05/09/2022    2:48 AM 05/08/2022    6:32 AM 05/07/2022    8:47 AM  CMP  Glucose 70 - 99 mg/dL 211  233  305   BUN 8 - 23 mg/dL 16  14  12   $ Creatinine 0.44 - 1.00 mg/dL 0.53  0.64  0.64   Sodium 135 - 145 mmol/L 137  139  135   Potassium 3.5 - 5.1  mmol/L 4.0  3.6  3.5   Chloride 98 - 111 mmol/L 101  103  101   CO2 22 - 32 mmol/L 26  24  25   $ Calcium 8.9 - 10.3 mg/dL 8.1  8.0  8.0        Latest Ref Rng & Units 05/09/2022    2:48 AM 05/08/2022    6:32 AM 05/07/2022    8:47 AM  CBC  WBC 4.0 - 10.5 K/uL 7.1  8.1  7.7   Hemoglobin 12.0 - 15.0 g/dL 11.8  12.0  11.7   Hematocrit 36.0 - 46.0 % 35.0  36.2  33.4   Platelets 150 - 400 K/uL 161  153  157     ABG    Component Value Date/Time   PHART 7.345 (L) 05/05/2022 1400   PCO2ART 45.7 05/05/2022 1400   PO2ART 100 05/05/2022 1400   HCO3 25.1 05/05/2022 1400   TCO2 26 05/05/2022 1400   ACIDBASEDEF 1.0 05/05/2022 1400   O2SAT 97 05/05/2022 1400    CBG (last 3)  Recent Labs    05/08/22 2312 05/09/22 0333 05/09/22 0726  GLUCAP 225* 228* 222*    Critical care time: 34 minutes  Chesley Mires, MD [place] Pager - [phone] or [phone] 05/09/2022, 7:47 AM

## 2022-05-09 NOTE — Progress Notes (Signed)
SLP Cancellation Note  Patient Details Name: [name] MRN: [id number] DOB: 1935/01/28   Cancelled treatment:       Reason Eval/Treat Not Completed: Patient not medically ready. Patient remains on vent and family deciding on [place]. SLP to s/o at this time. Please reorder if needed.   Sonia Baller, MA, CCC-SLP Speech Therapy

## 2022-05-09 NOTE — Progress Notes (Signed)
STROKE TEAM PROGRESS NOTE   INTERVAL HISTORY Daughters and son are at the bedside along with palliative care NP Sharyn Lull. Pt lying in bed, briefly open eyes on voice but not following commands. Still intubated. Per daughter Crystal on the phone, pt would want full code, but they would like more time to decide on timing of extubation and what plan if not tolerating extubation.   Vitals:   05/09/22 1400 05/09/22 1500 05/09/22 1528 05/09/22 1600  BP: (!) 131/47 (!) 170/65 (!) 173/66 (!) 116/41  Pulse: (!) 58 73 60 (!) 55  Resp:   18   Temp:      TempSrc:      SpO2: 100% 100% 100% 100%  Weight:      Height:       CBC:  Recent Labs  Lab 05/05/22 1000 05/05/22 1011 05/06/22 0505 05/07/22 0847 05/08/22 0632 05/09/22 0248  WBC 5.5  --  9.3   < > 8.1 7.1  NEUTROABS 3.5  --  6.7  --   --   --   HGB 13.9   < > 12.6   < > 12.0 11.8*  HCT 39.7   < > 37.7   < > 36.2 35.0*  MCV 90.6  --  91.1   < > 92.3 91.6  PLT 221  --  203   < > 153 161   < > = values in this interval not displayed.   Basic Metabolic Panel:  Recent Labs  Lab 05/07/22 1810 05/08/22 0632 05/09/22 0248  NA  --  139 137  K  --  3.6 4.0  CL  --  103 101  CO2  --  24 26  GLUCOSE  --  233* 211*  BUN  --  14 16  CREATININE  --  0.64 0.53  CALCIUM  --  8.0* 8.1*  MG 2.1 2.2  --   PHOS 2.0* 2.6  --    Lipid Panel:  Recent Labs  Lab 05/06/22 0505  CHOL 176  TRIG 172*  177*  HDL 30*  CHOLHDL 5.9  VLDL 34  LDLCALC 112*   HgbA1c:  Recent Labs  Lab 05/05/22 1000  HGBA1C 9.6*   Urine Drug Screen:  Recent Labs  Lab 05/05/22 1152  LABOPIA NONE DETECTED  COCAINSCRNUR NONE DETECTED  LABBENZ NONE DETECTED  AMPHETMU NONE DETECTED  THCU NONE DETECTED  LABBARB NONE DETECTED    Alcohol Level  Recent Labs  Lab 05/05/22 1000  ETH <10    IMAGING past 24 hours No results found.  PHYSICAL EXAM   Temp:  [98.6 F (37 C)-100.4 F (38 C)] 99.4 F (37.4 C) (02/10 1200) Pulse Rate:  [54-86] 55 (02/10  1600) Resp:  [17-22] 18 (02/10 1528) BP: (104-197)/(34-151) 116/41 (02/10 1600) SpO2:  [97 %-100 %] 100 % (02/10 1600) FiO2 (%):  [30 %-40 %] 40 % (02/10 1528) Weight:  [78.7 kg] 78.7 kg (02/10 0430)  General - Well nourished, well developed, intubated off sedation.  Ophthalmologic - fundi not visualized due to noncooperation.  Cardiovascular - Regular rate and rhythm.  Neuro - intubated off sedation, eyes closed but briefly open with repetitive stimulation, not following commands. With forced eye opening, eyes in left gaze position, not blinking to visual threat, doll's eyes not cross midline, not tracking, PERRL. Corneal reflex present, gag and cough present. Breathing over the vent.  Facial symmetry not able to test due to ET tube.  Tongue protrusion not cooperative. On pain stimulation, withdraw  BUEs L>R and BLEs equally. Sensation, coordination and gait not tested.    ASSESSMENT/PLAN Ms. Diamani Berges is a 88 y.o. female with history of diabetes, emphysema and hypertension presenting with inability to move her right side.  TNK was not given as she presented outside the window.  She was taken to interventional radiology for mechanical thrombectomy of occluded left MCA, and TICI 3 flow was achieved after 1 pass.  MRI shows left MCA territory infarct with some hemorrhagic transformation.  Family meeting 2/9, now DNR. She has been off sedation since 2/7  Stroke:  large left MCA stroke with mild HT due to left M1 occlusion s/p IR with TICI3, etiology possibly large vessel stenosis, however, cardioembolic source can not be excluded. CT head acute infarct in the left MCA territory ASPECTS 5-6 CTA head & neck acute occlusion of left M1 segment with poor opacifications of M2 segments, severe stenosis of vertebrobasilar junction and proximal basilar artery, multifocal mild to moderate stenoses in bilateral PCA territory, severe stenosis at left V3 V4 junction MRI left MCA territory infarct with  some hemorrhagic transformation CT repeat pending in am 2D Echo EF 70 to 75%  LDL 112 HgbA1c 9.6 VTE prophylaxis - Lovenox   No antithrombotic prior to admission, now on ASA 325 Therapy recommendations: Pending Disposition: Pending, goals of care conversations with family ongoing. Per family, pt would like full code. Family requested more time to assess her condition before committed to extubation and plan if not tolerating extubation.   Respiratory failure ? Aspiration penumonia Patient left intubated after procedure Ventilator management per CCM On weaning now Extubate when able Tmax of 101.4-> 102.2 -> 100.4 On Unasyn  UA negative No leukocytosis  Hypertension Home meds: Lisinopril 20 mg daily Stable On coreg, lisnopril 20 Long-term BP goal normotensive  Hyperlipidemia Home meds: None LDL 112, goal < 70 rosuvastatin 20 mg daily Continue statin at discharge  Diabetes type II Uncontrolled Home meds: Glipizide 10 mg twice daily HgbA1c 9.6, goal < 7.0 CBGs SSI Recommend close PCP follow-up for better diabetes control  Dysphagia  Intubated now On TF @ 40  Other Stroke Risk Factors Advanced Age >/= 19  Obesity, Body mass index is 31.73 kg/m., BMI >/= 30 associated with increased stroke risk, recommend weight loss, diet and exercise as appropriate   Other Active Problems Emphysema  Hospital day # 4  This patient is critically ill due to large left MCA stroke with hemorrhagic transformation, respiratory failure with intubation, fever, hyperglycemia and at significant risk of neurological worsening, death form recurrent stroke, hemorrhagic transformation, cerebral edema, renal failure, sepsis, DKA. This patient's care requires constant monitoring of vital signs, hemodynamics, respiratory and cardiac monitoring, review of multiple databases, neurological assessment, discussion with family, other specialists and medical decision making of high complexity. I spent 50  minutes of neurocritical care time in the care of this patient. I had long discussion with daughters and son at bedside as well as daughter on the phone, updated pt current condition, treatment plan and potential prognosis, and answered all the questions.  They expressed understanding and appreciation.  I also discussed with palliative care NP.  Rosalin Hawking, MD PhD Stroke Neurology 05/09/2022 4:47 PM    To contact Stroke Continuity provider, please refer to [email or web address] After hours, contact General Neurology

## 2022-05-09 NOTE — Progress Notes (Signed)
Palliative Medicine Inpatient Follow Up Note  HPI: [name] is a 88 y.o. female  with a h/o dementia, DM, emphysema, HTN, thyroid dz, and prior UTI(s). Admitted d/t inability to move R side. MRI showed that she had suffered a large left MCA territory stroke s/p mechanical thrombectomy of occluded left MCA, and TICI 3 flow was achieved after 1 pass. Now with hemorrhagic transformation.    Palliative care has been asked to get involved for discussions as related to goals of care moving forward.   Today's Discussion 05/09/2022  *Please note that this is a verbal dictation therefore any spelling or grammatical errors are due to the "[place]" system interpretation.  Chart reviewed inclusive of vital signs, progress notes, laboratory results, and diagnostic images.   I was asked to come to bedside this late morning as family was present at bedside insisting that they had not agreed to DNR.   I met at bedside with patients RN, Beverlee Nims, Dr. Erlinda Hong, and patients two of patients five children - Freda and Tim.   Patients oldest daughter, Joanne Chars shares that she was under the impression that no formal decision was made towards DNR. I was able to open the notes from the day prior and explain that it did seem, in Tim's presence that a DNR was elected.   There is apparently a son who is estranged that the family worries made this decision without anyone's input.   I asked that Octavia Bruckner and Joanne Chars get their sisters, Wendelyn Breslow and Crystal on the phone so we could discuss their wishes at that time. Created space and opportunity for patients family to explore their thoughts feelings and fears regarding current medical situation.  Patients family vocalize that four days it not enough time to "know" what patient will be in the near future. Dr. Erlinda Hong and I shared that this is true though patient will likely be paralyzed on her R side long term and require artificial nutrition as well as long term placement. We  emphasized the importance of considering what the patient would want/desire if she were able to speak for herself.   Encouraged patients family to consider DNAR status understanding evidenced based poor outcomes in similar hospitalized patient, as the cause of arrest is likely associated with advanced chronic/terminal illness rather than an easily reversible acute cardio-pulmonary event. I explained that DNAR does not change the medical plan and it only comes into effect after a person has arrested (died).  It is a protective measure to keep [place] from harming the patient in their last moments of life.   We further discussed that  it is important the medical team understands what direction to go once patient is extubated. I shared that reintubation is a painful intervention. We reviewed the idea of a tracheostomy if patient is re-intubated and unable to graduate from ventilatory support or suffers from extreme secretion burden.   Patients oldest daughter, Joanne Chars shares that her husband died a week ago and it is overwhelming to consider letting her mother go too. She also shares that Bethany is financially dependent on [place] and would ideally want her to live forever.   I circled back during conversation to the reality that what we do should center around what the patient would and would not want. I shared if she would not desire a life long term in a nursing home being cared for by other people than perhaps an aggressive path is not the direction we should go.  At this time patients family have elected to revert the patient back to full code and allow her "a few more days" to see how she may fair. They continue to consider if when extubated they would desire for her to be re-intubated.   Questions and concerns addressed/Palliative Support Provided.   Objective Assessment: Vital Signs Vitals:   05/09/22 1238 05/09/22 1300  BP: (!) 159/51 (!) 138/54  Pulse: 68 68  Resp: 17 20  Temp:    SpO2: 100%  100%    Intake/Output Summary (Last 24 hours) at 05/09/2022 1437 Last data filed at 05/09/2022 0800 Gross per 24 hour  Intake 1500.04 ml  Output 600 ml  Net 900.04 ml   Last Weight  Most recent update: 05/09/2022  4:31 AM    Weight  78.7 kg (173 lb 8 oz)            Gen:  Elderly caucasian F acutely ill in appearence HEENT: ETT, cortrak, dry mucous membranes CV: Regular rate and rhythm  PULM: On mechanical ventilator ABD: soft/nontender, (+) BS EXT: No edema  Neuro: Somnolent  SUMMARY OF RECOMMENDATIONS   Full Code/Full Scope of Care --> Family want to allow the patient a few more days to see if she improves   Conversation held with 4/5 children regarding patients present condition they continue to vacillate on what direction they would desire in terms of care moving forward --> they will continue candid conversations in the oncoming days  Ongoing PMT support  Time Spent: 72 Billing based on MDM: High ______________________________________________________________________ [place] Team Team Cell Phone: [phone] Please utilize secure chat with additional questions, if there is no response within 30 minutes please call the above phone number  Palliative Medicine Team providers are available by phone from 7am to 7pm daily and can be reached through the team cell phone.  Should this patient require assistance outside of these hours, please call the patient's attending physician.

## 2022-05-10 ENCOUNTER — Inpatient Hospital Stay (HOSPITAL_COMMUNITY): Payer: Medicare HMO

## 2022-05-10 DIAGNOSIS — J988 Other specified respiratory disorders: Secondary | ICD-10-CM | POA: Diagnosis not present

## 2022-05-10 DIAGNOSIS — J690 Pneumonitis due to inhalation of food and vomit: Secondary | ICD-10-CM | POA: Diagnosis not present

## 2022-05-10 DIAGNOSIS — I639 Cerebral infarction, unspecified: Secondary | ICD-10-CM | POA: Diagnosis not present

## 2022-05-10 DIAGNOSIS — J9602 Acute respiratory failure with hypercapnia: Secondary | ICD-10-CM | POA: Diagnosis not present

## 2022-05-10 DIAGNOSIS — J9601 Acute respiratory failure with hypoxia: Secondary | ICD-10-CM | POA: Diagnosis not present

## 2022-05-10 DIAGNOSIS — Z515 Encounter for palliative care: Secondary | ICD-10-CM | POA: Diagnosis not present

## 2022-05-10 LAB — BASIC METABOLIC PANEL
Anion gap: 10 (ref 5–15)
BUN: 19 mg/dL (ref 8–23)
CO2: 23 mmol/L (ref 22–32)
Calcium: 8.1 mg/dL — ABNORMAL LOW (ref 8.9–10.3)
Chloride: 103 mmol/L (ref 98–111)
Creatinine, Ser: 0.57 mg/dL (ref 0.44–1.00)
GFR, Estimated: >60 mL/min (ref >60)
Glucose, Bld: 194 mg/dL — ABNORMAL HIGH (ref 70–99)
Potassium: 4.3 mmol/L (ref 3.5–5.1)
Sodium: 136 mmol/L (ref 135–145)

## 2022-05-10 LAB — GLUCOSE, CAPILLARY
Glucose-Capillary: 156 mg/dL — ABNORMAL HIGH (ref 70–99)
Glucose-Capillary: 203 mg/dL — ABNORMAL HIGH (ref 70–99)
Glucose-Capillary: 196 mg/dL — ABNORMAL HIGH (ref 70–99)
Glucose-Capillary: 222 mg/dL — ABNORMAL HIGH (ref 70–99)
Glucose-Capillary: 219 mg/dL — ABNORMAL HIGH (ref 70–99)
Glucose-Capillary: 191 mg/dL — ABNORMAL HIGH (ref 70–99)

## 2022-05-10 LAB — CBC
HCT: 34.3 % — ABNORMAL LOW (ref 36.0–46.0)
Hemoglobin: 11.9 g/dL — ABNORMAL LOW (ref 12.0–15.0)
MCH: 31.2 pg (ref 26.0–34.0)
MCHC: 34.7 g/dL (ref 30.0–36.0)
MCV: 89.8 fL (ref 80.0–100.0)
Platelets: 141 10*3/uL — ABNORMAL LOW (ref 150–400)
RBC: 3.82 MIL/uL — ABNORMAL LOW (ref 3.87–5.11)
RDW: 12.2 % (ref 11.5–15.5)
WBC: 7.4 10*3/uL (ref 4.0–10.5)
nRBC: 0 % (ref 0.0–0.2)

## 2022-05-10 NOTE — Progress Notes (Addendum)
NAME:  [name], MRN:  [id number], DOB:  October 09, 1934, LOS: 5 ADMISSION DATE:  05/05/2022 CONSULTATION DATE:  05/05/2022 REFERRING MD:  Leonel Ramsay - Neuro CHIEF COMPLAINT:  Code Stroke, MV post-L MCA occlusion   History of Present Illness:  88 year old woman who presented to [facility] ED 2/6 as Code Stroke. Presented with R-sided weakness/neglect. LKW 0030 2/6. PMHx significant for HTN, emphysema, T2DM, thyroid disease.  Presented to ED as Code Stroke; noted to have R facial droop, R-sided weakness and aphasia. Following some commands with LUE on ED arrival. LKW ~0030 2/6. CT Head negative for ICH. CTA Head/Neck with left MCA M1 occlusion, poor flow in M2 segments, ?low flow, severe vertebrobasilar stenosis, moderate bilateral PCA stenoses. Out of window for TNK. Taken to [facility] for mechanical thrombectomy with TICI3 revascularization. Left intubated post-procedure.   PCCM consulted for medical management  Pertinent Medical History:  DM type 2, Emphysema, HTN, Hypothyroidism  Significant Hospital Events: Including procedures, antibiotic start and stop dates in addition to other pertinent events   2/06 - Presented to ED as Code Stroke. CT Head negative for ICH. CTA Head/Neck with left MCA M1 occlusion, poor flow in M2 segments, ?low flow, severe vertebrobasilar stenosis, moderate bilateral PCA stenoses. Out of window for TNK. Taken to [facility] for mechanical thrombectomy with TICI3 revascularization. Left intubated post-procedure. PCCM consulted for vent management. 2/07- sedation turned off 20/8 - palliative care consulted 2/09 - change to DNR 2/10 - family changed back to full code  Interim History / Subjective:  Pressure support.  Objective:  Blood pressure (!) 142/54, pulse 68, temperature 99.9 F (37.7 C), temperature source Axillary, resp. rate (!) 21, height 5' 2"$  (1.575 m), weight 79.6 kg, SpO2 100 %.    Vent Mode: PSV;CPAP FiO2 (%):  [30 %-40 %] 30 % Set Rate:  [18 bmp] 18 bmp Vt Set:   [400 mL] 400 mL PEEP:  [5 cmH20] 5 cmH20 Pressure Support:  [5 cmH20-10 cmH20] 10 cmH20 Plateau Pressure:  [15 cmH20] 15 cmH20   Intake/Output Summary (Last 24 hours) at 05/10/2022 [id number] Last data filed at 05/10/2022 0800 Gross per 24 hour  Intake 1840 ml  Output 1075 ml  Net 765 ml   Filed Weights   05/08/22 0500 05/09/22 0430 05/10/22 0500  Weight: 78.7 kg 78.7 kg 79.6 kg   Physical Examination:  General - somnolent Eyes - resists eye opening ENT - ETT in place Cardiac - regular rate/rhythm, 2/6 SM Chest - equal breath sounds b/l, no wheezing or rales Abdomen - soft, non tender, + bowel sounds Extremities - no cyanosis, clubbing, or edema Skin - no rashes Neuro - moves legs and Lt arm with stimulation, Rt arm weak  Resolved Hospital Problem List:    Assessment & Plan:   Compromised airway in setting of CVA. - respiratory mechanics favorable to extubation trial - main concern is mental status and long term ability to protect airway - need to clarify goals of care with family prior to extubation trial - goal SpO2 > 92% - f/u CXR intermittently  Aspiration pneumonitis. - day 4 of unasyn  Acute left MCA stroke s/p mechanical thrombectomy with TICI 3 - per neurology - f/u CT head - prognosis for meaningful recovery seems poor  HTN, chronic diastolic CHF, mod AS, HLD. - goal SBP < 180 for now - continue coreg, lisinopril, crestor  DM type 2 poorly controlled with hyperglycemia. - SSI, semglee  Best Practice: (right click and "Reselect all SmartList Selections" daily)  Diet/type: tube feeds DVT prophylaxis: lovenox GI prophylaxis: protonix Lines: none Foley: none Code Status:  DNR Last date of multidisciplinary goals of care discussion [Palliative care consulted]  Labs:      Latest Ref Rng & Units 05/10/2022    4:11 AM 05/09/2022    2:48 AM 05/08/2022    6:32 AM  CMP  Glucose 70 - 99 mg/dL 194  211  233   BUN 8 - 23 mg/dL 19  16  14   $ Creatinine 0.44 -  1.00 mg/dL 0.57  0.53  0.64   Sodium 135 - 145 mmol/L 136  137  139   Potassium 3.5 - 5.1 mmol/L 4.3  4.0  3.6   Chloride 98 - 111 mmol/L 103  101  103   CO2 22 - 32 mmol/L 23  26  24   $ Calcium 8.9 - 10.3 mg/dL 8.1  8.1  8.0        Latest Ref Rng & Units 05/10/2022    6:01 AM 05/09/2022    2:48 AM 05/08/2022    6:32 AM  CBC  WBC 4.0 - 10.5 K/uL 7.4  7.1  8.1   Hemoglobin 12.0 - 15.0 g/dL 11.9  11.8  12.0   Hematocrit 36.0 - 46.0 % 34.3  35.0  36.2   Platelets 150 - 400 K/uL 141  161  153     ABG    Component Value Date/Time   PHART 7.345 (L) 05/05/2022 1400   PCO2ART 45.7 05/05/2022 1400   PO2ART 100 05/05/2022 1400   HCO3 25.1 05/05/2022 1400   TCO2 26 05/05/2022 1400   ACIDBASEDEF 1.0 05/05/2022 1400   O2SAT 97 05/05/2022 1400    CBG (last 3)  Recent Labs    05/09/22 2312 05/10/22 0306 05/10/22 0721  GLUCAP 184* 156* 203*    Critical care time: 32 minutes  Chesley Mires, MD [place] Pager - [phone] or [phone] 05/10/2022, 9:17 AM

## 2022-05-10 NOTE — Progress Notes (Signed)
Patient transported to CT and back without complications. RN at bedside.  

## 2022-05-10 NOTE — Progress Notes (Addendum)
Palliative Medicine Inpatient Follow Up Note  HPI: [name] is a 88 y.o. female  with a h/o dementia, DM, emphysema, HTN, thyroid dz, and prior UTI(s). Admitted d/t inability to move R side. MRI showed that she had suffered a large left MCA territory stroke s/p mechanical thrombectomy of occluded left MCA, and TICI 3 flow was achieved after 1 pass. Now with hemorrhagic transformation.    Palliative care has been asked to get involved for discussions as related to goals of care moving forward.   Today's Discussion 05/10/2022  *Please note that this is a verbal dictation therefore any spelling or grammatical errors are due to the "[place]" system interpretation.  Chart reviewed inclusive of vital signs, progress notes, laboratory results, and diagnostic images.   I spoke to patients RN, [name] who shares patient has remained about the same.   I met with [name] this morning. She remains able to move her L side and can intermittently open her eyes though she is not tracking nor is she following commands.   I was able to call patients oldest daughter, [name] this afternoon. She and I reviewed that there have been no significant changes in her mothers clinical situation. [name] shares that she remains in flux with her family as she doesn't want to see her mother suffer but her other two family members - [name] and [name], [name] are desiring continued full scope efforts. She shares [name] is financially dependent on [employer] living.  We discussed arranging a phone meeting on Tuesday at [place] for further conversations and so there is a direction in terms of what to do when patient is extubated.   Questions and concerns addressed/Palliative Support Provided.   Objective Assessment: Vital Signs Vitals:   05/10/22 1100 05/10/22 1200  BP: (!) 124/103 (!) 118/34  Pulse: (!) 58 60  Resp:    Temp:  99 F (37.2 C)  SpO2: 100% 100%    Intake/Output Summary (Last 24 hours) at 05/10/2022 1317 Last  data filed at 05/10/2022 1200 Gross per 24 hour  Intake 2180 ml  Output 1275 ml  Net 905 ml    Last Weight  Most recent update: 05/10/2022  5:27 AM    Weight  79.6 kg (175 lb 7.8 oz)            Gen:  Elderly caucasian F acutely ill in appearence HEENT: ETT, cortrak, dry mucous membranes CV: Regular rate and rhythm  PULM: On mechanical ventilator ABD: soft/nontender, (+) BS EXT: No edema  Neuro: Somnolent  SUMMARY OF RECOMMENDATIONS   Full Code/Full Scope of Care --> Family want to allow the patient a few more days to see if she improves   Conversations held on 2/10 with 4/5 children regarding patients present condition they continue to vacillate on what direction they would desire in terms of care moving forward --> they will continue candid conversations in the oncoming days. Patients son [name] and daughter, [name] live with patient in an extended stay motel, they desire giving the patient more time. Patients daughters, [name] and Maria Chars do not want to see their mother suffer.   Plan for phone meeting on Tuesday at [place] to gain better clarity on goals when patient is extubated  Ongoing PMT support  Billing based on MDM: High ______________________________________________________________________ [place] Team Team Cell Phone: [phone] Please utilize secure chat with additional questions, if there is no response within 30 minutes please call the above phone number  Palliative  Medicine Team providers are available by phone from 7am to 7pm daily and can be reached through the team cell phone.  Should this patient require assistance outside of these hours, please call the patient's attending physician.

## 2022-05-10 NOTE — Progress Notes (Signed)
STROKE TEAM PROGRESS NOTE   INTERVAL HISTORY No family is at the bedside. Pt still intubated on weaning, not open eyes on voice, but moving LUE and BLEs on pain stimulation.   Vitals:   05/10/22 1000 05/10/22 1045 05/10/22 1100 05/10/22 1200  BP: (!) 179/61 (!) 121/41 (!) 124/103 (!) 118/34  Pulse: 67 (!) 59 (!) 58 60  Resp:      Temp:      TempSrc:      SpO2: 100% 100% 100% 100%  Weight:      Height:       CBC:  Recent Labs  Lab 05/05/22 1000 05/05/22 1011 05/06/22 0505 05/07/22 0847 05/09/22 0248 05/10/22 0601  WBC 5.5  --  9.3   < > 7.1 7.4  NEUTROABS 3.5  --  6.7  --   --   --   HGB 13.9   < > 12.6   < > 11.8* 11.9*  HCT 39.7   < > 37.7   < > 35.0* 34.3*  MCV 90.6  --  91.1   < > 91.6 89.8  PLT 221  --  203   < > 161 141*   < > = values in this interval not displayed.   Basic Metabolic Panel:  Recent Labs  Lab 05/07/22 1810 05/08/22 0632 05/09/22 0248 05/10/22 0411  NA  --  139 137 136  K  --  3.6 4.0 4.3  CL  --  103 101 103  CO2  --  24 26 23  $ GLUCOSE  --  233* 211* 194*  BUN  --  14 16 19  $ CREATININE  --  0.64 0.53 0.57  CALCIUM  --  8.0* 8.1* 8.1*  MG 2.1 2.2  --   --   PHOS 2.0* 2.6  --   --    Lipid Panel:  Recent Labs  Lab 05/06/22 0505  CHOL 176  TRIG 172*  177*  HDL 30*  CHOLHDL 5.9  VLDL 34  LDLCALC 112*   HgbA1c:  Recent Labs  Lab 05/05/22 1000  HGBA1C 9.6*   Urine Drug Screen:  Recent Labs  Lab 05/05/22 1152  LABOPIA NONE DETECTED  COCAINSCRNUR NONE DETECTED  LABBENZ NONE DETECTED  AMPHETMU NONE DETECTED  THCU NONE DETECTED  LABBARB NONE DETECTED    Alcohol Level  Recent Labs  Lab 05/05/22 1000  ETH <10    IMAGING past 24 hours CT HEAD WO CONTRAST (5MM)  Result Date: 05/10/2022 CLINICAL DATA:  Stroke, follow up.  Recent left MCA infarct. EXAM: CT HEAD WITHOUT CONTRAST TECHNIQUE: Contiguous axial images were obtained from the base of the skull through the vertex without intravenous contrast. RADIATION DOSE  REDUCTION: This exam was performed according to the departmental dose-optimization program which includes automated exposure control, adjustment of the mA and/or kV according to patient size and/or use of iterative reconstruction technique. COMPARISON:  Head MRI 05/06/2022 FINDINGS: Brain: A large left MCA infarct is unchanged in extent from the prior MRI. There is well-defined cytotoxic edema. Associated confluent petechial hemorrhage in the left basal ganglia is unchanged in extent from the prior MRI. Associated mass effect has at most minimally increased, with rightward midline shift now measuring 4 mm (previously 3 mm). No new infarct, hydrocephalus, or extra-axial fluid collection is evident. Hypodensities elsewhere in the cerebral white matter bilaterally are nonspecific but compatible with chronic small vessel ischemic disease. Vascular: Calcified atherosclerosis at the skull base. Skull: No acute fracture or suspicious osseous lesion. Sinuses/Orbits:  Mucosal thickening and/or small volume fluid in the right sphenoid sinus. Clear mastoid air cells. Bilateral cataract extraction. Other: None. IMPRESSION: 1. Unchanged extent of a large left MCA infarct with confluent petechial hemorrhage in the basal ganglia. 2. Stable to minimally increased mass effect with 4 mm of rightward midline shift. 3. No new intracranial abnormality. Electronically Signed   By: Logan Bores M.D.   On: 05/10/2022 10:46    PHYSICAL EXAM   Temp:  [98 F (36.7 C)-99.9 F (37.7 C)] 99.9 F (37.7 C) (02/11 0800) Pulse Rate:  [55-73] 60 (02/11 1200) Resp:  [17-21] 21 (02/11 0116) BP: (105-179)/(34-103) 118/34 (02/11 1200) SpO2:  [100 %] 100 % (02/11 1200) FiO2 (%):  [30 %-40 %] 30 % (02/11 1045) Weight:  [79.6 kg] 79.6 kg (02/11 0500)  General - Well nourished, well developed, intubated off sedation.  Ophthalmologic - fundi not visualized due to noncooperation.  Cardiovascular - Regular rate and rhythm.  Neuro - intubated  off sedation, eyes closed not open with pain stimulation, not following commands. With forced eye opening, eyes in left gaze position, not blinking to visual threat, not tracking, PERRL. Corneal reflex present, gag and cough present. Breathing over the vent.  Facial symmetry not able to test due to ET tube.  Tongue protrusion not cooperative. On pain stimulation, withdraw BLEs equally and LUE vigorously but not moving RUE. Sensation, coordination and gait not tested.    ASSESSMENT/PLAN [name] is a 88 y.o. female with history of diabetes, emphysema and hypertension presenting with inability to move her right side.  TNK was not given as she presented outside the window.  She was taken to interventional radiology for mechanical thrombectomy of occluded left MCA, and TICI 3 flow was achieved after 1 pass.  MRI shows left MCA territory infarct with some hemorrhagic transformation.  Family meeting 2/9, now DNR. She has been off sedation since 2/7  Stroke:  large left MCA stroke with mild HT due to left M1 occlusion s/p IR with TICI3, etiology possibly large vessel stenosis, however, cardioembolic source can not be excluded. CT head acute infarct in the left MCA territory ASPECTS 5-6 CTA head & neck acute occlusion of left M1 segment with poor opacifications of M2 segments, severe stenosis of vertebrobasilar junction and proximal basilar artery, multifocal mild to moderate stenoses in bilateral PCA territory, severe stenosis at left V3 V4 junction MRI left MCA territory infarct with hemorrhagic transformation CT repeat 2/11 stable large left MCA infarct with confluent petechial hemorrhage in the BG, stable 23m rightward MLS. 2D Echo EF 70 to 75%  LDL 112 HgbA1c 9.6 VTE prophylaxis - Lovenox   No antithrombotic prior to admission, now on ASA 325 Therapy recommendations: Pending Disposition: Pending, goals of care conversations with family ongoing. Per family, pt would like full code. Family  requested more time to assess her condition before committed to extubation and plan if not tolerating extubation.   Respiratory failure ? Aspiration penumonia Patient left intubated after procedure Ventilator management per CCM On weaning now Extubate when able Tmax of 101.4-> 102.2 -> 100.4->afebrile On Unasyn  UA negative No leukocytosis  Hypertension Home meds: Lisinopril 20 mg daily Stable On coreg 12.5, lisnopril 20 Long-term BP goal normotensive  Hyperlipidemia Home meds: None LDL 112, goal < 70 rosuvastatin 20 mg daily Continue statin at discharge  Diabetes type II Uncontrolled Home meds: Glipizide 10 mg twice daily HgbA1c 9.6, goal < 7.0 CBGs SSI Recommend close PCP follow-up for better diabetes  control  Dysphagia  Intubated now On TF @ 60  Other Stroke Risk Factors Advanced Age >/= 79  Obesity, Body mass index is 32.1 kg/m., BMI >/= 30 associated with increased stroke risk, recommend weight loss, diet and exercise as appropriate   Other Active Problems Emphysema  Hospital day # 5  This patient is critically ill due to large left MCA stroke with hemorrhagic transformation, respiratory failure with intubation, fever, hyperglycemia and at significant risk of neurological worsening, death form recurrent stroke, hemorrhagic transformation, cerebral edema, renal failure, sepsis, DKA. This patient's care requires constant monitoring of vital signs, hemodynamics, respiratory and cardiac monitoring, review of multiple databases, neurological assessment, discussion with family, other specialists and medical decision making of high complexity. I spent 30 minutes of neurocritical care time in the care of this patient. I had long discussion with daughters and son at bedside as well as daughter on the phone, updated pt current condition, treatment plan and potential prognosis, and answered all the questions.  They expressed understanding and appreciation.  I also discussed  with palliative care NP.  Rosalin Hawking, MD PhD Stroke Neurology 05/10/2022 12:13 PM    To contact Stroke Continuity provider, please refer to [email or web address] After hours, contact General Neurology

## 2022-05-11 DIAGNOSIS — I639 Cerebral infarction, unspecified: Secondary | ICD-10-CM | POA: Diagnosis not present

## 2022-05-11 LAB — GLUCOSE, CAPILLARY
Glucose-Capillary: 204 mg/dL — ABNORMAL HIGH (ref 70–99)
Glucose-Capillary: 144 mg/dL — ABNORMAL HIGH (ref 70–99)
Glucose-Capillary: 180 mg/dL — ABNORMAL HIGH (ref 70–99)
Glucose-Capillary: 209 mg/dL — ABNORMAL HIGH (ref 70–99)
Glucose-Capillary: 224 mg/dL — ABNORMAL HIGH (ref 70–99)
Glucose-Capillary: 230 mg/dL — ABNORMAL HIGH (ref 70–99)

## 2022-05-11 MED ORDER — LABETALOL HCL 5 MG/ML IV SOLN: 10.0000 mg | INTRAVENOUS | Status: DC | PRN

## 2022-05-11 NOTE — Progress Notes (Signed)
NAME:  [name], MRN:  [id number], DOB:  12/05/1934, LOS: 6 ADMISSION DATE:  05/05/2022 CONSULTATION DATE:  05/05/2022 REFERRING MD:  Leonel Ramsay - Neuro CHIEF COMPLAINT:  Code Stroke, MV post-L MCA occlusion   History of Present Illness:  88 year old woman who presented to [facility] ED 2/6 as Code Stroke. Presented with R-sided weakness/neglect. LKW 0030 2/6. PMHx significant for HTN, emphysema, T2DM, thyroid disease.  Presented to ED as Code Stroke; noted to have R facial droop, R-sided weakness and aphasia. Following some commands with LUE on ED arrival. LKW ~0030 2/6. CT Head negative for ICH. CTA Head/Neck with left MCA M1 occlusion, poor flow in M2 segments, ?low flow, severe vertebrobasilar stenosis, moderate bilateral PCA stenoses. Out of window for TNK. Taken to [facility] for mechanical thrombectomy with TICI3 revascularization. Left intubated post-procedure.   PCCM consulted for medical management  Pertinent Medical History:  DM type 2, Emphysema, HTN, Hypothyroidism  Significant Hospital Events: Including procedures, antibiotic start and stop dates in addition to other pertinent events   2/06 - Presented to ED as Code Stroke. CT Head negative for ICH. CTA Head/Neck with left MCA M1 occlusion, poor flow in M2 segments, ?low flow, severe vertebrobasilar stenosis, moderate bilateral PCA stenoses. Out of window for TNK. Taken to [facility] for mechanical thrombectomy with TICI3 revascularization. Left intubated post-procedure. PCCM consulted for vent management. 2/07- sedation turned off 20/8 - palliative care consulted 2/09 - change to DNR 2/10 - family changed back to full code 2/13 planned family meeting with pallaitive   Interim History / Subjective:   Off sedation, remains on mechanical ventilation .  Objective:  Blood pressure (!) 135/55, pulse 98, temperature 99.2 F (37.3 C), temperature source Axillary, resp. rate 20, height 5' 2"$  (1.575 m), weight 79.6 kg, SpO2 99 %.    Vent Mode:  PSV;CPAP FiO2 (%):  [30 %-40 %] 30 % Set Rate:  [18 bmp] 18 bmp Vt Set:  [400 mL] 400 mL PEEP:  [5 cmH20] 5 cmH20 Pressure Support:  [10 cmH20] 10 cmH20 Plateau Pressure:  [15 cmH20-16 cmH20] 16 cmH20   Intake/Output Summary (Last 24 hours) at 05/11/2022 0801 Last data filed at 05/11/2022 0600 Gross per 24 hour  Intake 1720 ml  Output 1000 ml  Net 720 ml   Filed Weights   05/08/22 0500 05/09/22 0430 05/10/22 0500  Weight: 78.7 kg 78.7 kg 79.6 kg   Physical Examination:  General - eldelry fm, intubated on life support Eyes - opens eye to voice  ENT - ETT in place  Cardiac - RRR, systolic murmur faint  Chest- BL vented breaths  Abdomen - soft nt nd  Extremities - no significant edema, SCDs in place Skin - scaling skin  Neuro - no movement of right UE, left in restraint   Resolved Hospital Problem List:    Assessment & Plan:   Compromised airway in setting of CVA. - respiratory mechanics favorable to extubation trial - main concern is mental status and long term ability to protect airway - need to clarify goals of care with family prior to extubation trial P: Family meeting on Tuesday per palliative care team  Wean peep and FIO2 spo2 goal >92%  Aspiration pneumonitis. - complete 7 Days of unasyn, D5/7  Acute left MCA stroke s/p mechanical thrombectomy with TICI 3 - care per neurology  - antiplatelets per neuro  - to help guide family discussions around meaningful recovery   HTN, chronic diastolic CHF, mod AS, HLD. - goal SBP <  180 for now - continue coreg and statin   DM type 2 poorly controlled with hyperglycemia. - CBGs with SSI + symglee   Best Practice: (right click and "Reselect all SmartList Selections" daily)   Diet/type: tube feeds DVT prophylaxis: lovenox GI prophylaxis: protonix Lines: none Foley: none Code Status:  FULL code  Last date of multidisciplinary goals of care discussion [Palliative care consulted]  Labs:      Latest Ref Rng &  Units 05/10/2022    4:11 AM 05/09/2022    2:48 AM 05/08/2022    6:32 AM  CMP  Glucose 70 - 99 mg/dL 194  211  233   BUN 8 - 23 mg/dL 19  16  14   $ Creatinine 0.44 - 1.00 mg/dL 0.57  0.53  0.64   Sodium 135 - 145 mmol/L 136  137  139   Potassium 3.5 - 5.1 mmol/L 4.3  4.0  3.6   Chloride 98 - 111 mmol/L 103  101  103   CO2 22 - 32 mmol/L 23  26  24   $ Calcium 8.9 - 10.3 mg/dL 8.1  8.1  8.0        Latest Ref Rng & Units 05/10/2022    6:01 AM 05/09/2022    2:48 AM 05/08/2022    6:32 AM  CBC  WBC 4.0 - 10.5 K/uL 7.4  7.1  8.1   Hemoglobin 12.0 - 15.0 g/dL 11.9  11.8  12.0   Hematocrit 36.0 - 46.0 % 34.3  35.0  36.2   Platelets 150 - 400 K/uL 141  161  153     ABG    Component Value Date/Time   PHART 7.345 (L) 05/05/2022 1400   PCO2ART 45.7 05/05/2022 1400   PO2ART 100 05/05/2022 1400   HCO3 25.1 05/05/2022 1400   TCO2 26 05/05/2022 1400   ACIDBASEDEF 1.0 05/05/2022 1400   O2SAT 97 05/05/2022 1400    CBG (last 3)  Recent Labs    05/10/22 1912 05/10/22 2302 05/11/22 0320  GLUCAP 219* 191* 204*     This patient is critically ill with multiple organ system failure; which, requires frequent high complexity decision making, assessment, support, evaluation, and titration of therapies. This was completed through the application of advanced monitoring technologies and extensive interpretation of multiple databases. During this encounter critical care time was devoted to patient care services described in this note for 31 minutes.  Garner Nash, DO [facility] 05/11/2022 8:01 AM

## 2022-05-11 NOTE — Progress Notes (Signed)
Palliative:  Noted changes in goals of care over the weekend. Plans for repeat family meeting/conference call 05/12/22 1300. Discussed with RN briefly and no changes in patient status.   No charge  Vinie Sill, NP Palliative Medicine Team Pager [phone] (Please see [email or web address] for schedule) Team Phone [phone]

## 2022-05-11 NOTE — Progress Notes (Signed)
STROKE TEAM PROGRESS NOTE   INTERVAL HISTORY No family is at the bedside. Pt remains  intubated for respiratory failure.  And neurological exam remains poor with patient, not opening eyes on voice, but moving LUE and BLEs on pain stimulation.  Vital signs stable.  CT scan of the head yesterday shows unchanged large left MCA infarct with confluent petechial hemorrhage in the basal ganglia and stable 4 mm rightward midline shift. Vitals:   05/11/22 1115 05/11/22 1130 05/11/22 1145 05/11/22 1200  BP: (!) 127/43 (!) 117/47 (!) 112/41 (!) 115/38  Pulse: (!) 58 (!) 58 (!) 57 (!) 59  Resp:    20  Temp:    98.9 F (37.2 C)  TempSrc:    Oral  SpO2: 100% 100% 100% 100%  Weight:      Height:       CBC:  Recent Labs  Lab 05/05/22 1000 05/05/22 1011 05/06/22 0505 05/07/22 0847 05/09/22 0248 05/10/22 0601  WBC 5.5  --  9.3   < > 7.1 7.4  NEUTROABS 3.5  --  6.7  --   --   --   HGB 13.9   < > 12.6   < > 11.8* 11.9*  HCT 39.7   < > 37.7   < > 35.0* 34.3*  MCV 90.6  --  91.1   < > 91.6 89.8  PLT 221  --  203   < > 161 141*   < > = values in this interval not displayed.   Basic Metabolic Panel:  Recent Labs  Lab 05/07/22 1810 05/08/22 0632 05/09/22 0248 05/10/22 0411  NA  --  139 137 136  K  --  3.6 4.0 4.3  CL  --  103 101 103  CO2  --  24 26 23  $ GLUCOSE  --  233* 211* 194*  BUN  --  14 16 19  $ CREATININE  --  0.64 0.53 0.57  CALCIUM  --  8.0* 8.1* 8.1*  MG 2.1 2.2  --   --   PHOS 2.0* 2.6  --   --    Lipid Panel:  Recent Labs  Lab 05/06/22 0505  CHOL 176  TRIG 172*  177*  HDL 30*  CHOLHDL 5.9  VLDL 34  LDLCALC 112*   HgbA1c:  Recent Labs  Lab 05/05/22 1000  HGBA1C 9.6*   Urine Drug Screen:  Recent Labs  Lab 05/05/22 1152  LABOPIA NONE DETECTED  COCAINSCRNUR NONE DETECTED  LABBENZ NONE DETECTED  AMPHETMU NONE DETECTED  THCU NONE DETECTED  LABBARB NONE DETECTED    Alcohol Level  Recent Labs  Lab 05/05/22 1000  ETH <10    IMAGING past 24 hours No  results found.  PHYSICAL EXAM   Temp:  [98.9 F (37.2 C)-100.5 F (38.1 C)] 98.9 F (37.2 C) (02/12 1200) Pulse Rate:  [56-98] 59 (02/12 1200) Resp:  [20-24] 20 (02/12 1200) BP: (109-193)/(38-95) 115/38 (02/12 1200) SpO2:  [99 %-100 %] 100 % (02/12 1200) FiO2 (%):  [30 %-40 %] 30 % (02/12 0753)  General - Well nourished, well developed, intubated off sedation.  Ophthalmologic - fundi not visualized due to noncooperation.  Cardiovascular - Regular rate and rhythm.  Neuro - intubated off sedation, eyes closed not open with pain stimulation, globally aphasic and not following commands. With forced eye opening, eyes in left gaze position, not blinking to visual threat, not tracking, PERRL. Corneal reflex present, gag and cough present. Breathing over the vent.  Facial symmetry not  able to test due to ET tube.  Tongue protrusion not cooperative. On pain stimulation, withdraw BLEs equally and LUE vigorously but not moving RUE. Sensation, coordination and gait not tested.    ASSESSMENT/PLAN [name] is a 88 y.o. female with history of diabetes, emphysema and hypertension presenting with inability to move her right side.  TNK was not given as she presented outside the window.  She was taken to interventional radiology for mechanical thrombectomy of occluded left MCA, and TICI 3 flow was achieved after 1 pass.  MRI shows left MCA territory infarct with some hemorrhagic transformation.  Family meeting 2/9, now DNR. She has been off sedation since 2/7  Stroke:  large left MCA stroke with mild HT due to left M1 occlusion s/p IR with TICI3, etiology possibly large vessel stenosis, however, cardioembolic source can not be excluded. CT head acute infarct in the left MCA territory ASPECTS 5-6 CTA head & neck acute occlusion of left M1 segment with poor opacifications of M2 segments, severe stenosis of vertebrobasilar junction and proximal basilar artery, multifocal mild to moderate stenoses  in bilateral PCA territory, severe stenosis at left V3 V4 junction MRI left MCA territory infarct with hemorrhagic transformation CT repeat 2/11 stable large left MCA infarct with confluent petechial hemorrhage in the BG, stable 20m rightward MLS. 2D Echo EF 70 to 75%  LDL 112 HgbA1c 9.6 VTE prophylaxis - Lovenox   No antithrombotic prior to admission, now on ASA 325 Therapy recommendations: Pending Disposition: Pending, goals of care conversations with family ongoing. Per family, pt would like full code. Family requested more time to assess her condition before committed to extubation and plan if not tolerating extubation.   Respiratory failure ? Aspiration penumonia Patient left intubated after procedure Ventilator management per CCM On weaning now Extubate when able Tmax of 101.4-> 102.2 -> 100.4->afebrile On Unasyn  UA negative No leukocytosis  Hypertension Home meds: Lisinopril 20 mg daily Stable On coreg 12.5, lisnopril 20 Long-term BP goal normotensive  Hyperlipidemia Home meds: None LDL 112, goal < 70 rosuvastatin 20 mg daily Continue statin at discharge  Diabetes type II Uncontrolled Home meds: Glipizide 10 mg twice daily HgbA1c 9.6, goal < 7.0 CBGs SSI Recommend close PCP follow-up for better diabetes control  Dysphagia  Intubated now On TF @ 655 Other Stroke Risk Factors Advanced Age >/= 654 Obesity, Body mass index is 32.1 kg/m., BMI >/= 30 associated with increased stroke risk, recommend weight loss, diet and exercise as appropriate   Other Active Problems Emphysema  Hospital day # 6  Patient's neurological exam remains poor but global aphasia and dense hemiplegia with CT scan showing large left MCA infarct with mild cytotoxic edema and midline shift.  Patient prognosis is quite poor.  Family struggling with decision about comfort care and goals of care.  Family meeting scheduled for tomorrow.  Discussed with Dr. IValeta Harmscritical care MD..This patient  is critically ill and at significant risk of neurological worsening, death and care requires constant monitoring of vital signs, hemodynamics,respiratory and cardiac monitoring, extensive review of multiple databases, frequent neurological assessment, discussion with family, other specialists and medical decision making of high complexity.I have made any additions or clarifications directly to the above note.This critical care time does not reflect procedure time, or teaching time or supervisory time of PA/NP/Med Resident etc but could involve care discussion time.  I spent 30 minutes of neurocritical care time  in the care of  this patient.  Antony Contras Stroke Neurology 05/11/2022 12:17 PM    To contact Stroke Continuity provider, please refer to [email or web address] After hours, contact General Neurology

## 2022-05-11 NOTE — Progress Notes (Signed)
  Transition of Care [facility]) Screening Note   Patient Details  Name: [name] Date of Birth: 1934/08/30   Transition of Care [facility]) CM/SW Contact:    Benard Halsted, LCSW Phone Number: 05/11/2022, 8:16 AM    Transition of Care Department [facility]) has reviewed patient. We will continue to monitor patient advancement through interdisciplinary progression rounds. If new patient transition needs arise, please place a TOC consult.

## 2022-05-12 DIAGNOSIS — Z515 Encounter for palliative care: Secondary | ICD-10-CM | POA: Diagnosis not present

## 2022-05-12 DIAGNOSIS — Z7189 Other specified counseling: Secondary | ICD-10-CM | POA: Diagnosis not present

## 2022-05-12 DIAGNOSIS — I639 Cerebral infarction, unspecified: Secondary | ICD-10-CM | POA: Diagnosis not present

## 2022-05-12 LAB — GLUCOSE, CAPILLARY
Glucose-Capillary: 172 mg/dL — ABNORMAL HIGH (ref 70–99)
Glucose-Capillary: 182 mg/dL — ABNORMAL HIGH (ref 70–99)
Glucose-Capillary: 210 mg/dL — ABNORMAL HIGH (ref 70–99)
Glucose-Capillary: 166 mg/dL — ABNORMAL HIGH (ref 70–99)
Glucose-Capillary: 177 mg/dL — ABNORMAL HIGH (ref 70–99)
Glucose-Capillary: 222 mg/dL — ABNORMAL HIGH (ref 70–99)

## 2022-05-12 NOTE — Progress Notes (Signed)
Palliative:  HPI: 88 y.o. female  with a h/o dementia, DM, emphysema, HTN, thyroid dz, and prior UTI(s). Admitted d/t inability to move R side. MRI showed that she had suffered a large left MCA territory stroke s/p mechanical thrombectomy of occluded left MCA, and TICI 3 flow was achieved after 1 pass. Now with hemorrhagic transformation. Palliative care has been asked to get involved for discussions as related to goals of care moving forward.    I met today at Ms. Kempen's bedside. Her neurological status is unchanged from when I last saw her on 2/9. RN reports no changes. Reviewed notes and no changes. Prognosis remains poor.   I had a conference call scheduled by one of my colleagues today at 1pm. I completed call but only 2 daughters, Lucille Passy and Georgiann Hahn, called in. They could not give any information on other family and why they are not participating in discussions. We continued the conversation with the 2 children participating in discussion today. We reviewed Ms. Straley's significant injury from stroke and expectations that she will not be anticipated to be able to walk or swallow or even communicate with [place] appropriately. We discussed poor prognosis and poor quality of life. I encouraged family to consider her quality of life and if this is what she would want for herself. I encouraged them to consider if we would be helping her by putting her through more interventions that would likely be uncomfortable for her if she continues in poor condition.   Family shares that they would like to remove ventilator support and see how she does. I tried to explain the importance of having a plan to know how to best care for Ms. Crall if ventilator is removed and she worsens. We discussed if she would want to be placed back on the ventilator as this would be a very bad sign that she is worsening and there would be no good options from there. Georgiann Hahn asks about tracheostomy. I explained why tracheostomy is utilized to provide  long term ventilator support but does not reverse the damage to the brain to improve her condition. Family tells me that they want to give her more time to see if she can improve. I tried to be very clear about our expectations moving forward. Family are fixated on the fact that Ms. Holtmeyer has some movement of her left side and they see this as improvement and hope that she may improve further. I explained that this has been consistent and no improvement or changes since last week. At this time they are requesting ongoing full code, full scope.   I expressed concern that I had a much different conversation last week with their brother (he clearly expressed that his mother would not desire these measures). I expressed concern that family has not participated in having a conversation together so that we can develop a consistent plan for Ms. Krenn. I explained that I will share their wishes expressed today with the team and that we should try and talk again and continue discussions. We will continue to work with them to ensure we have a cohesive plan moving forward.   All questions/concerns addressed. Emotional support provided. Discussed with RN, Dr. Delton Coombes, Dr. Pearlean Brownie.   Exam: Unresponsive. Does not follow commands. Resists L > R eye when I try to life eyelid. She does not look at me. Weaning on vent. No distress. Moves towards noxious stimuli on left side but no clear purposeful movements.  Slight withdraw to painful stimuli  on R side.   Plan: - Full code, full scope.  - Differing messages from family members during my two meetings.  - Family unable/unwilling to have a cohesive conversation with majority of the 5 adult children. However, they do all desire to have a say in decisions when they come to visit.  - Recommend further conversations.   50 min  Yong Channel, NP Palliative Medicine Team Pager [phone] (Please see [email or web address] for schedule) Team Phone [phone]    Greater than 50%  of  this time was spent counseling and coordinating care related to the above assessment and plan

## 2022-05-12 NOTE — Progress Notes (Addendum)
STROKE TEAM PROGRESS NOTE   INTERVAL HISTORY Patient is seen in her room with no family at the bedside.  Vital signs have been stable overnight, and neurological exam remains poor.  Family meeting to be held with palliative care today to discuss goals of care. Vitals:   05/12/22 1000 05/12/22 1015 05/12/22 1030 05/12/22 1100  BP: (!) 112/42 (!) 122/42 (!) 158/57   Pulse: (!) 55 (!) 56 66 68  Resp: 18   (!) 25  Temp:      TempSrc:      SpO2: 100% 100% 100% 100%  Weight:      Height:       CBC:  Recent Labs  Lab 05/06/22 0505 05/07/22 0847 05/09/22 0248 05/10/22 0601  WBC 9.3   < > 7.1 7.4  NEUTROABS 6.7  --   --   --   HGB 12.6   < > 11.8* 11.9*  HCT 37.7   < > 35.0* 34.3*  MCV 91.1   < > 91.6 89.8  PLT 203   < > 161 141*   < > = values in this interval not displayed.    Basic Metabolic Panel:  Recent Labs  Lab 05/07/22 1810 05/08/22 0632 05/09/22 0248 05/10/22 0411  NA  --  139 137 136  K  --  3.6 4.0 4.3  CL  --  103 101 103  CO2  --  24 26 23  $ GLUCOSE  --  233* 211* 194*  BUN  --  14 16 19  $ CREATININE  --  0.64 0.53 0.57  CALCIUM  --  8.0* 8.1* 8.1*  MG 2.1 2.2  --   --   PHOS 2.0* 2.6  --   --     Lipid Panel:  Recent Labs  Lab 05/06/22 0505  CHOL 176  TRIG 172*  177*  HDL 30*  CHOLHDL 5.9  VLDL 34  LDLCALC 112*    HgbA1c:  No results for input(s): "HGBA1C" in the last 168 hours.  Urine Drug Screen:  Recent Labs  Lab 05/05/22 1152  LABOPIA NONE DETECTED  COCAINSCRNUR NONE DETECTED  LABBENZ NONE DETECTED  AMPHETMU NONE DETECTED  THCU NONE DETECTED  LABBARB NONE DETECTED     Alcohol Level  No results for input(s): "ETH" in the last 168 hours.   IMAGING past 24 hours No results found.  PHYSICAL EXAM   Temp:  [98 F (36.7 C)-99.5 F (37.5 C)] 99.2 F (37.3 C) (02/13 0800) Pulse Rate:  [53-83] 68 (02/13 1100) Resp:  [18-25] 25 (02/13 1100) BP: (108-186)/(38-92) 158/57 (02/13 1030) SpO2:  [97 %-100 %] 100 % (02/13 1100) FiO2  (%):  [30 %-40 %] 40 % (02/13 1100)  General - Well nourished, well developed, intubated off sedation.  Ophthalmologic - fundi not visualized due to noncooperation.  Cardiovascular - Regular rate and rhythm.  Neuro (intubated without sedation)-patient rests with eyes closed, left gaze deviation, does not follow commands, globally aphasic.  Pupils equal round and reactive to light.  Gag and cough present.  Patient will localize noxious stimuli with left upper extremity and moves left upper and lower extremities purposefully, no movement of right upper extremity, withdraws right lower extremity to noxious stimulation    ASSESSMENT/PLAN [name] is a 88 y.o. female with history of diabetes, emphysema and hypertension presenting with inability to move her right side.  TNK was not given as she presented outside the window.  She was taken to interventional radiology for  mechanical thrombectomy of occluded left MCA, and TICI 3 flow was achieved after 1 pass.  MRI shows left MCA territory infarct with some hemorrhagic transformation.  Family meeting 2/9, was made DNR, now is full code again. She has been off sedation since 2/7.  Family meeting to be held again today with palliative care.  Stroke:  large left MCA stroke with mild HT due to left M1 occlusion s/p IR with TICI3, etiology possibly large vessel stenosis, however, cardioembolic source can not be excluded. CT head acute infarct in the left MCA territory ASPECTS 5-6 CTA head & neck acute occlusion of left M1 segment with poor opacifications of M2 segments, severe stenosis of vertebrobasilar junction and proximal basilar artery, multifocal mild to moderate stenoses in bilateral PCA territory, severe stenosis at left V3 V4 junction MRI left MCA territory infarct with hemorrhagic transformation CT repeat 2/11 stable large left MCA infarct with confluent petechial hemorrhage in the BG, stable 38m rightward MLS. 2D Echo EF 70 to 75%  LDL  112 HgbA1c 9.6 VTE prophylaxis - Lovenox   No antithrombotic prior to admission, now on ASA 325 Therapy recommendations: Pending Disposition: Pending, goals of care conversations with family ongoing. Per family, pt would like full code. Family requested more time to assess her condition before committed to extubation and plan if not tolerating extubation.   Respiratory failure ? Aspiration penumonia Patient left intubated after procedure Ventilator management per CCM On weaning now Extubate when able Tmax of 101.4-> 102.2 -> 100.4->afebrile On Unasyn  UA negative No leukocytosis  Hypertension Home meds: Lisinopril 20 mg daily Stable On coreg 12.5, lisnopril 20 Long-term BP goal normotensive  Hyperlipidemia Home meds: None LDL 112, goal < 70 rosuvastatin 20 mg daily Continue statin at discharge  Diabetes type II Uncontrolled Home meds: Glipizide 10 mg twice daily HgbA1c 9.6, goal < 7.0 CBGs SSI Recommend close PCP follow-up for better diabetes control  Dysphagia  Intubated now On TF @ 62 Other Stroke Risk Factors Advanced Age >/= 626 Obesity, Body mass index is 32.1 kg/m., BMI >/= 30 associated with increased stroke risk, recommend weight loss, diet and exercise as appropriate   Other Active Problems Emphysema  Hospital day # 7  Patient seen by NP and then by MD, MD to add a note as needed.CTemple, MSN, AGACNP-BC [facility] See Amion for schedule and pager information 05/12/2022 11:40 AM   I have personally obtained history,examined this patient, reviewed notes, independently viewed imaging studies, participated in medical decision making and plan of care.ROS completed by me personally and pertinent positives fully documented  I have made any additions or clarifications directly to the above note. Agree with note above.  Patient remains aphasic with right hemiplegia with poor neurological prognosis and unlikely to survive without  prolonged ventilatory support, tracheostomy, feeding tube and nursing home care.  Family meeting with palliative care team hopefully later today to make decision on goals of care.  Discussed with Dr. BLamonte Sakaicritical care medicine. This patient is critically ill and at significant risk of neurological worsening, death and care requires constant monitoring of vital signs, hemodynamics,respiratory and cardiac monitoring, extensive review of multiple databases, frequent neurological assessment, discussion with family, other specialists and medical decision making of high complexity.I have made any additions or clarifications directly to the above note.This critical care time does not reflect procedure time, or teaching time or supervisory time of PA/NP/Med Resident etc but could involve care discussion time.  I  spent 30 minutes of neurocritical care time  in the care of  this patient.     Antony Contras, MD Medical Director [facility] Stroke Center Pager: [phone] 05/12/2022 12:11 PM   To contact Stroke Continuity provider, please refer to [email or web address] After hours, contact General Neurology

## 2022-05-12 NOTE — Progress Notes (Signed)
NAME:  [name], MRN:  [id number], DOB:  1934/05/28, LOS: 7 ADMISSION DATE:  05/05/2022 CONSULTATION DATE:  05/05/2022 REFERRING MD:  Leonel Ramsay - Neuro CHIEF COMPLAINT:  Code Stroke, MV post-L MCA occlusion   History of Present Illness:  88 year old woman who presented to [facility] ED 2/6 as Code Stroke. Presented with R-sided weakness/neglect. LKW 0030 2/6. PMHx significant for HTN, emphysema, T2DM, thyroid disease.  Presented to ED as Code Stroke; noted to have R facial droop, R-sided weakness and aphasia. Following some commands with LUE on ED arrival. LKW ~0030 2/6. CT Head negative for ICH. CTA Head/Neck with left MCA M1 occlusion, poor flow in M2 segments, ?low flow, severe vertebrobasilar stenosis, moderate bilateral PCA stenoses. Out of window for TNK. Taken to [facility] for mechanical thrombectomy with TICI3 revascularization. Left intubated post-procedure.   PCCM consulted for medical management  Pertinent Medical History:  DM type 2, Emphysema, HTN, Hypothyroidism  Significant Hospital Events: Including procedures, antibiotic start and stop dates in addition to other pertinent events   2/06 - Presented to ED as Code Stroke. CT Head negative for ICH. CTA Head/Neck with left MCA M1 occlusion, poor flow in M2 segments, ?low flow, severe vertebrobasilar stenosis, moderate bilateral PCA stenoses. Out of window for TNK. Taken to [facility] for mechanical thrombectomy with TICI3 revascularization. Left intubated post-procedure. PCCM consulted for vent management. 2/07- sedation turned off 20/8 - palliative care consulted 2/09 - change to DNR 2/10 - family changed back to full code 2/13 planned family meeting with pallaitive   Interim History / Subjective:  No sedating infusions Remains on Unasyn 0.40, PEEP 5 I/O+ 3.3 L total  Objective:  Blood pressure (!) 143/47, pulse (!) 53, temperature 99.4 F (37.4 C), temperature source Axillary, resp. rate 18, height 5' 2"$  (1.575 m), weight 79.6 kg,  SpO2 100 %.    Vent Mode: PRVC FiO2 (%):  [30 %-40 %] 40 % Set Rate:  [18 bmp] 18 bmp Vt Set:  [400 mL] 400 mL PEEP:  [5 cmH20] 5 cmH20 Pressure Support:  [10 cmH20] 10 cmH20 Plateau Pressure:  [12 cmH20-14 cmH20] 13 cmH20   Intake/Output Summary (Last 24 hours) at 05/12/2022 0755 Last data filed at 05/12/2022 0700 Gross per 24 hour  Intake 1330 ml  Output 1500 ml  Net -170 ml   Filed Weights   05/08/22 0500 05/09/22 0430 05/10/22 0500  Weight: 78.7 kg 78.7 kg 79.6 kg   Physical Examination:  General -elderly woman, intubated, ill-appearing Eyes -flickers eyes to tactile stimulation, not to voice, does not open, pupils equal ENT -ET tube in place, oral secretions present Cardiac -distal, regular, no murmur, 60s Chest-clear bilaterally Abdomen -nondistended, obese, positive bowel sounds Extremities -no edema Skin -onychomycosis, no rash Neuro -continuous movement noted to voice, stimulation this morning  Resolved Hospital Problem List:    Assessment & Plan:   Compromised airway in setting of CVA. - respiratory mechanics favorable to extubation trial - main concern is mental status and long term ability to protect airway - need to clarify goals of care with family prior to extubation trial P: -Appreciate palliative care assistance.  Working to clarify goals for care.  Would recommend either one-way extubation or dedicated transition to comfort care -PEEP and FiO2 at goal  Aspiration pneumonitis/pneumonia. -Plan to complete 7 Days of unasyn, currently day 6/7 on 2/13  Acute left MCA stroke s/p mechanical thrombectomy with TICI 3 -Appreciate neurology management and assistance with family discussions regarding minimal recovery -Antiplatelet regimen as per  neurology  HTN, chronic diastolic CHF, mod AS, HLD. -Goal SBP 180 -Carvedilol, statin as ordered  DM type 2 poorly controlled with hyperglycemia. -Sliding scale insulin per protocol -Semglee as ordered  Best  Practice: (right click and "Reselect all SmartList Selections" daily)   Diet/type: tube feeds DVT prophylaxis: lovenox GI prophylaxis: protonix Lines: none Foley: none Code Status:  FULL code  Last date of multidisciplinary goals of care discussion [appreciate care assistance, next meeting planned 2/13]  Labs:      Latest Ref Rng & Units 05/10/2022    4:11 AM 05/09/2022    2:48 AM 05/08/2022    6:32 AM  CMP  Glucose 70 - 99 mg/dL 194  211  233   BUN 8 - 23 mg/dL 19  16  14   $ Creatinine 0.44 - 1.00 mg/dL 0.57  0.53  0.64   Sodium 135 - 145 mmol/L 136  137  139   Potassium 3.5 - 5.1 mmol/L 4.3  4.0  3.6   Chloride 98 - 111 mmol/L 103  101  103   CO2 22 - 32 mmol/L 23  26  24   $ Calcium 8.9 - 10.3 mg/dL 8.1  8.1  8.0        Latest Ref Rng & Units 05/10/2022    6:01 AM 05/09/2022    2:48 AM 05/08/2022    6:32 AM  CBC  WBC 4.0 - 10.5 K/uL 7.4  7.1  8.1   Hemoglobin 12.0 - 15.0 g/dL 11.9  11.8  12.0   Hematocrit 36.0 - 46.0 % 34.3  35.0  36.2   Platelets 150 - 400 K/uL 141  161  153     ABG    Component Value Date/Time   PHART 7.345 (L) 05/05/2022 1400   PCO2ART 45.7 05/05/2022 1400   PO2ART 100 05/05/2022 1400   HCO3 25.1 05/05/2022 1400   TCO2 26 05/05/2022 1400   ACIDBASEDEF 1.0 05/05/2022 1400   O2SAT 97 05/05/2022 1400    CBG (last 3)  Recent Labs    05/11/22 1923 05/11/22 2303 05/12/22 0323  GLUCAP 224* 230* 172*     This patient is critically ill with multiple organ system failure; which, requires frequent high complexity decision making, assessment, support, evaluation, and titration of therapies. This was completed through the application of advanced monitoring technologies and extensive interpretation of multiple databases. During this encounter critical care time was devoted to patient care services described in this note for 32 minutes.  Baltazar Apo, MD, PhD 05/12/2022, 7:55 AM [facility] [phone] or if no answer before  7:00PM call [phone] For any issues after 7:00PM please call eLink [phone]

## 2022-05-13 DIAGNOSIS — Z7189 Other specified counseling: Secondary | ICD-10-CM | POA: Diagnosis not present

## 2022-05-13 DIAGNOSIS — Z515 Encounter for palliative care: Secondary | ICD-10-CM | POA: Diagnosis not present

## 2022-05-13 DIAGNOSIS — I639 Cerebral infarction, unspecified: Secondary | ICD-10-CM | POA: Diagnosis not present

## 2022-05-13 LAB — CBC
HCT: 34.6 % — ABNORMAL LOW (ref 36.0–46.0)
Hemoglobin: 11.3 g/dL — ABNORMAL LOW (ref 12.0–15.0)
MCH: 30.1 pg (ref 26.0–34.0)
MCHC: 32.7 g/dL (ref 30.0–36.0)
MCV: 92 fL (ref 80.0–100.0)
Platelets: 237 10*3/uL (ref 150–400)
RBC: 3.76 MIL/uL — ABNORMAL LOW (ref 3.87–5.11)
RDW: 11.9 % (ref 11.5–15.5)
WBC: 7.4 10*3/uL (ref 4.0–10.5)
nRBC: 0 % (ref 0.0–0.2)

## 2022-05-13 LAB — GLUCOSE, CAPILLARY
Glucose-Capillary: 206 mg/dL — ABNORMAL HIGH (ref 70–99)
Glucose-Capillary: 168 mg/dL — ABNORMAL HIGH (ref 70–99)
Glucose-Capillary: 159 mg/dL — ABNORMAL HIGH (ref 70–99)
Glucose-Capillary: 159 mg/dL — ABNORMAL HIGH (ref 70–99)
Glucose-Capillary: 172 mg/dL — ABNORMAL HIGH (ref 70–99)
Glucose-Capillary: 167 mg/dL — ABNORMAL HIGH (ref 70–99)

## 2022-05-13 LAB — BASIC METABOLIC PANEL
Anion gap: 9 (ref 5–15)
BUN: 23 mg/dL (ref 8–23)
CO2: 28 mmol/L (ref 22–32)
Calcium: 8.5 mg/dL — ABNORMAL LOW (ref 8.9–10.3)
Chloride: 97 mmol/L — ABNORMAL LOW (ref 98–111)
Creatinine, Ser: 0.6 mg/dL (ref 0.44–1.00)
GFR, Estimated: >60 mL/min (ref >60)
Glucose, Bld: 194 mg/dL — ABNORMAL HIGH (ref 70–99)
Potassium: 4.1 mmol/L (ref 3.5–5.1)
Sodium: 134 mmol/L — ABNORMAL LOW (ref 135–145)

## 2022-05-13 NOTE — Progress Notes (Signed)
NAME:  [name], MRN:  [id number], DOB:  Jun 30, 1934, LOS: 8 ADMISSION DATE:  05/05/2022 CONSULTATION DATE:  05/05/2022 REFERRING MD:  Leonel Ramsay - Neuro CHIEF COMPLAINT:  Code Stroke, MV post-L MCA occlusion   History of Present Illness:  88 year old woman who presented to [facility] ED 2/6 as Code Stroke. Presented with R-sided weakness/neglect. LKW 0030 2/6. PMHx significant for HTN, emphysema, T2DM, thyroid disease.  Presented to ED as Code Stroke; noted to have R facial droop, R-sided weakness and aphasia. Following some commands with LUE on ED arrival. LKW ~0030 2/6. CT Head negative for ICH. CTA Head/Neck with left MCA M1 occlusion, poor flow in M2 segments, ?low flow, severe vertebrobasilar stenosis, moderate bilateral PCA stenoses. Out of window for TNK. Taken to [facility] for mechanical thrombectomy with TICI3 revascularization. Left intubated post-procedure.   PCCM consulted for medical management  Pertinent Medical History:  DM type 2, Emphysema, HTN, Hypothyroidism  Significant Hospital Events: Including procedures, antibiotic start and stop dates in addition to other pertinent events   2/06 - Presented to ED as Code Stroke. CT Head negative for ICH. CTA Head/Neck with left MCA M1 occlusion, poor flow in M2 segments, ?low flow, severe vertebrobasilar stenosis, moderate bilateral PCA stenoses. Out of window for TNK. Taken to [facility] for mechanical thrombectomy with TICI3 revascularization. Left intubated post-procedure. PCCM consulted for vent management. 2/07- sedation turned off 20/8 - palliative care consulted 2/09 - change to DNR 2/10 - family changed back to full code 2/13 planned family meeting with pallaitive   Interim History / Subjective:   Remains critically ill. Intubated on life support   Objective:  Blood pressure (!) 156/59, pulse 62, temperature 98.6 F (37 C), temperature source Axillary, resp. rate (!) 22, height 5' 2"$  (1.575 m), weight 77.8 kg, SpO2 100 %.    Vent  Mode: PRVC FiO2 (%):  [30 %-40 %] 30 % Set Rate:  [18 bmp] 18 bmp Vt Set:  [400 mL] 400 mL PEEP:  [5 cmH20] 5 cmH20 Pressure Support:  [5 cmH20] 5 cmH20 Plateau Pressure:  [12 cmH20-13 cmH20] 13 cmH20   Intake/Output Summary (Last 24 hours) at 05/13/2022 [id number] Last data filed at 05/13/2022 0600 Gross per 24 hour  Intake 1840.07 ml  Output 1200 ml  Net 640.07 ml   Filed Weights   05/09/22 0430 05/10/22 0500 05/13/22 0500  Weight: 78.7 kg 79.6 kg 77.8 kg   Physical Examination:  General - elderly women Eyes - pupils reactive  ENT - ETT in place  Cardiac - RRR, s1 s2  Chest- bilateral breath sounds  Abdomen - NT ND  Extremities - no edema  Skin - no rash  Neuro - sedate, minimal response to pain   Resolved Hospital Problem List:    Assessment & Plan:   Compromised airway in setting of CVA. - respiratory mechanics favorable to extubation trial - main concern is mental status and long term ability to protect airway - need to clarify goals of care with family prior to extubation trial P: Working on [place] discussions with family  Plans for possible one-way extubation   Aspiration pneumonitis/pneumonia. - complete 7 days of abx, today 7/7  Acute left MCA stroke s/p mechanical thrombectomy with TICI 3 -Antiplatelet regimen as per neurology  HTN, chronic diastolic CHF, mod AS, HLD. -Goal SBP 180 -coreg, statin as ordered  DM type 2 poorly controlled with hyperglycemia. - SSI + CBGs   Best Practice: (right click and "Reselect all SmartList Selections" daily)  Diet/type: tube feeds DVT prophylaxis: lovenox GI prophylaxis: protonix Lines: none Foley: none Code Status:  FULL code  Last date of multidisciplinary goals of care discussion [appreciate care assistance, next meeting planned 2/13]  Labs:      Latest Ref Rng & Units 05/13/2022    3:26 AM 05/10/2022    4:11 AM 05/09/2022    2:48 AM  CMP  Glucose 70 - 99 mg/dL 194  194  211   BUN 8 - 23 mg/dL 23  19  16    $ Creatinine 0.44 - 1.00 mg/dL 0.60  0.57  0.53   Sodium 135 - 145 mmol/L 134  136  137   Potassium 3.5 - 5.1 mmol/L 4.1  4.3  4.0   Chloride 98 - 111 mmol/L 97  103  101   CO2 22 - 32 mmol/L 28  23  26   $ Calcium 8.9 - 10.3 mg/dL 8.5  8.1  8.1        Latest Ref Rng & Units 05/13/2022    3:26 AM 05/10/2022    6:01 AM 05/09/2022    2:48 AM  CBC  WBC 4.0 - 10.5 K/uL 7.4  7.4  7.1   Hemoglobin 12.0 - 15.0 g/dL 11.3  11.9  11.8   Hematocrit 36.0 - 46.0 % 34.6  34.3  35.0   Platelets 150 - 400 K/uL 237  141  161     ABG    Component Value Date/Time   PHART 7.345 (L) 05/05/2022 1400   PCO2ART 45.7 05/05/2022 1400   PO2ART 100 05/05/2022 1400   HCO3 25.1 05/05/2022 1400   TCO2 26 05/05/2022 1400   ACIDBASEDEF 1.0 05/05/2022 1400   O2SAT 97 05/05/2022 1400    CBG (last 3)  Recent Labs    05/12/22 1916 05/12/22 2305 05/13/22 0318  GLUCAP 177* 222* 206*     This patient is critically ill with multiple organ system failure; which, requires frequent high complexity decision making, assessment, support, evaluation, and titration of therapies. This was completed through the application of advanced monitoring technologies and extensive interpretation of multiple databases. During this encounter critical care time was devoted to patient care services described in this note for 32 minutes.  Garner Nash, DO [facility] 05/13/2022 7:33 AM

## 2022-05-13 NOTE — Progress Notes (Signed)
Palliative:  HPI: 88 y.o. female  with a h/o dementia, DM, emphysema, HTN, thyroid dz, and prior UTI(s). Admitted d/t inability to move R side. MRI showed that she had suffered a large left MCA territory stroke s/p mechanical thrombectomy of occluded left MCA, and TICI 3 flow was achieved after 1 pass. Now with hemorrhagic transformation. Palliative care has been asked to get involved for discussions as related to goals of care moving forward.   I met again with [name] but no family at bedside. I discussed with RN as well as Dr. Valeta Harms. [name]'s status is unchanged.   I called and spoke with daughter, [name]. [name] is just leaving the hospital from visit. I asked her what she thought about her mother's status and she tells me that she seems the same. [name] shares that her mother seemed fearful and scared when they were trying to provide care and she attempted to open her eyes when [name] was talking to her. [name] and I further discussed family dynamics and goals of care. [name] shares that 2 children Occupational psychologist) live with [name] and another son has not been involved in her life for the past 9-10 years. We reviewed decision making is majority of adult children in the absence of HCPOA and spouse. I expressed my concern that I am getting conflicting answers for how to move forward which is concerning to me. I want to make sure that family are all aware of the decision made to move forward. I suggested to [name] that we have another meeting on Friday and she agrees this would be reasonable. I told [name] that all 5 children need to participate in person or via telephone if they wish to have a vote in path forward for their mother as a decision cannot be made and then other family come the next day concerned about the decisions made - they need to participate in conversation together with [place]. [name] confirms that she will share with her 4 other siblings the plan for conversation Friday 1100 am in person or  through the conference call line that we have already shared with them.   [name] shares that family do have differing opinions on what to do for [name]. She explains that some want to give her more time but [name] shares that she knows that this will not bring the improvement they desire. [name] had to make the decision to transition her husband to comfort care a month ago so she has more understanding of the barriers her mother is facing. When I explained the importance of having improved neurological status to protect airway and maintain off ventilator support successfully she understands because her husband was too weak to cough and clear his lungs. She is not prepared to have to make these decisions again but understands they have to do what is best for her mother. I expressed my condolences that [name] has to face these difficult decisions again so soon.   All questions/concerns addressed. Emotional support provided.   Exam: Unresponsive. Does not follow commands. Resists L > R eye when I try to life eyelid. She does not look at me. Weaning on vent. No distress. Moves towards noxious stimuli on left side but no clear purposeful movements.  Slight withdraw to painful stimuli on R side.    Plan: - Family goals of care not aligned. - Repeat family meeting 05/15/22 Friday 0737 am  7 min  Vinie Sill, NP Palliative Medicine Team Pager [phone] (Please  see [email or web address] for schedule) Team Phone [phone]    Greater than 50%  of this time was spent counseling and coordinating care related to the above assessment and plan

## 2022-05-13 NOTE — Progress Notes (Signed)
STROKE TEAM PROGRESS NOTE   INTERVAL HISTORY Patient is seen in her room with no family at the bedside.  Vital signs have been stable overnight, and neurological exam remains remains unchanged with patient aphasic with left gaze deviation and right hemiplegia.  Family meeting yesterday held with palliative care failed to establish clear goals of care as for family members could not agree and she remains a full code at present.  Plan palliative care team plans to meet again with them in a few days.. Vitals:   05/13/22 0600 05/13/22 0700 05/13/22 0746 05/13/22 0800  BP: (!) 145/55 (!) 156/59  (!) 159/81  Pulse: 65 62 73 75  Resp:   17   Temp:    98.1 F (36.7 C)  TempSrc:    Oral  SpO2: 99% 100% 100% 100%  Weight:      Height:       CBC:  Recent Labs  Lab 05/10/22 0601 05/13/22 0326  WBC 7.4 7.4  HGB 11.9* 11.3*  HCT 34.3* 34.6*  MCV 89.8 92.0  PLT 141* [id number]   Basic Metabolic Panel:  Recent Labs  Lab 05/07/22 1810 05/08/22 0632 05/09/22 0248 05/10/22 0411 05/13/22 0326  NA  --  139   < > 136 134*  K  --  3.6   < > 4.3 4.1  CL  --  103   < > 103 97*  CO2  --  24   < > 23 28  GLUCOSE  --  233*   < > 194* 194*  BUN  --  14   < > 19 23  CREATININE  --  0.64   < > 0.57 0.60  CALCIUM  --  8.0*   < > 8.1* 8.5*  MG 2.1 2.2  --   --   --   PHOS 2.0* 2.6  --   --   --    < > = values in this interval not displayed.   Lipid Panel:  No results for input(s): "CHOL", "TRIG", "HDL", "CHOLHDL", "VLDL", "LDLCALC" in the last 168 hours. HgbA1c:  No results for input(s): "HGBA1C" in the last 168 hours.  Urine Drug Screen:  No results for input(s): "LABOPIA", "COCAINSCRNUR", "LABBENZ", "AMPHETMU", "THCU", "LABBARB" in the last 168 hours.  Alcohol Level  No results for input(s): "ETH" in the last 168 hours.   IMAGING past 24 hours No results found.  PHYSICAL EXAM   Temp:  [98.1 F (36.7 C)-100.1 F (37.8 C)] 98.1 F (36.7 C) (02/14 0800) Pulse Rate:  [53-79] 75 (02/14  0800) Resp:  [17-25] 17 (02/14 0746) BP: (115-169)/(36-101) 159/81 (02/14 0800) SpO2:  [99 %-100 %] 100 % (02/14 0800) FiO2 (%):  [30 %-40 %] 30 % (02/14 0800) Weight:  [77.8 kg] 77.8 kg (02/14 0500)  General - Well nourished, well developed, intubated off sedation.  Ophthalmologic - fundi not visualized due to noncooperation.  Cardiovascular - Regular rate and rhythm.  Neuro (intubated without sedation)-patient rests with eyes closed, left gaze deviation, does not follow commands, globally aphasic.  Pupils equal round and reactive to light.  Gag and cough present.  Patient will localize noxious stimuli with left upper extremity and moves left upper and lower extremities purposefully, no movement of right upper extremity, withdraws right lower extremity to noxious stimulation    ASSESSMENT/PLAN [name] is a 88 y.o. female with history of diabetes, emphysema and hypertension presenting with inability to move her right side.  TNK was not given  as she presented outside the window.  She was taken to interventional radiology for mechanical thrombectomy of occluded left MCA, and TICI 3 flow was achieved after 1 pass.  MRI shows left MCA territory infarct with some hemorrhagic transformation.  Family meeting 2/9, was made DNR, now is full code again. She has been off sedation since 2/7.  Family meeting to be held again today with palliative care.  Stroke:  large left MCA stroke with mild HT due to left M1 occlusion s/p IR with TICI3, etiology possibly large vessel stenosis, however, cardioembolic source can not be excluded. CT head acute infarct in the left MCA territory ASPECTS 5-6 CTA head & neck acute occlusion of left M1 segment with poor opacifications of M2 segments, severe stenosis of vertebrobasilar junction and proximal basilar artery, multifocal mild to moderate stenoses in bilateral PCA territory, severe stenosis at left V3 V4 junction MRI left MCA territory infarct with  hemorrhagic transformation CT repeat 2/11 stable large left MCA infarct with confluent petechial hemorrhage in the BG, stable 42m rightward MLS. 2D Echo EF 70 to 75%  LDL 112 HgbA1c 9.6 VTE prophylaxis - Lovenox   No antithrombotic prior to admission, now on ASA 325 Therapy recommendations: Pending Disposition: Pending, goals of care conversations with family ongoing. Per family, pt would like full code. Family requested more time to assess her condition before committed to extubation and plan if not tolerating extubation.   Respiratory failure ? Aspiration penumonia Patient left intubated after procedure Ventilator management per CCM On weaning now Extubate when able Tmax of 101.4-> 102.2 -> 100.4->afebrile On Unasyn  UA negative No leukocytosis  Hypertension Home meds: Lisinopril 20 mg daily Stable On coreg 12.5, lisnopril 20 Long-term BP goal normotensive  Hyperlipidemia Home meds: None LDL 112, goal < 70 rosuvastatin 20 mg daily Continue statin at discharge  Diabetes type II Uncontrolled Home meds: Glipizide 10 mg twice daily HgbA1c 9.6, goal < 7.0 CBGs SSI Recommend close PCP follow-up for better diabetes control  Dysphagia  Intubated now On TF @ 67 Other Stroke Risk Factors Advanced Age >/= 662 Obesity, Body mass index is 31.37 kg/m., BMI >/= 30 associated with increased stroke risk, recommend weight loss, diet and exercise as appropriate   Other Active Problems Emphysema  Hospital day # 8  Patient remains aphasic with right hemiplegia with poor neurological prognosis and unlikely to survive without prolonged ventilatory support, tracheostomy, feeding tube and nursing home care.  Family meeting with palliative care team hopefully later today to make decision on goals of care.  Discussed with Dr.Icard critical care medicine. This patient is critically ill and at significant risk of neurological worsening, death and care requires constant monitoring of  vital signs, hemodynamics,respiratory and cardiac monitoring, extensive review of multiple databases, frequent neurological assessment, discussion with family, other specialists and medical decision making of high complexity.I have made any additions or clarifications directly to the above note.This critical care time does not reflect procedure time, or teaching time or supervisory time of PA/NP/Med Resident etc but could involve care discussion time.  I spent 30 minutesof neurocritical care time  in the care of  this patient.     PAntony Contras MD Medical Director [facility] Pager: [phone]/14/2024 10:55 AM   To contact Stroke Continuity provider, please refer to [email or web address] After hours, contact General Neurology

## 2022-05-13 NOTE — Progress Notes (Signed)
Nutrition Follow-up  DOCUMENTATION CODES:   Not applicable  INTERVENTION:  - Continue Jevity 1.2 @ goal rate of 60 ml/hr (1440 ml/day) - PROSource TF20 60 ml daily   Tube feeding regimen at goal rate provides 1808 kcal, 100 grams of protein, and 1162 ml of H2O.   - Continue MVI with minerals daily per tube   NUTRITION DIAGNOSIS:   Inadequate oral intake related to inability to eat as evidenced by NPO status.  GOAL:   Patient will meet greater than or equal to 90% of their needs  MONITOR:   Vent status, Labs, Weight trends, TF tolerance, Skin  REASON FOR ASSESSMENT:   Ventilator, Consult Enteral/tube feeding initiation and management  ASSESSMENT:   88 year old female who presented to the ED on 2/06 as a Code Stroke. PMH of T2DM, HTN, emphysema. Pt admitted with left MCA stroke.  Meds reviewed: sliding scale insulin, Semglee (18 units), miralax, senokot. Labs reviewed: Na low.   The pt was receiving TF via Cortrak as ordered at time of assessment. RN reports that the pt is tolerating well with no issues. Palliative care is following along with the pt. Pt remains full code. RD will continue to monitor TF tolerance and POC.   Diet Order:   Diet Order             Diet NPO time specified  Diet effective now                   EDUCATION NEEDS:   Not appropriate for education at this time  Skin:  Skin Assessment: Skin Integrity Issues: Skin Integrity Issues:: Stage II Stage II: L buttocks  Last BM:  05/12/22  Height:   Ht Readings from Last 1 Encounters:  05/05/22 5' 2"$  (1.575 m)    Weight:   Wt Readings from Last 1 Encounters:  05/13/22 77.8 kg    Ideal Body Weight:  50 kg  BMI:  Body mass index is 31.37 kg/m.  Estimated Nutritional Needs:   Kcal:  1700-1900  Protein:  85-100 grams  Fluid:  1.7-1.9 L  Thalia Bloodgood, RD, LDN, CNSC.

## 2022-05-14 DIAGNOSIS — I639 Cerebral infarction, unspecified: Secondary | ICD-10-CM | POA: Diagnosis not present

## 2022-05-14 LAB — BASIC METABOLIC PANEL
Anion gap: 8 (ref 5–15)
BUN: 22 mg/dL (ref 8–23)
CO2: 30 mmol/L (ref 22–32)
Calcium: 8.6 mg/dL — ABNORMAL LOW (ref 8.9–10.3)
Chloride: 100 mmol/L (ref 98–111)
Creatinine, Ser: 0.58 mg/dL (ref 0.44–1.00)
GFR, Estimated: >60 mL/min (ref >60)
Glucose, Bld: 132 mg/dL — ABNORMAL HIGH (ref 70–99)
Potassium: 3.9 mmol/L (ref 3.5–5.1)
Sodium: 138 mmol/L (ref 135–145)

## 2022-05-14 LAB — CBC
HCT: 34.6 % — ABNORMAL LOW (ref 36.0–46.0)
Hemoglobin: 11.7 g/dL — ABNORMAL LOW (ref 12.0–15.0)
MCH: 31 pg (ref 26.0–34.0)
MCHC: 33.8 g/dL (ref 30.0–36.0)
MCV: 91.8 fL (ref 80.0–100.0)
Platelets: 273 10*3/uL (ref 150–400)
RBC: 3.77 MIL/uL — ABNORMAL LOW (ref 3.87–5.11)
RDW: 11.9 % (ref 11.5–15.5)
WBC: 7.4 10*3/uL (ref 4.0–10.5)
nRBC: 0 % (ref 0.0–0.2)

## 2022-05-14 LAB — GLUCOSE, CAPILLARY
Glucose-Capillary: 144 mg/dL — ABNORMAL HIGH (ref 70–99)
Glucose-Capillary: 139 mg/dL — ABNORMAL HIGH (ref 70–99)
Glucose-Capillary: 159 mg/dL — ABNORMAL HIGH (ref 70–99)
Glucose-Capillary: 137 mg/dL — ABNORMAL HIGH (ref 70–99)
Glucose-Capillary: 186 mg/dL — ABNORMAL HIGH (ref 70–99)
Glucose-Capillary: 159 mg/dL — ABNORMAL HIGH (ref 70–99)

## 2022-05-14 NOTE — Progress Notes (Signed)
NAME:  [name], MRN:  [id number], DOB:  03-14-1935, LOS: 9 ADMISSION DATE:  05/05/2022 CONSULTATION DATE:  05/05/2022 REFERRING MD:  Leonel Ramsay - Neuro CHIEF COMPLAINT:  Code Stroke, MV post-L MCA occlusion   History of Present Illness:  88 year old woman who presented to [facility] ED 2/6 as Code Stroke. Presented with R-sided weakness/neglect. LKW 0030 2/6. PMHx significant for HTN, emphysema, T2DM, thyroid disease.  Presented to ED as Code Stroke; noted to have R facial droop, R-sided weakness and aphasia. Following some commands with LUE on ED arrival. LKW ~0030 2/6. CT Head negative for ICH. CTA Head/Neck with left MCA M1 occlusion, poor flow in M2 segments, ?low flow, severe vertebrobasilar stenosis, moderate bilateral PCA stenoses. Out of window for TNK. Taken to [facility] for mechanical thrombectomy with TICI3 revascularization. Left intubated post-procedure.   PCCM consulted for medical management  Pertinent Medical History:  DM type 2, Emphysema, HTN, Hypothyroidism  Significant Hospital Events: Including procedures, antibiotic start and stop dates in addition to other pertinent events   2/06 - Presented to ED as Code Stroke. CT Head negative for ICH. CTA Head/Neck with left MCA M1 occlusion, poor flow in M2 segments, ?low flow, severe vertebrobasilar stenosis, moderate bilateral PCA stenoses. Out of window for TNK. Taken to [facility] for mechanical thrombectomy with TICI3 revascularization. Left intubated post-procedure. PCCM consulted for vent management. 2/07- sedation turned off 20/8 - palliative care consulted 2/09 - change to DNR 2/10 - family changed back to full code 2/13 planned family meeting with pallaitive   Interim History / Subjective:   Remains critically ill. Intubated on life support   Objective:  Blood pressure (!) 142/51, pulse 65, temperature 99.5 F (37.5 C), temperature source Axillary, resp. rate (!) 22, height 5' 2"$  (1.575 m), weight 77.8 kg, SpO2 99 %.    Vent  Mode: PRVC FiO2 (%):  [30 %] 30 % Set Rate:  [18 bmp] 18 bmp Vt Set:  [400 mL] 400 mL PEEP:  [5 cmH20] 5 cmH20 Pressure Support:  [5 cmH20] 5 cmH20 Plateau Pressure:  [11 cmH20-13 cmH20] 11 cmH20   Intake/Output Summary (Last 24 hours) at 05/14/2022 0818 Last data filed at 05/14/2022 0700 Gross per 24 hour  Intake 1210.21 ml  Output 1100 ml  Net 110.21 ml   Filed Weights   05/09/22 0430 05/10/22 0500 05/13/22 0500  Weight: 78.7 kg 79.6 kg 77.8 kg   Physical Examination:  General - elderly FM, critically ill, intubated  Eyes - pupils reactive, does not track ENT - ETT in place  Cardiac - RRR, s1 s2  Chest- BL vented breaths  Abdomen - NT ND  Extremities - no edema  Skin - no rash  Neuro - sedated  Resolved Hospital Problem List:    Assessment & Plan:   Compromised airway in setting of CVA. - respiratory mechanics favorable to extubation trial - main concern is mental status and long term ability to protect airway - need to clarify goals of care with family prior to extubation trial P: Plans for another family meeting on Friday at 11am We appreciate palliative care help with this   Aspiration pneumonitis/pneumonia. - completed 7 days of abx   Acute left MCA stroke s/p mechanical thrombectomy with TICI 3 - continue antiplatelets per neurology  HTN, chronic diastolic CHF, mod AS, HLD. -Goal SBP 180 - coreg and statin   DM type 2 poorly controlled with hyperglycemia. - SSI + cbgs    Best Practice: (right click and "Reselect all  SmartList Selections" daily)   Diet/type: tube feeds DVT prophylaxis: lovenox GI prophylaxis: protonix Lines: none Foley: none Code Status:  FULL code  Last date of multidisciplinary goals of care discussion [repeat family meeting on Friday with palliative care team]  Labs:      Latest Ref Rng & Units 05/13/2022    3:26 AM 05/10/2022    4:11 AM 05/09/2022    2:48 AM  CMP  Glucose 70 - 99 mg/dL 194  194  211   BUN 8 - 23 mg/dL  23  19  16   $ Creatinine 0.44 - 1.00 mg/dL 0.60  0.57  0.53   Sodium 135 - 145 mmol/L 134  136  137   Potassium 3.5 - 5.1 mmol/L 4.1  4.3  4.0   Chloride 98 - 111 mmol/L 97  103  101   CO2 22 - 32 mmol/L 28  23  26   $ Calcium 8.9 - 10.3 mg/dL 8.5  8.1  8.1        Latest Ref Rng & Units 05/14/2022    6:04 AM 05/13/2022    3:26 AM 05/10/2022    6:01 AM  CBC  WBC 4.0 - 10.5 K/uL 7.4  7.4  7.4   Hemoglobin 12.0 - 15.0 g/dL 11.7  11.3  11.9   Hematocrit 36.0 - 46.0 % 34.6  34.6  34.3   Platelets 150 - 400 K/uL 273  237  141     ABG    Component Value Date/Time   PHART 7.345 (L) 05/05/2022 1400   PCO2ART 45.7 05/05/2022 1400   PO2ART 100 05/05/2022 1400   HCO3 25.1 05/05/2022 1400   TCO2 26 05/05/2022 1400   ACIDBASEDEF 1.0 05/05/2022 1400   O2SAT 97 05/05/2022 1400    CBG (last 3)  Recent Labs    05/13/22 1917 05/13/22 2312 05/14/22 0321  GLUCAP 172* 167* 144*     This patient is critically ill with multiple organ system failure; which, requires frequent high complexity decision making, assessment, support, evaluation, and titration of therapies. This was completed through the application of advanced monitoring technologies and extensive interpretation of multiple databases. During this encounter critical care time was devoted to patient care services described in this note for 31 minutes.  Garner Nash, DO [facility] 05/14/2022 8:18 AM

## 2022-05-14 NOTE — Progress Notes (Signed)
STROKE TEAM PROGRESS NOTE   INTERVAL HISTORY Patient remains intubated for respiratory failure.  Vital signs have been stable overnight, and neurological exam remains remains unchanged with patient aphasic with left gaze deviation and right hemiplegia.   Plan palliative care team plans to meet again with family in a few days..  Vital signs stable Vitals:   05/14/22 1000 05/14/22 1053 05/14/22 1100 05/14/22 1200  BP: (!) 145/52 (!) 145/52 (!) 151/50 (!) 107/40  Pulse: 74 68 65 (!) 59  Resp:  (!) 25    Temp:      TempSrc:      SpO2: 100% 100% 100% 99%  Weight:      Height:       CBC:  Recent Labs  Lab 05/13/22 0326 05/14/22 0604  WBC 7.4 7.4  HGB 11.3* 11.7*  HCT 34.6* 34.6*  MCV 92.0 91.8  PLT 237 [id number]   Basic Metabolic Panel:  Recent Labs  Lab 05/07/22 1810 05/08/22 0632 05/09/22 0248 05/13/22 0326 05/14/22 0604  NA  --  139   < > 134* 138  K  --  3.6   < > 4.1 3.9  CL  --  103   < > 97* 100  CO2  --  24   < > 28 30  GLUCOSE  --  233*   < > 194* 132*  BUN  --  14   < > 23 22  CREATININE  --  0.64   < > 0.60 0.58  CALCIUM  --  8.0*   < > 8.5* 8.6*  MG 2.1 2.2  --   --   --   PHOS 2.0* 2.6  --   --   --    < > = values in this interval not displayed.   Lipid Panel:  No results for input(s): "CHOL", "TRIG", "HDL", "CHOLHDL", "VLDL", "LDLCALC" in the last 168 hours. HgbA1c:  No results for input(s): "HGBA1C" in the last 168 hours.  Urine Drug Screen:  No results for input(s): "LABOPIA", "COCAINSCRNUR", "LABBENZ", "AMPHETMU", "THCU", "LABBARB" in the last 168 hours.  Alcohol Level  No results for input(s): "ETH" in the last 168 hours.   IMAGING past 24 hours No results found.  PHYSICAL EXAM   Temp:  [98.1 F (36.7 C)-99.6 F (37.6 C)] 98.6 F (37 C) (02/15 0800) Pulse Rate:  [53-81] 59 (02/15 1200) Resp:  [22-28] 25 (02/15 1053) BP: (98-158)/(37-63) 107/40 (02/15 1200) SpO2:  [99 %-100 %] 99 % (02/15 1200) FiO2 (%):  [30 %] 30 % (02/15 1053)  General  - Well nourished, well developed, intubated off sedation.  Ophthalmologic - fundi not visualized due to noncooperation.  Cardiovascular - Regular rate and rhythm.  Neuro (intubated without sedation)-patient rests with eyes closed, left gaze deviation, does not follow commands, globally aphasic.  Pupils equal round and reactive to light.  Gag and cough present.  Patient will localize noxious stimuli with left upper extremity and moves left upper and lower extremities purposefully, no movement of right upper extremity, withdraws right lower extremity to noxious stimulation    ASSESSMENT/PLAN Ms. Cheris Lammert is a 88 y.o. female with history of diabetes, emphysema and hypertension presenting with inability to move her right side.  TNK was not given as she presented outside the window.  She was taken to interventional radiology for mechanical thrombectomy of occluded left MCA, and TICI 3 flow was achieved after 1 pass.  MRI shows left MCA territory infarct with some hemorrhagic transformation.  Family meeting 2/9, was made DNR, now is full code again. She has been off sedation since 2/7.  Family meeting to be held again today with palliative care.  Stroke:  large left MCA stroke with mild HT due to left M1 occlusion s/p IR with TICI3, etiology possibly large vessel stenosis, however, cardioembolic source can not be excluded. CT head acute infarct in the left MCA territory ASPECTS 5-6 CTA head & neck acute occlusion of left M1 segment with poor opacifications of M2 segments, severe stenosis of vertebrobasilar junction and proximal basilar artery, multifocal mild to moderate stenoses in bilateral PCA territory, severe stenosis at left V3 V4 junction MRI left MCA territory infarct with hemorrhagic transformation CT repeat 2/11 stable large left MCA infarct with confluent petechial hemorrhage in the BG, stable 72m rightward MLS. 2D Echo EF 70 to 75%  LDL 112 HgbA1c 9.6 VTE prophylaxis - Lovenox    No antithrombotic prior to admission, now on ASA 325 Therapy recommendations: Pending Disposition: Pending, goals of care conversations with family ongoing. Per family, pt would like full code. Family requested more time to assess her condition before committed to extubation and plan if not tolerating extubation.   Respiratory failure ? Aspiration penumonia Patient left intubated after procedure Ventilator management per CCM On weaning now Extubate when able Tmax of 101.4-> 102.2 -> 100.4->afebrile On Unasyn  UA negative No leukocytosis  Hypertension Home meds: Lisinopril 20 mg daily Stable On coreg 12.5, lisnopril 20 Long-term BP goal normotensive  Hyperlipidemia Home meds: None LDL 112, goal < 70 rosuvastatin 20 mg daily Continue statin at discharge  Diabetes type II Uncontrolled Home meds: Glipizide 10 mg twice daily HgbA1c 9.6, goal < 7.0 CBGs SSI Recommend close PCP follow-up for better diabetes control  Dysphagia  Intubated now On TF @ 611 Other Stroke Risk Factors Advanced Age >/= 6100 Obesity, Body mass index is 31.37 kg/m., BMI >/= 30 associated with increased stroke risk, recommend weight loss, diet and exercise as appropriate   Other Active Problems Emphysema  Hospital day # 9  Patient remains aphasic with right hemiplegia with poor neurological prognosis and unlikely to survive without prolonged ventilatory support, tracheostomy, feeding tube and nursing home care.  Family meeting with palliative care team again in a few more days to make decision on goals of care.  Discussed with Dr.Icard critical care medicine.   This patient is critically ill and at significant risk of neurological worsening, death and care requires constant monitoring of vital signs, hemodynamics,respiratory and cardiac monitoring, extensive review of multiple databases, frequent neurological assessment, discussion with family, other specialists and medical decision making of high  complexity.I have made any additions or clarifications directly to the above note.This critical care time does not reflect procedure time, or teaching time or supervisory time of PA/NP/Med Resident etc but could involve care discussion time.  I spent 30 minutes of neurocritical care time  in the care of  this patient.         PAntony Contras MD Medical Director [facility] Pager: [phone]/15/2024 12:57 PM   To contact Stroke Continuity provider, please refer to [email or web address] After hours, contact General Neurology

## 2022-05-15 ENCOUNTER — Inpatient Hospital Stay (HOSPITAL_COMMUNITY): Payer: Medicare HMO

## 2022-05-15 DIAGNOSIS — Z515 Encounter for palliative care: Secondary | ICD-10-CM | POA: Diagnosis not present

## 2022-05-15 DIAGNOSIS — Z7189 Other specified counseling: Secondary | ICD-10-CM | POA: Diagnosis not present

## 2022-05-15 DIAGNOSIS — I639 Cerebral infarction, unspecified: Secondary | ICD-10-CM | POA: Diagnosis not present

## 2022-05-15 DIAGNOSIS — J9601 Acute respiratory failure with hypoxia: Secondary | ICD-10-CM | POA: Diagnosis not present

## 2022-05-15 DIAGNOSIS — I5032 Chronic diastolic (congestive) heart failure: Secondary | ICD-10-CM | POA: Diagnosis not present

## 2022-05-15 LAB — URINALYSIS, ROUTINE W REFLEX MICROSCOPIC
Bilirubin Urine: NEGATIVE
Glucose, UA: NEGATIVE mg/dL
Hgb urine dipstick: NEGATIVE
Ketones, ur: NEGATIVE mg/dL
Leukocytes,Ua: NEGATIVE
Nitrite: NEGATIVE
Protein, ur: NEGATIVE mg/dL
Specific Gravity, Urine: 1.017 (ref 1.005–1.030)
pH: 7 (ref 5.0–8.0)

## 2022-05-15 LAB — GLUCOSE, CAPILLARY
Glucose-Capillary: 105 mg/dL — ABNORMAL HIGH (ref 70–99)
Glucose-Capillary: 150 mg/dL — ABNORMAL HIGH (ref 70–99)
Glucose-Capillary: 164 mg/dL — ABNORMAL HIGH (ref 70–99)
Glucose-Capillary: 152 mg/dL — ABNORMAL HIGH (ref 70–99)
Glucose-Capillary: 169 mg/dL — ABNORMAL HIGH (ref 70–99)
Glucose-Capillary: 273 mg/dL — ABNORMAL HIGH (ref 70–99)

## 2022-05-15 LAB — CBC
HCT: 31 % — ABNORMAL LOW (ref 36.0–46.0)
Hemoglobin: 10.5 g/dL — ABNORMAL LOW (ref 12.0–15.0)
MCH: 31.2 pg (ref 26.0–34.0)
MCHC: 33.9 g/dL (ref 30.0–36.0)
MCV: 92 fL (ref 80.0–100.0)
Platelets: 276 10*3/uL (ref 150–400)
RBC: 3.37 MIL/uL — ABNORMAL LOW (ref 3.87–5.11)
RDW: 11.9 % (ref 11.5–15.5)
WBC: 7 10*3/uL (ref 4.0–10.5)
nRBC: 0 % (ref 0.0–0.2)

## 2022-05-15 LAB — BASIC METABOLIC PANEL
Anion gap: 10 (ref 5–15)
BUN: 23 mg/dL (ref 8–23)
CO2: 32 mmol/L (ref 22–32)
Calcium: 8.5 mg/dL — ABNORMAL LOW (ref 8.9–10.3)
Chloride: 99 mmol/L (ref 98–111)
Creatinine, Ser: 0.64 mg/dL (ref 0.44–1.00)
GFR, Estimated: >60 mL/min (ref >60)
Glucose, Bld: 91 mg/dL (ref 70–99)
Potassium: 4.2 mmol/L (ref 3.5–5.1)
Sodium: 141 mmol/L (ref 135–145)

## 2022-05-15 NOTE — Progress Notes (Signed)
NAME:  [name], MRN:  [id number], DOB:  1934-11-17, LOS: 10 ADMISSION DATE:  05/05/2022 CONSULTATION DATE:  05/05/2022 REFERRING MD:  Leonel Ramsay - Neuro CHIEF COMPLAINT:  Code Stroke, MV post-L MCA occlusion   History of Present Illness:  88 year old woman who presented to [facility] ED 2/6 as Code Stroke. Presented with R-sided weakness/neglect. LKW 0030 2/6. PMHx significant for HTN, emphysema, T2DM, thyroid disease.  Presented to ED as Code Stroke; noted to have R facial droop, R-sided weakness and aphasia. Following some commands with LUE on ED arrival. LKW ~0030 2/6. CT Head negative for ICH. CTA Head/Neck with left MCA M1 occlusion, poor flow in M2 segments, ?low flow, severe vertebrobasilar stenosis, moderate bilateral PCA stenoses. Out of window for TNK. Taken to [facility] for mechanical thrombectomy with TICI3 revascularization. Left intubated post-procedure.   PCCM consulted for medical management  Pertinent Medical History:  DM type 2, Emphysema, HTN, Hypothyroidism  Significant Hospital Events: Including procedures, antibiotic start and stop dates in addition to other pertinent events   2/06 - Presented to ED as Code Stroke. CT Head negative for ICH. CTA Head/Neck with left MCA M1 occlusion, poor flow in M2 segments, ?low flow, severe vertebrobasilar stenosis, moderate bilateral PCA stenoses. Out of window for TNK. Taken to [facility] for mechanical thrombectomy with TICI3 revascularization. Left intubated post-procedure. PCCM consulted for vent management. 2/07- sedation turned off 20/8 - palliative care consulted 2/09 - change to DNR 2/10 - family changed back to full code 2/13 planned family meeting with pallaitive   Interim History / Subjective:   Patient spiked fever with Tmax 101, Though white count remained stable Tolerating spontaneous breathing trial  Objective:  Blood pressure (!) 163/57, pulse 64, temperature 97.9 F (36.6 C), temperature source Axillary, resp. rate (!) 22,  height 5' 2"$  (1.575 m), weight 79 kg, SpO2 99 %.    Vent Mode: PSV;CPAP FiO2 (%):  [30 %] 30 % Set Rate:  [18 bmp] 18 bmp Vt Set:  [400 mL] 400 mL PEEP:  [5 cmH20] 5 cmH20 Pressure Support:  [8 cmH20] 8 cmH20 Plateau Pressure:  [11 cmH20] 11 cmH20   Intake/Output Summary (Last 24 hours) at 05/15/2022 0741 Last data filed at 05/15/2022 0600 Gross per 24 hour  Intake 1410 ml  Output 700 ml  Net 710 ml   Filed Weights   05/10/22 0500 05/13/22 0500 05/15/22 0410  Weight: 79.6 kg 77.8 kg 79 kg   Physical Examination:   Physical exam: General: Elderly female, orally intubated HEENT: [place]/AT, eyes anicteric.  ETT and OGT in place Neuro: Eyes closed, does not open, tries to shut off, not following commands, purposeful and antigravity on left side, flicker movement on right side Chest: Coarse breath sounds, no wheezes or rhonchi Heart: Regular rate and rhythm, no murmurs or gallops Abdomen: Soft, nondistended, bowel sounds present Skin: No rash  Labs and images were reviewed  Resolved Hospital Problem List:    Assessment & Plan:  Acute hypoxic respiratory failure Continue lung protective ventilation Patient is tolerating spontaneous breathing trial but mental status precludes extubation VAP prevention bundle in place Awaiting goals of care discussion Family meeting is scheduled for today at 11 AM  Aspiration pneumonia. Spiked fever overnight with Tmax 101 The white count remained stable Completed antibiotic treatment for 7 days with Unasyn  Acute left MCA stroke s/p mechanical thrombectomy with TICI 3 Continue aspirin and Crestor Stroke team is following  HTN Chronic HFpEF Moderate aortic stenosis Hyperlipidemia Blood pressure is better controlled  now Continue Coreg and lisinopril Monitor intake and output Outpatient follow-up with cardiology Continue Crestor  DM type 2 poorly controlled with hyperglycemia. Blood sugars are better controlled now Continue sliding  scale and long-acting insulin with CBG goal 140-180  Best Practice: (right click and "Reselect all SmartList Selections" daily)   Diet/type: tube feeds DVT prophylaxis: lovenox GI prophylaxis: protonix Lines: none Foley: none Code Status:  FULL code  Last date of multidisciplinary goals of care discussion [Family meeting is scheduled for today with palliative care team]  Labs:      Latest Ref Rng & Units 05/15/2022    4:03 AM 05/14/2022    6:04 AM 05/13/2022    3:26 AM  CMP  Glucose 70 - 99 mg/dL 91  132  194   BUN 8 - 23 mg/dL 23  22  23   $ Creatinine 0.44 - 1.00 mg/dL 0.64  0.58  0.60   Sodium 135 - 145 mmol/L 141  138  134   Potassium 3.5 - 5.1 mmol/L 4.2  3.9  4.1   Chloride 98 - 111 mmol/L 99  100  97   CO2 22 - 32 mmol/L 32  30  28   Calcium 8.9 - 10.3 mg/dL 8.5  8.6  8.5        Latest Ref Rng & Units 05/15/2022    4:03 AM 05/14/2022    6:04 AM 05/13/2022    3:26 AM  CBC  WBC 4.0 - 10.5 K/uL 7.0  7.4  7.4   Hemoglobin 12.0 - 15.0 g/dL 10.5  11.7  11.3   Hematocrit 36.0 - 46.0 % 31.0  34.6  34.6   Platelets 150 - 400 K/uL 276  273  237     ABG    Component Value Date/Time   PHART 7.345 (L) 05/05/2022 1400   PCO2ART 45.7 05/05/2022 1400   PO2ART 100 05/05/2022 1400   HCO3 25.1 05/05/2022 1400   TCO2 26 05/05/2022 1400   ACIDBASEDEF 1.0 05/05/2022 1400   O2SAT 97 05/05/2022 1400    CBG (last 3)  Recent Labs    05/14/22 2305 05/15/22 0316 05/15/22 0729  GLUCAP 159* 105* 150*     This patient is critically ill with multiple organ system failure which requires frequent high complexity decision making, assessment, support, evaluation, and titration of therapies. This was completed through the application of advanced monitoring technologies and extensive interpretation of multiple databases.  During this encounter critical care time was devoted to patient care services described in this note for 32 minutes.    Jacky Kindle, MD [facility] See Amion for pager If no response to pager, please call [phone] until 7pm After 7pm, Please call E-link [phone]

## 2022-05-15 NOTE — Progress Notes (Signed)
STROKE TEAM PROGRESS NOTE   INTERVAL HISTORY No family members at the bedside during a.m. rounds .Patient remains intubated for respiratory failure.  Vital signs have been stable overnight, and neurological exam remains remains unchanged with patient aphasic with left gaze deviation and right hemiplegia.   Plan palliative care team plans to meet again with family today to finalize goals of care..  Vital signs stable Vitals:   05/15/22 1000 05/15/22 1100 05/15/22 1139 05/15/22 1200  BP: (!) 147/55 (!) 120/51  (!) 120/48  Pulse: 63 (!) 58 (!) 59 62  Resp:   (!) 21   Temp:      TempSrc:      SpO2: 98% 99% 100% 100%  Weight:      Height:       CBC:  Recent Labs  Lab 05/14/22 0604 05/15/22 0403  WBC 7.4 7.0  HGB 11.7* 10.5*  HCT 34.6* 31.0*  MCV 91.8 92.0  PLT 273 [id number]   Basic Metabolic Panel:  Recent Labs  Lab 05/14/22 0604 05/15/22 0403  NA 138 141  K 3.9 4.2  CL 100 99  CO2 30 32  GLUCOSE 132* 91  BUN 22 23  CREATININE 0.58 0.64  CALCIUM 8.6* 8.5*   Lipid Panel:  No results for input(s): "CHOL", "TRIG", "HDL", "CHOLHDL", "VLDL", "LDLCALC" in the last 168 hours. HgbA1c:  No results for input(s): "HGBA1C" in the last 168 hours.  Urine Drug Screen:  No results for input(s): "LABOPIA", "COCAINSCRNUR", "LABBENZ", "AMPHETMU", "THCU", "LABBARB" in the last 168 hours.  Alcohol Level  No results for input(s): "ETH" in the last 168 hours.   IMAGING past 24 hours No results found.  PHYSICAL EXAM   Temp:  [97.9 F (36.6 C)-101 F (38.3 C)] 99 F (37.2 C) (02/16 0800) Pulse Rate:  [53-67] 62 (02/16 1200) Resp:  [21-29] 21 (02/16 1139) BP: (93-163)/(34-87) 120/48 (02/16 1200) SpO2:  [98 %-100 %] 100 % (02/16 1200) FiO2 (%):  [30 %] 30 % (02/16 1139) Weight:  [79 kg] 79 kg (02/16 0410)  General - Well nourished, well developed, intubated off sedation.  Ophthalmologic - fundi not visualized due to noncooperation.  Cardiovascular - Regular rate and rhythm.  Neuro  (intubated without sedation)-patient rests with eyes closed, left gaze deviation, does not follow commands, globally aphasic.  Pupils equal round and reactive to light.  Gag and cough present.  Patient will localize noxious stimuli with left upper extremity and moves left upper and lower extremities purposefully, no movement of right upper extremity, withdraws right lower extremity to noxious stimulation    ASSESSMENT/PLAN [name] is a 88 y.o. female with history of diabetes, emphysema and hypertension presenting with inability to move her right side.  TNK was not given as she presented outside the window.  She was taken to interventional radiology for mechanical thrombectomy of occluded left MCA, and TICI 3 flow was achieved after 1 pass.  MRI shows left MCA territory infarct with some hemorrhagic transformation.  Family meeting 2/9, was made DNR, now is full code again. She has been off sedation since 2/7.  Family meeting to be held again today with palliative care.  Stroke:  large left MCA stroke with mild HT due to left M1 occlusion s/p IR with TICI3, etiology possibly large vessel stenosis, however, cardioembolic source can not be excluded. CT head acute infarct in the left MCA territory ASPECTS 5-6 CTA head & neck acute occlusion of left M1 segment with poor opacifications of M2 segments,  severe stenosis of vertebrobasilar junction and proximal basilar artery, multifocal mild to moderate stenoses in bilateral PCA territory, severe stenosis at left V3 V4 junction MRI left MCA territory infarct with hemorrhagic transformation CT repeat 2/11 stable large left MCA infarct with confluent petechial hemorrhage in the BG, stable 44m rightward MLS. 2D Echo EF 70 to 75%  LDL 112 HgbA1c 9.6 VTE prophylaxis - Lovenox   No antithrombotic prior to admission, now on ASA 325 Therapy recommendations: Pending Disposition: Pending, goals of care conversations with family ongoing. Per family, pt  would like full code. Family requested more time to assess her condition before committed to extubation and plan if not tolerating extubation.   Respiratory failure ? Aspiration penumonia Patient left intubated after procedure Ventilator management per CCM On weaning now Extubate when able Tmax of 101.4-> 102.2 -> 100.4->afebrile On Unasyn  UA negative No leukocytosis  Hypertension Home meds: Lisinopril 20 mg daily Stable On coreg 12.5, lisnopril 20 Long-term BP goal normotensive  Hyperlipidemia Home meds: None LDL 112, goal < 70 rosuvastatin 20 mg daily Continue statin at discharge  Diabetes type II Uncontrolled Home meds: Glipizide 10 mg twice daily HgbA1c 9.6, goal < 7.0 CBGs SSI Recommend close PCP follow-up for better diabetes control  Dysphagia  Intubated now On TF @ 662 Other Stroke Risk Factors Advanced Age >/= 653 Obesity, Body mass index is 31.85 kg/m., BMI >/= 30 associated with increased stroke risk, recommend weight loss, diet and exercise as appropriate   Other Active Problems Emphysema  Hospital day # 10  Patient remains aphasic with right hemiplegia with poor neurological prognosis and unlikely to survive without prolonged ventilatory support, tracheostomy, feeding tube and nursing home care.  Family meeting with palliative care team again today  to make decision on goals of care.  Discussed with DrChand critical care medicine.   This patient is critically ill and at significant risk of neurological worsening, death and care requires constant monitoring of vital signs, hemodynamics,respiratory and cardiac monitoring, extensive review of multiple databases, frequent neurological assessment, discussion with family, other specialists and medical decision making of high complexity.I have made any additions or clarifications directly to the above note.This critical care time does not reflect procedure time, or teaching time or supervisory time of PA/NP/Med  Resident etc but could involve care discussion time.  I spent 30 minutes of neurocritical care time  in the care of  this patient.         PAntony Contras MD Medical Director [facility] Pager: [phone]/16/2024 2:06 PM   To contact Stroke Continuity provider, please refer to [email or web address] After hours, contact General Neurology

## 2022-05-15 NOTE — Progress Notes (Signed)
Palliative Medicine Inpatient Follow Up Note HPI: [name] is a 88 y.o. female  with a h/o dementia, DM, emphysema, HTN, thyroid dz, and prior UTI(s). Admitted d/t inability to move R side. MRI showed that she had suffered a large left MCA territory stroke s/p mechanical thrombectomy of occluded left MCA, and TICI 3 flow was achieved after 1 pass. Now with hemorrhagic transformation.    Palliative care has been asked to get involved for discussions as related to goals of care moving forward.   Today's Discussion 05/15/2022  *Please note that this is a verbal dictation therefore any spelling or grammatical errors are due to the "[place] One" system interpretation.  Chart reviewed inclusive of vital signs, progress notes, laboratory results, and diagnostic images.   Theodoro Grist, APP and I met with patients son, [name], granddaughter, [name], we were joined on speaker-phone by patients daughters, [name] and Teacher, music as well as her granddaughter, [name].   We reviewed the severity of patients illness(s) inclusive of her large MCA stroke. We reviewed that an injury like this in an elderly patient can typically be very traumatic. Discussed the possible long term complications inclusive of hemiplegia, dependence on others for total care, and the likelihood of infection such as pneumonia, urinary infection(s), sacral ulcers, generalized deconditioning, and overall poor health.   Reviewed with patients family that they do not feel she would want to live a life dependent on others for care. I shared that many of [place] would not desire that life so it is within reason that [name] would not want this. Decisions with 3/5 children were made to pursue extubation tomorrow. There will be no re-intubation and if patients declines thereafter she will be made comfort care.   Family does not feel that patient would desire a life with a tracheostomy or PEG.   I did discuss with not desiring a  PEG that it is likely we will be having an end of life conversations sooner than later. I was honest in communicating that we would have to see but I worry that [name] will not be able to participate safely in a swallow evaluation. If that is the case the only option thereafter would be PEG placement or hospice care.   I described hospice as a service for patients who have limited life expectancy. The goal of hospice is the preservation of dignity and quality at the end phases of life. Under hospice care, the focus changes from curative to symptom relief. Patients children vocalize understanding of this as sadly two of them went through this process with another loved one recently.   Questions and concerns addressed/Palliative Support Provided.   Objective Assessment: Vital Signs Vitals:   05/15/22 0800 05/15/22 1139  BP: (!) 157/73   Pulse: 64 (!) 59  Resp:  (!) 21  Temp: 99 F (37.2 C)   SpO2: 100% 100%    Intake/Output Summary (Last 24 hours) at 05/15/2022 1151 Last data filed at 05/15/2022 0800 Gross per 24 hour  Intake 1290 ml  Output 700 ml  Net 590 ml    Last Weight  Most recent update: 05/15/2022  4:10 AM    Weight  79 kg (174 lb 2.6 oz)            Gen:  Elderly caucasian F acutely ill in appearence HEENT: ETT, cortrak, dry mucous membranes CV: Regular rate and rhythm  PULM: On mechanical ventilator ABD: soft/nontender, (+) BS EXT: No edema  Neuro: Somnolent  SUMMARY OF RECOMMENDATIONS   DNAR  Plan for extubation tomorrow with no plans for re-intubation  If patient does not thrive or decompensates --> place on comfort measures  Ongoing PMT support  Total Time: 17 Billing based on MDM: High ______________________________________________________________________ [place] Team Team Cell Phone: [phone] Please utilize secure chat with additional questions, if there is no response within 30 minutes please call the  above phone number  Palliative Medicine Team providers are available by phone from 7am to 7pm daily and can be reached through the team cell phone.  Should this patient require assistance outside of these hours, please call the patient's attending physician.

## 2022-05-16 DIAGNOSIS — I639 Cerebral infarction, unspecified: Secondary | ICD-10-CM | POA: Diagnosis not present

## 2022-05-16 DIAGNOSIS — Z515 Encounter for palliative care: Secondary | ICD-10-CM | POA: Diagnosis not present

## 2022-05-16 DIAGNOSIS — Z7189 Other specified counseling: Secondary | ICD-10-CM | POA: Diagnosis not present

## 2022-05-16 LAB — CBC
HCT: 33.2 % — ABNORMAL LOW (ref 36.0–46.0)
Hemoglobin: 11.3 g/dL — ABNORMAL LOW (ref 12.0–15.0)
MCH: 30.9 pg (ref 26.0–34.0)
MCHC: 34 g/dL (ref 30.0–36.0)
MCV: 90.7 fL (ref 80.0–100.0)
Platelets: 279 10*3/uL (ref 150–400)
RBC: 3.66 MIL/uL — ABNORMAL LOW (ref 3.87–5.11)
RDW: 11.9 % (ref 11.5–15.5)
WBC: 7.6 10*3/uL (ref 4.0–10.5)
nRBC: 0 % (ref 0.0–0.2)

## 2022-05-16 LAB — BASIC METABOLIC PANEL
Anion gap: 10 (ref 5–15)
BUN: 21 mg/dL (ref 8–23)
CO2: 28 mmol/L (ref 22–32)
Calcium: 8.5 mg/dL — ABNORMAL LOW (ref 8.9–10.3)
Chloride: 96 mmol/L — ABNORMAL LOW (ref 98–111)
Creatinine, Ser: 0.57 mg/dL (ref 0.44–1.00)
GFR, Estimated: >60 mL/min (ref >60)
Glucose, Bld: 206 mg/dL — ABNORMAL HIGH (ref 70–99)
Potassium: 4.2 mmol/L (ref 3.5–5.1)
Sodium: 134 mmol/L — ABNORMAL LOW (ref 135–145)

## 2022-05-16 LAB — GLUCOSE, CAPILLARY
Glucose-Capillary: 203 mg/dL — ABNORMAL HIGH (ref 70–99)
Glucose-Capillary: 145 mg/dL — ABNORMAL HIGH (ref 70–99)
Glucose-Capillary: 197 mg/dL — ABNORMAL HIGH (ref 70–99)

## 2022-05-16 MED ORDER — MORPHINE 100MG IN NS 100ML (1MG/ML) PREMIX INFUSION
1.0000 mg/h | INTRAVENOUS | Status: DC
  Administered 2022-05-16: 1 mg/h via INTRAVENOUS

## 2022-05-16 MED ORDER — MORPHINE 100MG IN NS 100ML (1MG/ML) PREMIX INFUSION
INTRAVENOUS | Status: AC
  Filled 2022-05-16: qty 100

## 2022-05-16 MED ORDER — MORPHINE SULFATE (PF) 2 MG/ML IV SOLN: 2.0000 mg | INTRAVENOUS | Status: DC | PRN

## 2022-05-16 MED ORDER — ONDANSETRON HCL 4 MG/2ML IJ SOLN: 4.0000 mg | Freq: Four times a day (QID) | INTRAMUSCULAR | Status: DC | PRN

## 2022-05-16 MED ORDER — ONDANSETRON 4 MG PO TBDP: 4.0000 mg | ORAL_TABLET | Freq: Four times a day (QID) | ORAL | Status: DC | PRN

## 2022-05-16 MED ORDER — MIDAZOLAM HCL 2 MG/2ML IJ SOLN: 2.0000 mg | INTRAMUSCULAR | Status: DC | PRN

## 2022-05-16 MED ORDER — MORPHINE BOLUS VIA INFUSION: 1.0000 mg | INTRAVENOUS | Status: DC | PRN

## 2022-05-16 MED ORDER — POLYVINYL ALCOHOL 1.4 % OP SOLN: 1.0000 [drp] | Freq: Four times a day (QID) | OPHTHALMIC | Status: DC | PRN

## 2022-05-16 MED ORDER — GLYCOPYRROLATE 0.2 MG/ML IJ SOLN
0.4000 mg | INTRAMUSCULAR | Status: DC
  Administered 2022-05-16 – 2022-05-17 (×7): 0.4 mg via INTRAVENOUS
  Filled 2022-05-16 (×7): qty 2

## 2022-05-16 MED ORDER — BIOTENE DRY MOUTH MT LIQD: 15.0000 mL | OROMUCOSAL | Status: DC | PRN

## 2022-05-16 MED ORDER — BISACODYL 10 MG RE SUPP: 10.0000 mg | Freq: Every day | RECTAL | Status: DC | PRN

## 2022-05-16 NOTE — Progress Notes (Addendum)
STROKE TEAM PROGRESS NOTE   INTERVAL HISTORY Patient was seen in her room with no family members at the bedside.  Vital signs have been stable, and neurological exam remains stable.  Plans had been made for extubation at 11:00, and upon discussion with palliative care, family opted for compassionate extubation with transition to comfort care per patient's previously stated wishes. Vitals:   05/16/22 0900 05/16/22 1000 05/16/22 1100 05/16/22 1209  BP: (!) 133/41  (!) 110/32   Pulse: (!) 57 (!) 56 (!) 57 65  Resp:      Temp:   98.8 F (37.1 C)   TempSrc:   Oral   SpO2: 100% 100% 100% 97%  Weight:      Height:       CBC:  Recent Labs  Lab 05/15/22 0403 05/16/22 0245  WBC 7.0 7.6  HGB 10.5* 11.3*  HCT 31.0* 33.2*  MCV 92.0 90.7  PLT 276 [id number]    Basic Metabolic Panel:  Recent Labs  Lab 05/15/22 0403 05/16/22 0245  NA 141 134*  K 4.2 4.2  CL 99 96*  CO2 32 28  GLUCOSE 91 206*  BUN 23 21  CREATININE 0.64 0.57  CALCIUM 8.5* 8.5*    Lipid Panel:  No results for input(s): "CHOL", "TRIG", "HDL", "CHOLHDL", "VLDL", "LDLCALC" in the last 168 hours. HgbA1c:  No results for input(s): "HGBA1C" in the last 168 hours.  Urine Drug Screen:  No results for input(s): "LABOPIA", "COCAINSCRNUR", "LABBENZ", "AMPHETMU", "THCU", "LABBARB" in the last 168 hours.  Alcohol Level  No results for input(s): "ETH" in the last 168 hours.   IMAGING past 24 hours DG CHEST PORT 1 VIEW  Result Date: 05/15/2022 CLINICAL DATA:  Fever. EXAM: PORTABLE CHEST 1 VIEW COMPARISON:  May 06, 2022. FINDINGS: Stable cardiomediastinal silhouette. Feeding tube is seen entering stomach. Mild bibasilar subsegmental atelectasis is noted with possible small left pleural effusion. Bony thorax is unremarkable. IMPRESSION: Mild bibasilar subsegmental atelectasis with possible small left pleural effusion. Electronically Signed   By: Marijo Conception M.D.   On: 05/15/2022 15:30    PHYSICAL EXAM   Temp:  [98.7 F  (37.1 C)-100.2 F (37.9 C)] 98.8 F (37.1 C) (02/17 1100) Pulse Rate:  [55-77] 65 (02/17 1209) Resp:  [22-24] 22 (02/17 0310) BP: (105-153)/(32-61) 110/32 (02/17 1100) SpO2:  [97 %-100 %] 97 % (02/17 1209) FiO2 (%):  [30 %] 30 % (02/17 0800) Weight:  [77.8 kg] 77.8 kg (02/17 0500)  General - Well nourished, well developed, intubated off sedation.  Ophthalmologic - fundi not visualized due to noncooperation.  Cardiovascular - Regular rate and rhythm.  Neuro (intubated without sedation)-patient rests with eyes closed, left gaze deviation, does not follow commands, globally aphasic.  Pupils equal round and reactive to light.  Gag and cough present.  Patient will localize noxious stimuli with left upper extremity and moves left upper and lower extremities purposefully, no movement of right upper extremity, withdraws right lower extremity to noxious stimulation    ASSESSMENT/PLAN [name] is a 88 y.o. female with history of diabetes, emphysema and hypertension presenting with inability to move her right side.  TNK was not given as she presented outside the window.  She was taken to interventional radiology for mechanical thrombectomy of occluded left MCA, and TICI 3 flow was achieved after 1 pass.  MRI shows left MCA territory infarct with some hemorrhagic transformation.  Family meeting 2/9, was made DNR, now is full code again. She has been off  sedation since 2/7.  After discussion with palliative care today, family opted for compassionate extubation with transition to comfort care.  Stroke:  large left MCA stroke with mild HT due to left M1 occlusion s/p IR with TICI3, etiology possibly large vessel stenosis, however, cardioembolic source can not be excluded. CT head acute infarct in the left MCA territory ASPECTS 5-6 CTA head & neck acute occlusion of left M1 segment with poor opacifications of M2 segments, severe stenosis of vertebrobasilar junction and proximal basilar artery,  multifocal mild to moderate stenoses in bilateral PCA territory, severe stenosis at left V3 V4 junction MRI left MCA territory infarct with hemorrhagic transformation CT repeat 2/11 stable large left MCA infarct with confluent petechial hemorrhage in the BG, stable 30m rightward MLS. 2D Echo EF 70 to 75%  LDL 112 HgbA1c 9.6 VTE prophylaxis - was on Lovenox   No antithrombotic prior to admission, was on ASA 325. Now in comfort care Disposition: family requested Comfort care today  Respiratory failure ? Aspiration penumonia Patient left intubated after procedure Ventilator management per CCM Was on weaning Tmax of 101.4-> 102.2 -> 100.4->afebrile Was on Unasyn  UA negative No leukocytosis One way extubation today for comfort  Hypertension Home meds: Lisinopril 20 mg daily Stable Was on coreg 12.5, lisnopril 20  Hyperlipidemia Home meds: None LDL 112, goal < 70  Diabetes type II Uncontrolled Home meds: Glipizide 10 mg twice daily HgbA1c 9.6   Dysphagia  Off TF  for comfort  Other Stroke Risk Factors Advanced Age >/= 630 Obesity, Body mass index is 31.37 kg/m., BMI >/= 30 associated with increased stroke risk, recommend weight loss, diet and exercise as appropriate   Other Active Problems Emphysema  Hospital day # 11  Patient seen by NP and then by MD, MD to edit note is needed [place], MSN, AGACNP-BC [facility] See Amion for schedule and pager information 05/16/2022 2:08 PM   ATTENDING NOTE: I reviewed above note and agree with the assessment and plan. Pt was seen and examined.   No family at bedside.  Patient still intubated, opens eyes on voice, still has right hemiplegia, left withdraw on pain stimulation.  Plan for one way extubation today with comfort care measures per family request.  Palliative care on board, appreciate assistance.  For detailed assessment and plan, please refer to above/below as I have made changes wherever  appropriate.   JRosalin Hawking MD PhD Stroke Neurology 05/16/2022 8:44 PM    To contact Stroke Continuity provider, please refer to [email or web address] After hours, contact General Neurology

## 2022-05-16 NOTE — Procedures (Signed)
Extubation Procedure Note  Patient Details:   Name: [name] DOB: 1934-11-30 MRN: [id number]   Airway Documentation:   + cuff leak test prior to extubation.   Vent end date: 05/16/22 Vent end time: 1208   Evaluation  O2 sats: currently acceptable Complications: No apparent complications Patient did tolerate procedure well. Bilateral Breath Sounds: Clear, Diminished   No pt not able to speak, no distress noted.  Pt was extubated per family wishes and per extubation order w/ palliative present.    Lenna Sciara 05/16/2022, 12:13 PM

## 2022-05-16 NOTE — Progress Notes (Signed)
NAME:  [name], MRN:  [id number], DOB:  1934/04/19, LOS: 11 ADMISSION DATE:  05/05/2022 CONSULTATION DATE:  05/05/2022 REFERRING MD:  Leonel Ramsay - Neuro CHIEF COMPLAINT:  Code Stroke, MV post-L MCA occlusion   History of Present Illness:  88 year old woman who presented to [facility] ED 2/6 as Code Stroke. Presented with R-sided weakness/neglect. LKW 0030 2/6. PMHx significant for HTN, emphysema, T2DM, thyroid disease.  Presented to ED as Code Stroke; noted to have R facial droop, R-sided weakness and aphasia. Following some commands with LUE on ED arrival. LKW ~0030 2/6. CT Head negative for ICH. CTA Head/Neck with left MCA M1 occlusion, poor flow in M2 segments, ?low flow, severe vertebrobasilar stenosis, moderate bilateral PCA stenoses. Out of window for TNK. Taken to [facility] for mechanical thrombectomy with TICI3 revascularization. Left intubated post-procedure.   PCCM consulted for medical management  Pertinent Medical History:  DM type 2, Emphysema, HTN, Hypothyroidism  Significant Hospital Events: Including procedures, antibiotic start and stop dates in addition to other pertinent events   2/06 - Presented to ED as Code Stroke. CT Head negative for ICH. CTA Head/Neck with left MCA M1 occlusion, poor flow in M2 segments, ?low flow, severe vertebrobasilar stenosis, moderate bilateral PCA stenoses. Out of window for TNK. Taken to [facility] for mechanical thrombectomy with TICI3 revascularization. Left intubated post-procedure. PCCM consulted for vent management. 2/07- sedation turned off 20/8 - palliative care consulted 2/09 - change to DNR 2/10 - family changed back to full code 2/13 planned family meeting with pallaitive  2/16 palliative meeting with plan for one-way extubation tomorrow  Interim History / Subjective:   Stable on the ventilator.  Palliative care meeting was held yesterday with plan for one-way extubation  Objective:  Blood pressure (!) 128/41, pulse 60, temperature 99.7 F  (37.6 C), temperature source Axillary, resp. rate (!) 22, height 5' 2"$  (1.575 m), weight 77.8 kg, SpO2 100 %.    Vent Mode: PRVC FiO2 (%):  [30 %] 30 % Set Rate:  [18 bmp] 18 bmp Vt Set:  [400 mL] 400 mL PEEP:  [5 cmH20] 5 cmH20 Pressure Support:  [8 cmH20] 8 cmH20 Plateau Pressure:  [13 cmH20-15 cmH20] 15 cmH20   Intake/Output Summary (Last 24 hours) at 05/16/2022 0729 Last data filed at 05/16/2022 0530 Gross per 24 hour  Intake 720 ml  Output 1575 ml  Net -855 ml   Filed Weights   05/13/22 0500 05/15/22 0410 05/16/22 0500  Weight: 77.8 kg 79 kg 77.8 kg   Physical Examination: Gen:      No acute distress HEENT:  EOMI, sclera anicteric Neck:     No masses; no thyromegaly, ET tube Lungs:    Clear to auscultation bilaterally; normal respiratory effort CV:         Regular rate and rhythm; no murmurs Abd:      + bowel sounds; soft, non-tender; no palpable masses, no distension Ext:    No edema; adequate peripheral perfusion Skin:      Warm and dry; no rash Neuro: Sedated, unresponsive  Labs and images were reviewed Significant for sodium 134, calcium 8.5 Hemoglobin 11.3 No new imaging  Resolved Hospital Problem List:    Assessment & Plan:  Acute hypoxic respiratory failure Continue vent support.  Mental status is a barrier to extubation Follow intermittent chest x-ray  Aspiration pneumonia. Finished 7 days of antibiotic therapy with Unasyn  Acute left MCA stroke s/p mechanical thrombectomy with TICI 3 Continue aspirin and Crestor Stroke team is following  HTN Chronic HFpEF Moderate aortic stenosis Hyperlipidemia Continue Coreg, lisinopril  DM type 2 poorly controlled with hyperglycemia. SSI coverage  Goals of care Plan for one-way extubation today  Best Practice: (right click and "Reselect all SmartList Selections" daily)   Diet/type: tube feeds DVT prophylaxis: lovenox GI prophylaxis: protonix Lines: none Foley: none Code Status:  FULL code  Last  date of multidisciplinary goals of care discussion [Family meeting with palliative care team 2/16]  Signature:   The patient is critically ill with multiple organ system failure and requires high complexity decision making for assessment and support, frequent evaluation and titration of therapies, advanced monitoring, review of radiographic studies and interpretation of complex data.   Critical Care Time devoted to patient care services, exclusive of separately billable procedures, described in this note is 35 minutes.   Marshell Garfinkel MD [facility] See Amion for pager  If no response to pager , please call [phone] until 7pm After 7:00 pm call Elink  [phone] 05/16/2022, 7:29 AM

## 2022-05-16 NOTE — Progress Notes (Signed)
Daily Progress Note   Patient Name: [name]       Date: 05/16/2022 DOB: Aug 09, 1934  Age: 88 y.o. MRN#: [id number] Attending Physician: Stroke, Md, MD Primary Care Physician: Tsosie Billing, MD (Inactive) Admit Date: 05/05/2022  Reason for Consultation/Follow-up: Establishing goals of care  HPI/Brief Hospital Review: [name] is a 88 y.o. female  with a h/o dementia, DM, emphysema, HTN, thyroid dz, and prior UTI(s). Admitted d/t inability to move R side. MRI showed that she had suffered a large left MCA territory stroke s/p mechanical thrombectomy of occluded left MCA, and TICI 3 flow was achieved after 1 pass. Now with hemorrhagic transformation.   Palliative Medicine consulted for assisting with goals of care conversations.  Subjective: Extensive chart review has been completed prior to meeting patient including labs, vital signs, imaging, progress notes, orders, and available advanced directive documents from current and previous encounters.    Visited with [name] at her bedside. Multiple family members present. Explained process of liberating [name] from mechanical ventilation. Family with appropriate questions which were addressed. Encouraged family to consider transitioning to comfort care at time of liberalizing from ventilator. Relieving [name] of any discomfort, pain or suffering likely to be contributed with transition. Explained medical team with understanding in the event [name] survived liberalization from ventilation her QOL likely to be poor-likely requiring trach/PEG and long term nursing facility placement. Family members agree this would not be the wishes of [name].  After discussions with family, they desire full comfort measures at this  time. Appropriate comfort order set placed.  Provided space and opportunity for family to ask questions and talk through their grief.  Family requests chaplain presence during time of transition-paged.  PMT to continue to follow for ongoing support.  Objective:       Vital Signs: BP (!) 110/32   Pulse (!) 57   Temp 98.8 F (37.1 C) (Oral)   Resp (!) 22   Ht 5' 2"$  (1.575 m)   Wt 77.8 kg   SpO2 100%   BMI 31.37 kg/m  SpO2: SpO2: 100 % O2 Device: O2 Device: Ventilator O2 Flow Rate:     Palliative Care Assessment & Plan   Assessment/Recommendation/Plan  Transition to full comfort measures-continuous IV morphine infusion for comfort, IV midazolam as needed  for anxiety/agitation Discontinue all medical interventions not focused on comfort PMT to continue to follow for ongoing support and need   Care plan was discussed with nursing staff and Dr. Erlinda Hong.  Thank you for allowing the Palliative Medicine Team to assist in the care of this patient.  Greater than 50%  of this time was spent counseling and coordinating care related to the above assessment and plan.  Theodoro Grist, DNP, AGNP-C Palliative Medicine   Please contact Palliative Medicine Team phone at [phone] for questions and concerns.

## 2022-05-16 NOTE — Progress Notes (Signed)
   05/16/22 1300  Spiritual Encounters  Type of Visit Initial  Care provided to: Pt and family  Referral source Nurse (RN/NT/LPN);Family  Reason for visit End-of-life  OnCall Visit No  Spiritual Framework  Values/beliefs [place] xstrong  Community/Connection Family;Faith community  Strengths  (Faith)  Patient Stress Factors None identified  Family Stress Factors Loss;Loss of control;Major life changes  Interventions  Spiritual Care Interventions Made Established relationship of care and support;Compassionate presence;Normalization of emotions;Reflective listening;Explored ethical dilemma;Narrative/life review;Explored values/beliefs/practices/strengths  Intervention Outcomes  Outcomes Connection to spiritual care;Reduced isolation;Awareness of support;Connection to values and goals of care    Met with family and patient at bedside - provided spiritual care and comfort at bedside

## 2022-05-17 DIAGNOSIS — Z515 Encounter for palliative care: Secondary | ICD-10-CM

## 2022-05-17 DIAGNOSIS — Z7189 Other specified counseling: Secondary | ICD-10-CM | POA: Diagnosis not present

## 2022-05-17 DIAGNOSIS — I639 Cerebral infarction, unspecified: Secondary | ICD-10-CM | POA: Diagnosis not present

## 2022-05-17 MED ORDER — MORPHINE BOLUS VIA INFUSION: 1.0000 mg | INTRAVENOUS | 0 refills | Status: AC | PRN

## 2022-05-17 NOTE — Progress Notes (Addendum)
Patient received from [place] to the room 07 via bed at 1245Pm with Morphine drip. Patient is with 2L [place]. And got the D/C order after little while. They have arranged hospice.

## 2022-05-17 NOTE — Progress Notes (Signed)
PCCM Progress Note   No further critical care needs identified. PCCM will sign off. Thank you for the opportunity to participate in this patient's care. Please contact if we can be of further assistance.  Lynisha Osuch D. Harris, NP-C [facility] Personal contact information can be found on Amion  If no contact or response made please call [phone] 05/17/2022, 9:43 AM

## 2022-05-17 NOTE — Progress Notes (Signed)
   Palliative Medicine Inpatient Follow Up Note HPI: [name] is a 88 y.o. female  with a h/o dementia, DM, emphysema, HTN, thyroid dz, and prior UTI(s). Admitted d/t inability to move R side. MRI showed that she had suffered a large left MCA territory stroke s/p mechanical thrombectomy of occluded left MCA, and TICI 3 flow was achieved after 1 pass. Now with hemorrhagic transformation.    Palliative care has been asked to get involved for discussions as related to goals of care moving forward.   Today's Discussion 05/17/2022  *Please note that this is a verbal dictation therefore any spelling or grammatical errors are due to the "[place] One" system interpretation.  Chart reviewed inclusive of vital signs, progress notes, laboratory results, and diagnostic images.   I met with patients daughter, [name] at bedside this morning. We discussed that patient has been comfortable throughout the course of the evening on 26m/hr of IV morphine. I shared that LTanniais in a position whereby we can consider transferring her to a facility - I clarified this would be an inpatient hospice home where the care we are providing here would then be continued. [name] shares that [name] I asked is we can go ahead and send a referral which patients daughter is in agreement with.  Questions and concerns addressed/Palliative Support Provided.   Objective Assessment: Vital Signs Vitals:   05/17/22 0600 05/17/22 0630  BP:    Pulse: 67 64  Resp:    Temp:    SpO2: 99% 100%    Intake/Output Summary (Last 24 hours) at 05/17/2022 1047 Last data filed at 05/17/2022 0800 Gross per 24 hour  Intake 141.11 ml  Output 500 ml  Net -358.89 ml    Last Weight  Most recent update: 05/16/2022  6:58 AM    Weight  77.8 kg (171 lb 8.3 oz)            Gen:  Elderly caucasian F acutely ill in  appearence HEENT: dry mucous membranes CV: Regular rate and rhythm  PULM:  2LPM [place] ABD: soft/nontender, (+) BS EXT: No edema  Neuro: Somnolent  SUMMARY OF RECOMMENDATIONS   DNAR/DNI  Continue comfort measures  Continue on morphine gtt with bolus and titration as needed  Appreciate TOC consult for Hospice Home Placement  Ongoing PMT support  Billing based on MDM: High --> Continue ongoing  ______________________________________________________________________ MMax MeadowsPalliative Medicine Team Team Cell Phone: [phone] utilize secure chat with additional questions, if there is no response within 30 minutes please call the above phone number  Palliative Medicine Team providers are available by phone from 7am to 7pm daily and can be reached through the team cell phone.  Should this patient require assistance outside of these hours, please call the patient's attending physician.

## 2022-05-17 NOTE — Progress Notes (Addendum)
Per Palliative Care APP, pt's dtr is agreeable to hospice home placement and prefers [place] location. Referral made to Sarah with [facility]. TOC will follow and assist as indicated.    UPDATE: spoke to pt's dtr Freda who reports she and family have determined that [place] is too far and they prefer the hospice home in [place]. Referral made to [facility] with [place] who will eval pt and f/u with family. SW will provide updates as available.   Wandra Feinstein, MSW, LCSW [phone] (coverage)

## 2022-05-17 NOTE — Discharge Summary (Addendum)
Stroke Discharge Summary  Patient ID: [name]   MRN: [id number]      DOB: 12/15/1934  Date of Admission: 05/05/2022 Date of Discharge: 05/17/2022  Attending Physician:  Stroke, Md, MD, Stroke MD Consultant(s):    pulmonary/intensive care and palliative care   Patient's PCP:  Tsosie Billing, MD (Inactive)  DISCHARGE DIAGNOSIS:  large left MCA stroke with mild HT due to left M1 occlusion s/p IR with TICI3, etiology possibly large vessel stenosis, however, cardioembolic source can not be excluded   Active Problems: Respiratory failure HTN HLD DM, uncontrolled Dysphagia  obesity   Allergies as of 05/17/2022       Reactions   Aspirin Other (See Comments)   Stomach cramp        Medication List     STOP taking these medications    Accu-Chek Aviva Plus test strip Generic drug: glucose blood   blood glucose meter kit and supplies   glipiZIDE 10 MG tablet Commonly known as: GLUCOTROL   lisinopril 20 MG tablet Commonly known as: ZESTRIL       TAKE these medications    morphine 5 mg/mL Soln Inject 1 mg into the vein as needed (SHOB, increased WOB).        LABORATORY STUDIES CBC    Component Value Date/Time   WBC 7.6 05/16/2022 0245   RBC 3.66 (L) 05/16/2022 0245   HGB 11.3 (L) 05/16/2022 0245   HGB 13.5 10/27/2019 0952   HCT 33.2 (L) 05/16/2022 0245   HCT 38.6 10/27/2019 0952   PLT 279 05/16/2022 0245   PLT 199 10/27/2019 0952   MCV 90.7 05/16/2022 0245   MCV 90 10/27/2019 0952   MCH 30.9 05/16/2022 0245   MCHC 34.0 05/16/2022 0245   RDW 11.9 05/16/2022 0245   RDW 12.6 10/27/2019 0952   LYMPHSABS 1.7 05/06/2022 0505   MONOABS 0.8 05/06/2022 0505   EOSABS 0.0 05/06/2022 0505   BASOSABS 0.0 05/06/2022 0505   CMP    Component Value Date/Time   NA 134 (L) 05/16/2022 0245   NA 140 10/27/2019 0952   K 4.2 05/16/2022 0245   CL 96 (L) 05/16/2022 0245   CO2 28 05/16/2022 0245   GLUCOSE 206 (H) 05/16/2022 0245   BUN 21  05/16/2022 0245   BUN 9 10/27/2019 0952   CREATININE 0.57 05/16/2022 0245   CALCIUM 8.5 (L) 05/16/2022 0245   PROT 6.8 05/05/2022 1152   PROT 7.3 10/27/2019 0952   ALBUMIN 3.4 (L) 05/05/2022 1152   ALBUMIN 4.5 10/27/2019 0952   AST 17 05/05/2022 1152   ALT 15 05/05/2022 1152   ALKPHOS 57 05/05/2022 1152   BILITOT 0.2 (L) 05/05/2022 1152   BILITOT 0.4 10/27/2019 0952   GFRNONAA >60 05/16/2022 0245   GFRAA 93 10/27/2019 0952   COAGS Lab Results  Component Value Date   INR 1.1 05/05/2022   INR 1.86 (H) 10/31/2009   INR 1.97 (H) 10/30/2009   Lipid Panel    Component Value Date/Time   CHOL 176 05/06/2022 0505   CHOL 198 10/27/2019 0952   TRIG 172 (H) 05/06/2022 0505   TRIG 177 (H) 05/06/2022 0505   HDL 30 (L) 05/06/2022 0505   HDL 38 (L) 10/27/2019 0952   CHOLHDL 5.9 05/06/2022 0505   VLDL 34 05/06/2022 0505   LDLCALC 112 (H) 05/06/2022 0505   LDLCALC 136 (H) 10/27/2019 0952   HgbA1C  Lab Results  Component Value Date   HGBA1C 9.6 (H) 05/05/2022  Urinalysis    Component Value Date/Time   COLORURINE YELLOW 05/15/2022 1821   APPEARANCEUR HAZY (A) 05/15/2022 1821   LABSPEC 1.017 05/15/2022 1821   PHURINE 7.0 05/15/2022 1821   GLUCOSEU NEGATIVE 05/15/2022 1821   HGBUR NEGATIVE 05/15/2022 1821   BILIRUBINUR NEGATIVE 05/15/2022 1821   KETONESUR NEGATIVE 05/15/2022 1821   PROTEINUR NEGATIVE 05/15/2022 1821   UROBILINOGEN 1.0 06/09/2012 1221   NITRITE NEGATIVE 05/15/2022 1821   LEUKOCYTESUR NEGATIVE 05/15/2022 1821   Urine Drug Screen     Component Value Date/Time   LABOPIA NONE DETECTED 05/05/2022 1152   COCAINSCRNUR NONE DETECTED 05/05/2022 1152   LABBENZ NONE DETECTED 05/05/2022 1152   AMPHETMU NONE DETECTED 05/05/2022 1152   THCU NONE DETECTED 05/05/2022 1152   LABBARB NONE DETECTED 05/05/2022 1152    Alcohol Level    Component Value Date/Time   ETH <10 05/05/2022 1000     SIGNIFICANT DIAGNOSTIC STUDIES DG CHEST PORT 1 VIEW  Result Date:  05/15/2022 CLINICAL DATA:  Fever. EXAM: PORTABLE CHEST 1 VIEW COMPARISON:  May 06, 2022. FINDINGS: Stable cardiomediastinal silhouette. Feeding tube is seen entering stomach. Mild bibasilar subsegmental atelectasis is noted with possible small left pleural effusion. Bony thorax is unremarkable. IMPRESSION: Mild bibasilar subsegmental atelectasis with possible small left pleural effusion. Electronically Signed   By: Marijo Conception M.D.   On: 05/15/2022 15:30   CT HEAD WO CONTRAST (5MM)  Result Date: 05/10/2022 CLINICAL DATA:  Stroke, follow up.  Recent left MCA infarct. EXAM: CT HEAD WITHOUT CONTRAST TECHNIQUE: Contiguous axial images were obtained from the base of the skull through the vertex without intravenous contrast. RADIATION DOSE REDUCTION: This exam was performed according to the departmental dose-optimization program which includes automated exposure control, adjustment of the mA and/or kV according to patient size and/or use of iterative reconstruction technique. COMPARISON:  Head MRI 05/06/2022 FINDINGS: Brain: A large left MCA infarct is unchanged in extent from the prior MRI. There is well-defined cytotoxic edema. Associated confluent petechial hemorrhage in the left basal ganglia is unchanged in extent from the prior MRI. Associated mass effect has at most minimally increased, with rightward midline shift now measuring 4 mm (previously 3 mm). No new infarct, hydrocephalus, or extra-axial fluid collection is evident. Hypodensities elsewhere in the cerebral white matter bilaterally are nonspecific but compatible with chronic small vessel ischemic disease. Vascular: Calcified atherosclerosis at the skull base. Skull: No acute fracture or suspicious osseous lesion. Sinuses/Orbits: Mucosal thickening and/or small volume fluid in the right sphenoid sinus. Clear mastoid air cells. Bilateral cataract extraction. Other: None. IMPRESSION: 1. Unchanged extent of a large left MCA infarct with confluent  petechial hemorrhage in the basal ganglia. 2. Stable to minimally increased mass effect with 4 mm of rightward midline shift. 3. No new intracranial abnormality. Electronically Signed   By: Logan Bores M.D.   On: 05/10/2022 10:46   DG Abd Portable 1V  Result Date: 05/06/2022 CLINICAL DATA:  Feeding tube placement EXAM: PORTABLE ABDOMEN - 1 VIEW COMPARISON:  06/10/2012 FINDINGS: Limited radiograph of the lower chest and upper abdomen was obtained for the purposes of enteric tube localization. Enteric tube is seen coursing below the diaphragm with distal tip terminating within the expected location of the gastric body. IMPRESSION: Enteric tube terminates within the expected location of the gastric body. Electronically Signed   By: Davina Poke D.O.   On: 05/06/2022 15:32   ECHOCARDIOGRAM COMPLETE  Result Date: 05/06/2022    ECHOCARDIOGRAM REPORT   Patient Name:   Nadie  Alleen Borne Date of Exam: 05/06/2022 Medical Rec #:  [id number]        Height:       62.0 in Accession #:    [id number]       Weight:       183.0 lb Date of Birth:  [birth year]        BSA:          1.841 m Patient Age:    89 years         BP:           131/59 mmHg Patient Gender: F                HR:           87 bpm. Exam Location:  Inpatient Procedure: 2D Echo, Cardiac Doppler, Color Doppler and 3D Echo Indications:    Stroke  History:        Patient has prior history of Echocardiogram examinations, most                 recent 11/15/2019. Stroke, Aortic Valve Disease; Risk                 Factors:Hypertension and Diabetes. Aortic stenosis.  Sonographer:    Roseanna Rainbow RDCS Referring Phys: MCNEILL P [facility]  Sonographer Comments: Technically difficult study due to poor echo windows. Patient supine on vent. Patient restricted probe position with left arm. Could not obtain optimal window. IMPRESSIONS  1. Left ventricular ejection fraction, by estimation, is 70 to 75%. The left ventricle has hyperdynamic function. The left ventricle has no regional  wall motion abnormalities. There is moderate asymmetric left ventricular hypertrophy of the basal-septal  segment. Left ventricular diastolic parameters are consistent with Grade II diastolic dysfunction (pseudonormalization). Elevated left atrial pressure.  2. Right ventricular systolic function is normal. The right ventricular size is mildly enlarged. There is normal pulmonary artery systolic pressure.  3. The mitral valve is degenerative. Trivial mitral valve regurgitation. Mild mitral stenosis. The mean mitral valve gradient is 4.0 mmHg with average heart rate of 82 bpm. Severe mitral annular calcification. MVA 1.9 cm^2 by continuity equation  4. The aortic valve is calcified. There is moderate calcification of the aortic valve. Aortic valve regurgitation is trivial. Moderate aortic valve stenosis. Vmax 3.6 m/s, MG 30 mmHg, AVA 1.3 cm^2, DI 0.35  5. The inferior vena cava is normal in size with greater than 50% respiratory variability, suggesting right atrial pressure of 3 mmHg. FINDINGS  Left Ventricle: Left ventricular ejection fraction, by estimation, is 70 to 75%. The left ventricle has hyperdynamic function. The left ventricle has no regional wall motion abnormalities. The left ventricular internal cavity size was normal in size. There is moderate asymmetric left ventricular hypertrophy of the basal-septal segment. Left ventricular diastolic parameters are consistent with Grade II diastolic dysfunction (pseudonormalization). Elevated left atrial pressure. Right Ventricle: The right ventricular size is mildly enlarged. Right vetricular wall thickness was not well visualized. Right ventricular systolic function is normal. There is normal pulmonary artery systolic pressure. The tricuspid regurgitant velocity  is 1.71 m/s, and with an assumed right atrial pressure of 3 mmHg, the estimated right ventricular systolic pressure is [id number] mmHg. Left Atrium: Left atrial size was normal in size. Right Atrium: Right  atrial size was normal in size. Pericardium: There is no evidence of pericardial effusion. Presence of epicardial fat layer. Mitral Valve: The mitral valve is degenerative in appearance. Severe mitral annular calcification. Trivial mitral valve regurgitation. Mild  mitral valve stenosis. MV peak gradient, 13.2 mmHg. The mean mitral valve gradient is 4.0 mmHg with average heart rate of 82 bpm. Tricuspid Valve: The tricuspid valve is normal in structure. Tricuspid valve regurgitation is trivial. Aortic Valve: The aortic valve is calcified. There is moderate calcification of the aortic valve. Aortic valve regurgitation is trivial. Moderate aortic stenosis is present. Aortic valve mean gradient measures 28.5 mmHg. Aortic valve peak gradient measures 47.6 mmHg. Aortic valve area, by VTI measures 1.55 cm. Pulmonic Valve: The pulmonic valve was not well visualized. Pulmonic valve regurgitation is not visualized. Aorta: The aortic root and ascending aorta are structurally normal, with no evidence of dilitation. Venous: The inferior vena cava is normal in size with greater than 50% respiratory variability, suggesting right atrial pressure of 3 mmHg. IAS/Shunts: The interatrial septum was not well visualized.  LEFT VENTRICLE PLAX 2D LVIDd:         4.10 cm   Diastology LVIDs:         2.20 cm   LV e' medial:    4.19 cm/s LV PW:         1.25 cm   LV E/e' medial:  41.8 LV IVS:        1.40 cm   LV e' lateral:   5.70 cm/s LVOT diam:     2.20 cm   LV E/e' lateral: 30.7 LV SV:         97 LV SV Index:   53 LVOT Area:     3.80 cm  RIGHT VENTRICLE             IVC RV S prime:     14.20 cm/s  IVC diam: 2.00 cm TAPSE (M-mode): 1.7 cm LEFT ATRIUM             Index        RIGHT ATRIUM           Index LA diam:        3.40 cm 1.85 cm/m   RA Area:     18.50 cm LA Vol (A2C):   69.5 ml 37.76 ml/m  RA Volume:   49.10 ml  26.67 ml/m LA Vol (A4C):   38.0 ml 20.64 ml/m LA Biplane Vol: 54.0 ml 29.34 ml/m  AORTIC VALVE AV Area (Vmax):    1.47  cm AV Area (Vmean):   1.42 cm AV Area (VTI):     1.55 cm AV Vmax:           345.00 cm/s AV Vmean:          238.200 cm/s AV VTI:            0.626 m AV Peak Grad:      47.6 mmHg AV Mean Grad:      28.5 mmHg LVOT Vmax:         133.50 cm/s LVOT Vmean:        89.150 cm/s LVOT VTI:          0.256 m LVOT/AV VTI ratio: 0.41  AORTA Ao Root diam: 3.20 cm Ao Asc diam:  3.10 cm MITRAL VALVE                TRICUSPID VALVE MV Area (PHT): 3.77 cm     TR Peak grad:   11.7 mmHg MV Area VTI:   2.13 cm     TR Vmax:        171.00 cm/s MV Peak grad:  13.2 mmHg MV Mean grad:  4.0  mmHg     SHUNTS MV Vmax:       1.82 m/s     Systemic VTI:  0.26 m MV Vmean:      99.0 cm/s    Systemic Diam: 2.20 cm MV Decel Time: 201 msec MV E velocity: 175.00 cm/s MV A velocity: 116.00 cm/s MV E/A ratio:  1.51 Oswaldo Milian MD Electronically signed by Oswaldo Milian MD Signature Date/Time: 05/06/2022/3:28:52 PM    Final    MR BRAIN WO CONTRAST  Result Date: 05/06/2022 CLINICAL DATA:  Neuro deficit, acute, stroke suspected. Status post revascularization of left M1 occlusion yesterday. EXAM: MRI HEAD WITHOUT CONTRAST TECHNIQUE: Multiplanar, multiecho pulse sequences of the brain and surrounding structures were obtained without intravenous contrast. COMPARISON:  Head CT and CTA 05/05/2022.  Head MRI 06/10/2012. FINDINGS: A coronal T2 sequence is not performed. Brain: There is a large acute left MCA infarct involving the insula, basal ganglia, and portions of the frontal, parietal, and temporal lobes. The greatest involvement is at the level of the operculum and basal ganglia. There is associated cytotoxic edema with mild regional mass effect including mild ventricular effacement and 3 mm of rightward midline shift. There is associated petechial hemorrhage including extensive, confluent petechial hemorrhage throughout the basal ganglia (Heidelberg classification 1b). Patchy T2 hyperintensities elsewhere in the cerebral white matter  bilaterally are nonspecific but compatible with moderate chronic small vessel ischemic disease. Mild chronic small vessel ischemia is noted in the pons. Mild cerebral atrophy is within normal limits for age. No mass or extra-axial fluid collection is evident. Vascular: Major intracranial vascular flow voids are preserved. Skull and upper cervical spine: Unremarkable bone marrow signal. Sinuses/Orbits: Bilateral cataract extraction. Mild mucosal thickening in the paranasal sinuses. Trace right mastoid fluid. Other: None. IMPRESSION: 1. Large acute left MCA infarct. Mild mass effect with 3 mm of rightward midline shift. 2. Moderate chronic small vessel ischemic disease. Electronically Signed   By: Logan Bores M.D.   On: 05/06/2022 14:06   DG CHEST PORT 1 VIEW  Result Date: 05/06/2022 CLINICAL DATA:  Acute hypercapnic respiratory failure. EXAM: PORTABLE CHEST 1 VIEW COMPARISON:  May 05, 2022. FINDINGS: The heart size and mediastinal contours are within normal limits. Both lungs are clear. The visualized skeletal structures are unremarkable. IMPRESSION: No active disease. Electronically Signed   By: Marijo Conception M.D.   On: 05/06/2022 09:25   IR PERCUTANEOUS ART THROMBECTOMY/INFUSION INTRACRANIAL INC DIAG ANGIO  Result Date: 05/06/2022 INDICATION: New onset right-sided weakness. Occluded left middle cerebral artery M1 segment on CT angiogram of the head and neck. EXAM: 1. EMERGENT LARGE VESSEL OCCLUSION THROMBOLYSIS (anterior CIRCULATION) COMPARISON:  CT angiogram of the head and neck of May 05, 2022. MEDICATIONS: Ancef 2 g IV antibiotic was administered within 1 hour of the procedure. ANESTHESIA/SEDATION: General anesthesia. CONTRAST:  Omnipaque 300 50 mL. FLUOROSCOPY TIME:  Fluoroscopy Time: 8 minutes 24 seconds (890 mGy). COMPLICATIONS: None immediate. TECHNIQUE: Following a full explanation of the procedure along with the potential associated complications, an informed witnessed consent was  obtained. The risks of intracranial hemorrhage of 10%, worsening neurological deficit, ventilator dependency, death and inability to revascularize were all reviewed in detail with the patient's son. The patient was then put under general anesthesia by the Department of Anesthesiology at [facility]. The right groin was prepped and draped in the usual sterile fashion. Thereafter using modified Seldinger technique, transfemoral access into the right common femoral artery was obtained without difficulty. Over a 0.035 inch guidewire an  8 French Pinnacle sheath was inserted. Through this, and also over a 0.035 inch guidewire a combination of a 5 French 125 cm Simmons 2 support catheter inside of an 087 95 cm balloon guide catheter was advanced to the aortic arch, and selectively positioned in the mid left common carotid artery. An arteriogram was then performed centered extra cranially and intracranially. The combination was then advanced to the distal cervical left ICA. The support Simmons 2 catheter, and the guidewire were removed. Good aspiration obtained from the hub of the balloon guide catheter. A gentle control arteriogram performed through the balloon guide catheter demonstrated no evidence of spasms, dissections or of intraluminal filling defects. FINDINGS: Intracranially an the occluded left M1 segment was confirmed. Patency of the left anterior cerebral artery intact. Left external carotid artery origin and branches demonstrate wide patency. PROCEDURE: Through the balloon guide catheter in the distal cervical left ICA, a combination of an 070 aspiration catheter with its inner support catheter were advanced using biplane roadmap technique and constant fluoroscopic guidance to the supraclinoid left ICA. Combination was then gently advanced into the occluded left middle cerebral artery distal M1 segment followed by the aspiration catheter. The inner support catheter was removed, and the 070 aspiration  catheter was engaged into the M1 clot. Constant aspiration was then applied at the hub of the aspiration catheter with the Penumbra aspiration device and also at the hub of the balloon guide catheter with proximal flow arrest for a minute and a half. Thereafter, the combination of the 070 aspiration catheter was retrieved and removed. Following reversal of flow arrest in the left internal carotid artery, a control arteriogram performed demonstrated complete revascularization of the left MCA distribution achieving a TICI 3 revascularization. The left anterior cerebral artery remained widely patent. Mid cervical left ICA spasm was treated with 3 aliquots of 25 mcg of nitroglycerin with relief. A final control arteriogram performed through the balloon guide catheter in the left common carotid artery demonstrate wide patency of the left internal carotid artery extra cranially and intracranially. The left MCA maintained a TICI 3 revascularization. The balloon catheter was removed. An 8 French Angio-Seal closure device was deployed at the right groin puncture site. Distal pulses remained Dopplerable unchanged. Flat panel CT of the brain demonstrated no evidence of intracranial hemorrhage. Mild contrast stain of the posterior aspect of the left putamen was noted. Patient was left intubated due to her medical condition. She was transferred to the neuro ICU for post revascularization management. IMPRESSION: Status post complete revascularization of occluded left middle cerebral M1 segment with 1 pass with 070 FreeClimb aspiration catheter with proximal flow arrest retrieving a TICI 3 revascularization. PLAN: As per referring MD. Electronically Signed   By: Luanne Bras M.D.   On: 05/06/2022 08:09   IR CT Head Ltd  Result Date: 05/06/2022 INDICATION: New onset right-sided weakness. Occluded left middle cerebral artery M1 segment on CT angiogram of the head and neck. EXAM: 1. EMERGENT LARGE VESSEL OCCLUSION  THROMBOLYSIS (anterior CIRCULATION) COMPARISON:  CT angiogram of the head and neck of May 05, 2022. MEDICATIONS: Ancef 2 g IV antibiotic was administered within 1 hour of the procedure. ANESTHESIA/SEDATION: General anesthesia. CONTRAST:  Omnipaque 300 50 mL. FLUOROSCOPY TIME:  Fluoroscopy Time: 8 minutes 24 seconds (890 mGy). COMPLICATIONS: None immediate. TECHNIQUE: Following a full explanation of the procedure along with the potential associated complications, an informed witnessed consent was obtained. The risks of intracranial hemorrhage of 10%, worsening neurological deficit, ventilator dependency, death and  inability to revascularize were all reviewed in detail with the patient's son. The patient was then put under general anesthesia by the Department of Anesthesiology at [facility]. The right groin was prepped and draped in the usual sterile fashion. Thereafter using modified Seldinger technique, transfemoral access into the right common femoral artery was obtained without difficulty. Over a 0.035 inch guidewire an 8 [place] Pinnacle sheath was inserted. Through this, and also over a 0.035 inch guidewire a combination of a 5 French 125 cm Simmons 2 support catheter inside of an 087 95 cm balloon guide catheter was advanced to the aortic arch, and selectively positioned in the mid left common carotid artery. An arteriogram was then performed centered extra cranially and intracranially. The combination was then advanced to the distal cervical left ICA. The support Simmons 2 catheter, and the guidewire were removed. Good aspiration obtained from the hub of the balloon guide catheter. A gentle control arteriogram performed through the balloon guide catheter demonstrated no evidence of spasms, dissections or of intraluminal filling defects. FINDINGS: Intracranially an the occluded left M1 segment was confirmed. Patency of the left anterior cerebral artery intact. Left external carotid artery origin and  branches demonstrate wide patency. PROCEDURE: Through the balloon guide catheter in the distal cervical left ICA, a combination of an 070 aspiration catheter with its inner support catheter were advanced using biplane roadmap technique and constant fluoroscopic guidance to the supraclinoid left ICA. Combination was then gently advanced into the occluded left middle cerebral artery distal M1 segment followed by the aspiration catheter. The inner support catheter was removed, and the 070 aspiration catheter was engaged into the M1 clot. Constant aspiration was then applied at the hub of the aspiration catheter with the Penumbra aspiration device and also at the hub of the balloon guide catheter with proximal flow arrest for a minute and a half. Thereafter, the combination of the 070 aspiration catheter was retrieved and removed. Following reversal of flow arrest in the left internal carotid artery, a control arteriogram performed demonstrated complete revascularization of the left MCA distribution achieving a TICI 3 revascularization. The left anterior cerebral artery remained widely patent. Mid cervical left ICA spasm was treated with 3 aliquots of 25 mcg of nitroglycerin with relief. A final control arteriogram performed through the balloon guide catheter in the left common carotid artery demonstrate wide patency of the left internal carotid artery extra cranially and intracranially. The left MCA maintained a TICI 3 revascularization. The balloon catheter was removed. An 8 French Angio-Seal closure device was deployed at the right groin puncture site. Distal pulses remained Dopplerable unchanged. Flat panel CT of the brain demonstrated no evidence of intracranial hemorrhage. Mild contrast stain of the posterior aspect of the left putamen was noted. Patient was left intubated due to her medical condition. She was transferred to the neuro ICU for post revascularization management. IMPRESSION: Status post complete  revascularization of occluded left middle cerebral M1 segment with 1 pass with 070 FreeClimb aspiration catheter with proximal flow arrest retrieving a TICI 3 revascularization. PLAN: As per referring MD. Electronically Signed   By: Luanne Bras M.D.   On: 05/06/2022 08:09   IR ANGIO INTRA EXTRACRAN SEL INTERNAL CAROTID UNI L MOD SED  Result Date: 05/06/2022 INDICATION: New onset right-sided weakness. Occluded left middle cerebral artery M1 segment on CT angiogram of the head and neck. EXAM: 1. EMERGENT LARGE VESSEL OCCLUSION THROMBOLYSIS (anterior CIRCULATION) COMPARISON:  CT angiogram of the head and neck of May 05, 2022.  MEDICATIONS: Ancef 2 g IV antibiotic was administered within 1 hour of the procedure. ANESTHESIA/SEDATION: General anesthesia. CONTRAST:  Omnipaque 300 50 mL. FLUOROSCOPY TIME:  Fluoroscopy Time: 8 minutes 24 seconds (890 mGy). COMPLICATIONS: None immediate. TECHNIQUE: Following a full explanation of the procedure along with the potential associated complications, an informed witnessed consent was obtained. The risks of intracranial hemorrhage of 10%, worsening neurological deficit, ventilator dependency, death and inability to revascularize were all reviewed in detail with the patient's son. The patient was then put under general anesthesia by the Department of Anesthesiology at [facility]. The right groin was prepped and draped in the usual sterile fashion. Thereafter using modified Seldinger technique, transfemoral access into the right common femoral artery was obtained without difficulty. Over a 0.035 inch guidewire an 8 [place] Pinnacle sheath was inserted. Through this, and also over a 0.035 inch guidewire a combination of a 5 French 125 cm Simmons 2 support catheter inside of an 087 95 cm balloon guide catheter was advanced to the aortic arch, and selectively positioned in the mid left common carotid artery. An arteriogram was then performed centered extra cranially  and intracranially. The combination was then advanced to the distal cervical left ICA. The support Simmons 2 catheter, and the guidewire were removed. Good aspiration obtained from the hub of the balloon guide catheter. A gentle control arteriogram performed through the balloon guide catheter demonstrated no evidence of spasms, dissections or of intraluminal filling defects. FINDINGS: Intracranially an the occluded left M1 segment was confirmed. Patency of the left anterior cerebral artery intact. Left external carotid artery origin and branches demonstrate wide patency. PROCEDURE: Through the balloon guide catheter in the distal cervical left ICA, a combination of an 070 aspiration catheter with its inner support catheter were advanced using biplane roadmap technique and constant fluoroscopic guidance to the supraclinoid left ICA. Combination was then gently advanced into the occluded left middle cerebral artery distal M1 segment followed by the aspiration catheter. The inner support catheter was removed, and the 070 aspiration catheter was engaged into the M1 clot. Constant aspiration was then applied at the hub of the aspiration catheter with the Penumbra aspiration device and also at the hub of the balloon guide catheter with proximal flow arrest for a minute and a half. Thereafter, the combination of the 070 aspiration catheter was retrieved and removed. Following reversal of flow arrest in the left internal carotid artery, a control arteriogram performed demonstrated complete revascularization of the left MCA distribution achieving a TICI 3 revascularization. The left anterior cerebral artery remained widely patent. Mid cervical left ICA spasm was treated with 3 aliquots of 25 mcg of nitroglycerin with relief. A final control arteriogram performed through the balloon guide catheter in the left common carotid artery demonstrate wide patency of the left internal carotid artery extra cranially and intracranially.  The left MCA maintained a TICI 3 revascularization. The balloon catheter was removed. An 8 French Angio-Seal closure device was deployed at the right groin puncture site. Distal pulses remained Dopplerable unchanged. Flat panel CT of the brain demonstrated no evidence of intracranial hemorrhage. Mild contrast stain of the posterior aspect of the left putamen was noted. Patient was left intubated due to her medical condition. She was transferred to the neuro ICU for post revascularization management. IMPRESSION: Status post complete revascularization of occluded left middle cerebral M1 segment with 1 pass with 070 FreeClimb aspiration catheter with proximal flow arrest retrieving a TICI 3 revascularization. PLAN: As per referring MD. Electronically Signed  By: Luanne Bras M.D.   On: 05/06/2022 08:09   DG CHEST PORT 1 VIEW  Result Date: 05/05/2022 CLINICAL DATA:  Heart failure EXAM: PORTABLE CHEST 1 VIEW COMPARISON:  10/14/2017 FINDINGS: Endotracheal tube terminates 3.0 cm above the carina. Mild cardiomegaly. Aortic atherosclerosis. Streaky left basilar opacity with probable small left pleural effusion. No pneumothorax. IMPRESSION: 1. Endotracheal tube terminates 3.0 cm above the carina. 2. Streaky left basilar opacity with probable small left pleural effusion. Electronically Signed   By: Davina Poke D.O.   On: 05/05/2022 12:21   CT ANGIO HEAD NECK W WO CM W PERF (CODE STROKE)  Result Date: 05/05/2022 CLINICAL DATA:  Stroke suspected EXAM: CT ANGIOGRAPHY HEAD AND NECK CT PERFUSION BRAIN TECHNIQUE: Multidetector CT imaging of the head and neck was performed using the standard protocol during bolus administration of intravenous contrast. Multiplanar CT image reconstructions and MIPs were obtained to evaluate the vascular anatomy. Carotid stenosis measurements (when applicable) are obtained utilizing NASCET criteria, using the distal internal carotid diameter as the denominator. Multiphase CT imaging  of the brain was performed following IV bolus contrast injection. Subsequent parametric perfusion maps were calculated using RAPID software. RADIATION DOSE REDUCTION: This exam was performed according to the departmental dose-optimization program which includes automated exposure control, adjustment of the mA and/or kV according to patient size and/or use of iterative reconstruction technique. CONTRAST:  64m OMNIPAQUE IOHEXOL 350 MG/ML SOLN COMPARISON:  Same day CT head FINDINGS: CT HEAD FINDINGS See same day CT brain for findings left MCA territory infarct. CTA NECK FINDINGS Aortic arch: Standard branching. Imaged portion shows no evidence of aneurysm or dissection. No significant stenosis of the major arch vessel origins. There is nonspecific irregular narrowing of the distal left subclavian/proximal axillary artery. Right carotid system: No evidence of dissection, stenosis (50% or greater) or occlusion. Left carotid system: No evidence of dissection, stenosis (50% or greater) or occlusion. Vertebral arteries: Severe stenosis of the V3 V4 junction of the left vertebral artery (series 8, image 142). Skeleton: Multilevel degenerative changes in the cervical spine. Other neck: Carious dentition multiple dental caries and periapical lucencies. No evidence of an odontogenic soft tissue abscess. Patient is edentulous along the maxillary arch. Upper chest: Biapical bronchial wall thickening as can be seen in the setting of small airways disease or bronchitis. Review of the MIP images confirms the above findings CTA HEAD FINDINGS Anterior circulation: Common origin of the bilateral A2 segments with the A1 segment arising from the [place] There is an acute occlusion of the proximal to mid portion of the left M1 (series 8, image 85). There is poor opacification of the M2 segments in the left MCA territory, which may be secondary to decreased flow in the setting of nearly occlusive thrombus or additional small scattered  thrombi in the left MCA territory. Mild-to-moderate narrowing is also present in the distal M1 segment of the right MCA. Posterior circulation: There is severe stenosis of the vertebrobasilar junction and in the proximal basilar artery. The P1 segment right PCA small in caliber. There are multifocal stenoses in the bilateral PCA territory of the P2 and P3 segments. Venous sinuses: Not well opacified Anatomic variants: None Review of the MIP images confirms the above findings CT Brain Perfusion Findings: ASPECTS: 5-6 CBF (<30%) Volume: 351mPerfusion (Tmax>6.0s) volume: 11036mismatch Volume: 3.1mL109mfarction Location:Left MCA territory IMPRESSION: 1. Acute occlusion of the proximal to mid portion of the left M1 segment. Poor opacification of the M2 segments in the left MCA territory  may be secondary to decreased flow in the setting of occlusive thrombus versus additional small scattered thrombi in the distal left MCA territory. Perfusion findings, as above. 2. 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 3. Multifocal mild to moderate stenoses in the bilateral PCA territory. 4. Severe stenosis at the V3/V4 junction of the left vertebral artery. 5. No hemodynamically significant stenosis in the neck. Findings were paged to Dr. Leonel Ramsay on 05/05/22 at 9:36 AM via [facility] paging system. Electronically Signed   By: Marin Roberts M.D.   On: 05/05/2022 09:59   CT HEAD CODE STROKE WO CONTRAST  Result Date: 05/05/2022 CLINICAL DATA:  Code stroke.  Left facial droop EXAM: CT HEAD WITHOUT CONTRAST TECHNIQUE: Contiguous axial images were obtained from the base of the skull through the vertex without intravenous contrast. RADIATION DOSE REDUCTION: This exam was performed according to the departmental dose-optimization program which includes automated exposure control, adjustment of the mA and/or kV according to patient size and/or use of iterative reconstruction technique. COMPARISON:  CT Head  01/23/22 FINDINGS: Brain: There is an acute infarct in the left MCA territory involving the left lentiform nucleus, left insula and M1-M3 cortices. Aspects is 5-6. No hemorrhage. No hydrocephalus. No extra-axial fluid collection. No mass lesion. Vascular: See same day CT head and neck angiogram for additional findings. No hyperdense vessel is visualized. Skull: Normal. Negative for fracture or focal lesion. Sinuses/Orbits: No mastoid or middle ear effusion. There are frothy secretions in the sphenoid sinus, which can be seen in the setting acute sinusitis. Bilateral lens replacement. Orbits are otherwise unremarkable. Other: None. ASPECTS [facility] Stroke Program Early CT Score) - Ganglionic level infarction (caudate, lentiform nuclei, internal capsule, insula, M1-M3 cortex): 3 - Supraganglionic infarction (M4-M6 cortex): 2-3 Total score (0-10 with 10 being normal): 5-6 IMPRESSION: Acute infarct in the left MCA territory. Aspects is 5-6. No hemorrhage. Findings were paged to Dr. Leonel Ramsay on 05/05/22 at 9:36 AM via [facility] paging system. Electronically Signed   By: Marin Roberts M.D.   On: 05/05/2022 09:44      HISTORY OF PRESENT ILLNESS Patient with history of diabetes, emphysema and hypertension presented with right-sided weakness.  HOSPITAL COURSE Patient presented outside the window for TNK and was taken to interventional radiology for mechanical thrombectomy of occluded left MCA.TICI 3 flow was achieved after 1 pass.  Some hemorrhagic transformation was noted on MRI the next day.  Patient continued to have deficits of right-sided weakness and aphasia, and goals of care conversations were had with family and palliative care.  Eventually, family decided that continued aggressive measures would not be in accordance with patient's previously stated wishes and compassionate extubation was performed.  Patient was extubated and is now ready to be discharged to hospice facility.  Stroke:  large left MCA stroke  with mild HT due to left M1 occlusion s/p IR with TICI3, etiology possibly large vessel stenosis, however, cardioembolic source can not be excluded. CT head acute infarct in the left MCA territory ASPECTS 5-6 CTA head & neck acute occlusion of left M1 segment with poor opacifications of M2 segments, severe stenosis of vertebrobasilar junction and proximal basilar artery, multifocal mild to moderate stenoses in bilateral PCA territory, severe stenosis at left V3 V4 junction MRI left MCA territory infarct with hemorrhagic transformation CT repeat 2/11 stable large left MCA infarct with confluent petechial hemorrhage in the BG, stable 109m rightward MLS. 2D Echo EF 70 to 75%  LDL 112 HgbA1c 9.6 VTE prophylaxis - was  on Lovenox   No antithrombotic prior to admission, was on ASA 325. Now in comfort care Disposition: family requested Comfort care and hospice facility in [place]   Respiratory failure ? Aspiration penumonia Patient left intubated after procedure Ventilator management per CCM Was on weaning Tmax of 101.4-> 102.2 -> 100.4->afebrile Was on Unasyn  UA negative No leukocytosis One way extubation 2/17 for comfort   Hypertension Home meds: Lisinopril 20 mg daily Stable Was on coreg 12.5, lisnopril 20   Hyperlipidemia Home meds: None LDL 112, goal < 70   Diabetes type II Uncontrolled Home meds: Glipizide 10 mg twice daily HgbA1c 9.6    Dysphagia  Off TF  for comfort   Other Stroke Risk Factors Advanced Age >/= 40  Obesity, Body mass index is 31.37 kg/m., BMI >/= 30 associated with increased stroke risk, recommend weight loss, diet and exercise as appropriate    Other Active Problems Emphysema  RN Pressure Injury Documentation: Pressure Injury 05/05/22 Buttocks Left Stage 2 -  Partial thickness loss of dermis presenting as a shallow open injury with a red, pink wound bed without slough. Open sore on buttocks on admission with edges of blister rolled up (Active)   05/05/22 1130  Location: Buttocks  Location Orientation: Left  Staging: Stage 2 -  Partial thickness loss of dermis presenting as a shallow open injury with a red, pink wound bed without slough.  Wound Description (Comments): Open sore on buttocks on admission with edges of blister rolled up  Present on Admission:   Dressing Type Foam - Lift dressing to assess site every shift 05/16/22 2000     DISCHARGE EXAM Blood pressure (!) 118/42, pulse 62, temperature 98.7 F (37.1 C), temperature source Axillary, resp. rate (!) 22, height 5' 2"$  (1.575 m), weight 77.8 kg, SpO2 100 %. General ill: appearing elderly patient in no acute distress Respiratory: Regular, unlabored respirations  Neurological: limited exam due to comfort care measures. Patient does not respond to voice or light touch. Eyes closed, nonverbal or following commands, no limb movement during my encounter   Discharge Diet       Diet   Diet NPO time specified   liquids  DISCHARGE PLAN Disposition: To inpatient hospice No antithrombotic for secondary stroke prevention  Continuing care per inpatient hospice facility  35 minutes were spent preparing discharge.  [place] , MSN, AGACNP-BC [facility] See Amion for schedule and pager information 05/17/2022 1:53 PM  ATTENDING NOTE: I reviewed above note and agree with the assessment and plan. Pt was seen and examined.   Discussed with SW, palliative care service and talked with pt daughter at the bedside. Pt will be transport to [place] facility today.   For detailed assessment and plan, please refer to above/below as I have made changes wherever appropriate.   Rosalin Hawking, MD PhD Stroke Neurology 05/17/2022 2:24 PM

## 2022-05-17 NOTE — TOC Transition Note (Signed)
Transition of Care [facility]) - CM/SW Discharge Note   Patient Details  Name: [name] MRN: [id number] Date of Birth: 20-Mar-1935  [facility] CM/SW Contact:  Amador Cunas, [place] Phone Number: 05/17/2022, 1:59 PM   Clinical Narrative:  pt has been accepted to the [facility] and they are prepared to admit today. Pt's dtr Joanne Chars aware and has completed admission paperwork. RN provided with number for report and PTAR arranged for transport. SW signing off at dc.   Wandra Feinstein, MSW, LCSW [phone] (coverage)      Final next level of care: [place] Barriers to Discharge: No Barriers Identified   Patient Goals and CMS Choice CMS Medicare.gov Compare Post Acute Care list provided to:: Other (Comment Required) (adult children) Choice offered to / list presented to : Adult Children  Discharge Placement                Patient chooses bed at:  ([place]) Patient to be transferred to facility by: [facility] Name of family member notified: Freda/dtr Patient and family notified of of transfer: 05/17/22  Discharge Plan and Services Additional resources added to the After Visit Summary for                                       Social Determinants of Health (SDOH) Interventions SDOH Screenings   Tobacco Use: Low Risk  (05/07/2022)     Readmission Risk Interventions     No data to display

## 2022-05-17 NOTE — Progress Notes (Signed)
   Palliative Medicine Inpatient Follow Up Note  Was asked by 2W nursing staff to come to bedside and two of patients daughters were refusing comfort care and wanted to revoke patients DNR.  I went to bedside and was met by patients daughters, [name] and [name]. They share that they were told the patient could not eat and that we were starving her. I shared that this is not the case by any means in fact, should [name] arouse enough to eat and drink we should allow her to have whatever she would enjoy. I educated them on the transitions our body takes as we are dying and how we no longer feel the want, need, or desire to eat/drink.  In addition we discussed mouth swabs and I was able to provide these with both ice water and chicken brother.  I spent time explaining the patients stroke, severity, and reasoning why patients recovery is unlikely.   Family understood.   We reconfirmed the plan for transition to [facility].   Plan for discharge later today.  Additional Time: 17 ______________________________________________________________________________________ [place] Team Team Cell Phone: [phone] Please utilize secure chat with additional questions, if there is no response within 30 minutes please call the above phone number  Palliative Medicine Team providers are available by phone from 7am to 7pm daily and can be reached through the team cell phone.  Should this patient require assistance outside of these hours, please call the patient's attending physician.
